# Patient Record
Sex: Male | Born: 1940
Health system: Southern US, Community
[De-identification: ages and names within clinical notes are randomized; demographics above are authoritative.]

## PROBLEM LIST (undated history)

## (undated) DIAGNOSIS — D573 Sickle-cell trait: Secondary | ICD-10-CM

## (undated) DIAGNOSIS — C61 Malignant neoplasm of prostate: Secondary | ICD-10-CM

## (undated) DIAGNOSIS — R42 Dizziness and giddiness: Secondary | ICD-10-CM

## (undated) DIAGNOSIS — I10 Essential (primary) hypertension: Secondary | ICD-10-CM

## (undated) HISTORY — PX: NECK SURGERY: SHX720

## (undated) HISTORY — PX: OTHER SURGICAL HISTORY: SHX169

## (undated) HISTORY — DX: Malignant neoplasm of prostate: C61

---

## 2005-06-14 ENCOUNTER — Ambulatory Visit: Payer: Self-pay | Admitting: Family Medicine

## 2005-06-14 ENCOUNTER — Inpatient Hospital Stay (HOSPITAL_COMMUNITY): Admission: EM | Admit: 2005-06-14 | Discharge: 2005-06-20 | Payer: Self-pay | Admitting: Emergency Medicine

## 2005-06-19 ENCOUNTER — Ambulatory Visit: Payer: Self-pay | Admitting: Internal Medicine

## 2005-06-22 ENCOUNTER — Ambulatory Visit: Payer: Self-pay | Admitting: Family Medicine

## 2005-06-26 ENCOUNTER — Ambulatory Visit: Payer: Self-pay | Admitting: *Deleted

## 2005-07-27 ENCOUNTER — Encounter: Admission: RE | Admit: 2005-07-27 | Discharge: 2005-07-27 | Payer: Self-pay | Admitting: Orthopedic Surgery

## 2006-01-15 ENCOUNTER — Ambulatory Visit: Payer: Self-pay | Admitting: Family Medicine

## 2006-01-16 ENCOUNTER — Emergency Department (HOSPITAL_COMMUNITY): Admission: EM | Admit: 2006-01-16 | Discharge: 2006-01-17 | Payer: Self-pay | Admitting: Emergency Medicine

## 2009-10-04 ENCOUNTER — Emergency Department (HOSPITAL_COMMUNITY): Admission: EM | Admit: 2009-10-04 | Discharge: 2009-10-04 | Payer: Self-pay | Admitting: Emergency Medicine

## 2010-09-08 NOTE — Consult Note (Signed)
NAME:  Carlos Harper, CHRISTMAS NO.:  [id number]   MEDICAL RECORD NO.:  [id number]          PATIENT TYPE:  INP   LOCATION:  1823                         FACILITY:  [facility]   PHYSICIAN:  Myrtie Neither, MD      DATE OF BIRTH:  May 17, 1940   DATE OF CONSULTATION:  06/14/2005  DATE OF DISCHARGE:                                   CONSULTATION   REASON FOR CONSULTATION:  Osteomyelitis, drainage from left lower leg.   PERTINENT HISTORY:  This is a 70 year old black male who had undergone ORIF  of the left distal tibia and fibula fracture 14 months ago in [place]. The  patient states that early on in his treatment he had problems with drainage  from a pinpoint area of his leg with dark discoloration. The patient states  he was never put on antibiotics during this period. He continues to have  recurrent  problems with the area breaking open with pain, swelling and  fever and drainage and attempted to clear up. The patient denies any fever,  chills or night sweats. The patient presently is in [place] and reported  to [facility] emergency room for treatment. The patient's last episode of  drainage was this past Monday.   PAST MEDICAL HISTORY:  1.  ORIF of the left tibial plateau 24 years ago.  2.  ORIF left tibia, fibula 14 months ago.  3.  No history of high blood pressure or diabetes.   ALLERGIES:  NONE KNOWN.   MEDICATIONS:  None.   SOCIAL HISTORY:  Negative. No use of alcohol or tobacco.   REVIEW OF SYSTEMS:  Basically just that of history of present illness. No  cardiac, respiratory, no urinary or bowel symptoms.   FAMILY HISTORY:  High blood pressure.   PHYSICAL EXAMINATION:  GENERAL: Alert and oriented, in no acute distress.  VITAL SIGNS: Temperature 97.3, pulse 63, respirations 20. Blood pressure  162/90.  EXTREMITIES: Left leg with old multiple scars laterally as well as  anteriorly. A tender pinpoint area wound which is sealed off but still  tender and slightly  puffy, dark discoloration, along the mid shaft in the  distal third of the tibia medially. Pulses intact. Negative Homans test.   X-ray revealed tibial plateau plate with multiple screws mid tibial shaft  with fifth screw from the top with ectopic bone over it and some periosteal  reaction to any area. Good callus formation with 1 slit at the fracture site  which has not healed in.   IMPRESSION:  Osteomyelitis draining wound left lower leg.   RECOMMENDATIONS:  It appears that the screw with ectopic bone may be keeping  the area open. Recommend removing the screw and doing culture and  sensitivity prior to antibiotics being given, to get a handle on which  bacteria is causing the infection.   PLAN:  Schedule patient for surgery tomorrow evening. He is to be n.p.o.  after breakfast. The patient also has not had a PSA or rectal exam in 15  years. We will order a PSA level and C-reactive protein.  Myrtie Neither, MD  Electronically Signed     AC/MEDQ  D:  06/14/2005  T:  06/15/2005  Job:  [id number]
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
## 2010-09-08 NOTE — Op Note (Signed)
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   POSTOPERATIVE DIAGNOSIS:  Osteomyelitis of left lower leg with hardware,  left lower leg.   OPERATION PERFORMED:  Incision and drainage, debridement, curettage and  removal of hardware right lower leg, culture and sensitivity aerobic and  anaerobic and Gram stains were done.   SURGEON:  Myrtie Neither, MD   ANESTHESIA:  General.   DESCRIPTION OF PROCEDURE:  The patient was taken to the operating room after  given adequate preop medication and given general anesthesia and intubated.  The right lower lobe was prepped with DuraPrep and draped in sterile manner.  Tourniquet used for hemostasis.  Anterolateral incision was made going  through the old previous scar of the left lower leg, down through the skin  and subcutaneous tissue.  Thick fibrotic scar was debrided from the area  down to the bone.  Plate was identified, multiple screws, 9 screws were  removed.  An area of excess bone formation noted on the x-ray was identified  and this was curetted and cultures were done.  After adequate debridement,  copious irrigation was done with Simpulse followed by use of antibiotic  solution.  Wound closure was then done with 2-0 Vicryl for the subcutaneous  and skin staples for the skin.  Bulky compressive dressing was applied.  Cam  walker applied.  The patient tolerated the procedure quite well and went to  the recovery room in stable satisfactory condition.      Myrtie Neither, MD  Electronically Signed     AC/MEDQ  D:  08/15/2005  T:   08/16/2005  Job:  [id number]
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
## 2010-09-08 NOTE — Op Note (Signed)
NAME:  Carlos Harper, Carlos Harper NO.:  [id number]   MEDICAL RECORD NO.:  [id number]          PATIENT TYPE:  INP   LOCATION:  5502                         FACILITY:  [facility]   PHYSICIAN:  Myrtie Neither, MD      DATE OF BIRTH:  04-Nov-1940   DATE OF PROCEDURE:  07/31/2005  DATE OF DISCHARGE:  06/20/2005                                 OPERATIVE REPORT   PREOPERATIVE DIAGNOSIS:  Osteomyelitis, left lower leg.  Hardware, left  lower leg.   POSTOPERATIVE DIAGNOSIS:  Osteomyelitis, left lower leg.  Hardware, left  lower leg.   OPERATION PERFORMED:  Incision, debridement and removal of hardware, left  lower leg.  Plate and screws.   SURGEON:  Myrtie Neither, MD   ANESTHESIA:   DESCRIPTION OF PROCEDURE:  The patient was taken to the operating room after  given adequate preop medications, given general anesthesia and intubated.  Right lower lobe was prepped with DuraPrep and draped in sterile manner.  Tourniquet was used for hemostasis.  Anterior incision going through the old  previous scar to right lower leg was made going through skin and  subcutaneous tissue down to the fascia.  Soft tissue was subperiosteally  elevated from the tibia and the plate.  Cultures of aerobic and anaerobic  were done.  Gram stain was done.  Tibial plate with multiple screws were  removed.  A portion of the tibia with ectopic bone at the small exit wound  through the skin was resected.  Examination of this area also demonstrated  what appeared to be either fine gravel or particle material.  This was  resected from the skin and the bone itself was curetted.  Copious and  abundant irrigation was done followed by closure with 0 Vicryl for the  fascia, 2-0 for the subcutaneous and skin staples for the skin.  Bulky  compressive dressing was applied.  Cam walker was applied.  The patient  tolerated the procedure well.  Went to recovery room in stable and  satisfactory condition.      Myrtie Neither,  MD  Electronically Signed     AC/MEDQ  D:  07/31/2005  T:  07/31/2005  Job:  [id number]

## 2015-05-13 ENCOUNTER — Encounter (HOSPITAL_COMMUNITY): Payer: Self-pay | Admitting: *Deleted

## 2015-05-13 ENCOUNTER — Emergency Department (HOSPITAL_COMMUNITY)
Admission: EM | Admit: 2015-05-13 | Discharge: 2015-05-13 | Disposition: A | Discharge status: Home / Self Care | Payer: Medicaid Other | Attending: Emergency Medicine | Admitting: Emergency Medicine

## 2015-05-13 ENCOUNTER — Emergency Department (HOSPITAL_COMMUNITY): Payer: Medicaid Other

## 2015-05-13 DIAGNOSIS — Z23 Encounter for immunization: Secondary | ICD-10-CM | POA: Diagnosis not present

## 2015-05-13 DIAGNOSIS — Y9289 Other specified places as the place of occurrence of the external cause: Secondary | ICD-10-CM | POA: Diagnosis not present

## 2015-05-13 DIAGNOSIS — Y998 Other external cause status: Secondary | ICD-10-CM | POA: Insufficient documentation

## 2015-05-13 DIAGNOSIS — S62645A Nondisplaced fracture of proximal phalanx of left ring finger, initial encounter for closed fracture: Secondary | ICD-10-CM | POA: Diagnosis not present

## 2015-05-13 DIAGNOSIS — S6992XA Unspecified injury of left wrist, hand and finger(s), initial encounter: Secondary | ICD-10-CM | POA: Diagnosis present

## 2015-05-13 DIAGNOSIS — S62607A Fracture of unspecified phalanx of left little finger, initial encounter for closed fracture: Secondary | ICD-10-CM

## 2015-05-13 DIAGNOSIS — S61213A Laceration without foreign body of left middle finger without damage to nail, initial encounter: Secondary | ICD-10-CM | POA: Insufficient documentation

## 2015-05-13 DIAGNOSIS — S62617A Displaced fracture of proximal phalanx of left little finger, initial encounter for closed fracture: Secondary | ICD-10-CM | POA: Diagnosis not present

## 2015-05-13 DIAGNOSIS — W208XXA Other cause of strike by thrown, projected or falling object, initial encounter: Secondary | ICD-10-CM | POA: Insufficient documentation

## 2015-05-13 DIAGNOSIS — S62635A Displaced fracture of distal phalanx of left ring finger, initial encounter for closed fracture: Secondary | ICD-10-CM

## 2015-05-13 DIAGNOSIS — Y9389 Activity, other specified: Secondary | ICD-10-CM | POA: Insufficient documentation

## 2015-05-13 DIAGNOSIS — S61219A Laceration without foreign body of unspecified finger without damage to nail, initial encounter: Secondary | ICD-10-CM

## 2015-05-13 DIAGNOSIS — IMO0001 Reserved for inherently not codable concepts without codable children: Secondary | ICD-10-CM

## 2015-05-13 HISTORY — DX: Dizziness and giddiness: R42

## 2015-05-13 MED ORDER — HYDROCODONE-ACETAMINOPHEN 5-325 MG PO TABS
1.0000 | ORAL_TABLET | Freq: Once | ORAL | Status: AC
  Administered 2015-05-13: 1 via ORAL
  Filled 2015-05-13: qty 1

## 2015-05-13 MED ORDER — LIDOCAINE HCL (PF) 2 % IJ SOLN
10.0000 mL | Freq: Once | INTRAMUSCULAR | Status: DC
  Filled 2015-05-13: qty 10

## 2015-05-13 MED ORDER — TETANUS-DIPHTH-ACELL PERTUSSIS 5-2.5-18.5 LF-MCG/0.5 IM SUSP
0.5000 mL | Freq: Once | INTRAMUSCULAR | Status: AC
  Administered 2015-05-13: 0.5 mL via INTRAMUSCULAR
  Filled 2015-05-13: qty 0.5

## 2015-05-13 MED ORDER — CEPHALEXIN 500 MG PO CAPS
500.0000 mg | ORAL_CAPSULE | Freq: Once | ORAL | Status: AC
  Administered 2015-05-13: 500 mg via ORAL
  Filled 2015-05-13: qty 1

## 2015-05-13 MED ORDER — POVIDONE-IODINE 10 % EX SOLN
CUTANEOUS | Status: AC
  Filled 2015-05-13: qty 118

## 2015-05-13 MED ORDER — HYDROCODONE-ACETAMINOPHEN 5-325 MG PO TABS: ORAL_TABLET | ORAL | Status: DC

## 2015-05-13 MED ORDER — CEPHALEXIN 500 MG PO CAPS: 500.0000 mg | ORAL_CAPSULE | Freq: Four times a day (QID) | ORAL | Status: DC

## 2015-05-13 NOTE — ED Notes (Signed)
Pt c/o left hand pain.

## 2015-05-13 NOTE — ED Provider Notes (Signed)
CSN: [id number]     Arrival date & time 05/13/15  [id number] History   First MD Initiated Contact with Patient 05/13/15 1950     Chief Complaint  Patient presents with  . Hand Pain     (Consider location/radiation/quality/duration/timing/severity/associated sxs/prior Treatment) HPI  Carlos Harper is a 75 y.o. male who presents to the Emergency Department complaining of pain and laceration tot eh left hand.  He states that he was cutting wood and had a tree limb to fall on his hand.  He complains of pain to the fourth and fifth fingers and a "cut" to the third finger.  Pain mainly to the fifth finger with attempted movement.  He states the fifth finger was initially "crooked" and he pulled on it, straightening his finger somewhat.  He states he has not required any pain medication for his symptoms.  He is unsure of last tetanus vaccination.  He denies other injuries, numbness or weakness of his hand, excessive bleeding, swelling, or anti-coagulant use.   Past Medical History  Diagnosis Date  . Vertigo    History reviewed. No pertinent past surgical history. History reviewed. No pertinent family history. Social History  Substance Use Topics  . Smoking status: Never Smoker   . Smokeless tobacco: None  . Alcohol Use: No    Review of Systems  Constitutional: Negative for fever and chills.  Musculoskeletal: Positive for arthralgias (left hand pain). Negative for joint swelling.  Skin: Positive for wound.       Laceration to the medial third finger  Neurological: Negative for dizziness, weakness and numbness.  Hematological: Does not bruise/bleed easily.  All other systems reviewed and are negative.     Allergies  Review of patient's allergies indicates no known allergies.  Home Medications   Prior to Admission medications   Not on File   BP 150/94 mmHg  Pulse 62  Temp(Src) 98.3 F (36.8 C) (Tympanic)  Resp 16  Ht 5\' 7"  (1.702 m)  Wt 77.111 kg  BMI 26.62 kg/m2  SpO2  98% Physical Exam  Constitutional: He is oriented to person, place, and time. He appears well-developed and well-nourished. No distress.  HENT:  Head: Normocephalic and atraumatic.  Cardiovascular: Normal rate, regular rhythm, normal heart sounds and intact distal pulses.   No murmur heard. Pulmonary/Chest: Effort normal and breath sounds normal. No respiratory distress.  Musculoskeletal: He exhibits tenderness. He exhibits no edema.  ttp of the proximal fourth and fifth fingers with proximal bony deformity of the fifth finger.  Limited ROM of the fifth finger.  Distal sensation intact, CR<2 sec, radial pulse brisk.  No tenderness proximal to the hand.    Neurological: He is alert and oriented to person, place, and time. He exhibits normal muscle tone. Coordination normal.  Skin: Skin is warm. Laceration noted.  4 cm Laceration to the medial third finger. Bleeding controlled, no edema.  Full ROM of the finger  Nursing note and vitals reviewed.   ED Course  ORTHOPEDIC INJURY TREATMENT Date/Time: 05/13/2015 8:30 PM Performed by: Kem Parkinson Authorized by: Kem Parkinson Consent: Verbal consent obtained. Risks and benefits: risks, benefits and alternatives were discussed Consent given by: patient Patient understanding: patient states understanding of the procedure being performed Imaging studies: imaging studies available Patient identity confirmed: verbally with patient and arm band Injury location: finger Location details: left little finger Injury type: fracture-dislocation Fracture type: proximal phalanx MCP joint involved: no IP joint involved: no Pre-procedure neurovascular assessment: neurovascularly intact Pre-procedure distal perfusion: normal Pre-procedure  neurological function: normal Pre-procedure range of motion: reduced Local anesthesia used: digital block. Patient sedated: no Manipulation performed: yes X-ray confirmed reduction: yes Immobilization:  splint Splint type: ulnar gutter Post-procedure neurovascular assessment: post-procedure neurovascularly intact Post-procedure distal perfusion: normal Post-procedure neurological function: normal Post-procedure range of motion: improved Patient tolerance: Patient tolerated the procedure well with no immediate complications   (including critical care time) Labs Review Labs Reviewed - No data to display  Imaging Review Dg Hand Complete Left  05/13/2015  CLINICAL DATA:  Left hand pain. EXAM: LEFT HAND - COMPLETE 3+ VIEW COMPARISON:  None. FINDINGS: There is a comminuted mildly impacted fracture of the midshaft of the proximal fifth phalanx. There is a 2 mm palmar displacement of the distal fracture fragment. A few mm lucency seen through the midshaft of the fourth proximal phalanx, which may represent a nondisplaced fracture. Soft tissue swelling is noted of the fourth and fifth digits proximally. IMPRESSION: Comminuted mildly displaced fracture of the fifth proximal phalanx. Possible nondisplaced fracture of the proximal fourth phalanx. Electronically Signed   By: Fidela Salisbury M.D.   On: 05/13/2015 19:52   Dg Finger Little Left  05/13/2015  CLINICAL DATA:  Postreduction EXAM: LEFT LITTLE FINGER 2+V COMPARISON:  Prior frame left hand same day FINDINGS: Postreduction there is anatomic alignment proximal phalanx left fifth finger. Again noted mild comminuted fracture. Stable nondisplaced fracture proximal phalanx fourth finger. There is a punctate metallic foreign body within soft tissue adjacent to middle phalanx fifth finger. IMPRESSION: Anatomic alignment of proximal phalanx left fifth finger. Again noted mild comminuted fracture of proximal phalanx. Stable nondisplaced fracture proximal phalanx fourth finger. There is a punctate metallic foreign body within soft tissue adjacent to middle phalanx fifth finger. Electronically Signed   By: Lahoma Crocker M.D.   On: 05/13/2015 21:24   I have  personally reviewed and evaluated these images and lab results as part of my medical decision-making.  LACERATION REPAIR Performed by: Annaliyah Willig L. Authorized by: Hale Bogus Consent: Verbal consent obtained. Risks and benefits: risks, benefits and alternatives were discussed Consent given by: patient Patient identity confirmed: provided demographic data Prepped and Draped in normal sterile fashion Wound explored  Laceration Location: medial third finger  Laceration Length:4 cm  No Foreign Bodies seen or palpated  Anesthesia: local infiltration  Local anesthetic: lidocaine 2% w/o epinephrine  Anesthetic total: 2 ml  Irrigation method: syringe Amount of cleaning: standard  Skin closure: 4-0 ethilon  Number of sutures: 6  Technique: simple interrupted  Patient tolerance: Patient tolerated the procedure well with no immediate complications.     MDM   Final diagnoses:  Laceration of finger, initial encounter  Fracture of fourth finger, distal phalanx, left, closed, initial encounter  Closed fracture of phalanx of fifth finger of left hand, initial encounter   Td updated.  Hemodynamically stable.    2115  Consulted Dr. Aline Brochure.  Recommended to splint finger in extension, ulnar gutter splint applied.  Pain improved. Anatomic alignment on post reduction film.  NV intact.  Pt also seen by Dr. Ashok Cordia and care plan discussed.      Dressing applied to laceration.  Wound care instructions given.  Pt agrees to close f/u with Dr. Aline Brochure.  Also advised to return here for any worsening sx's.    Kem Parkinson, PA-C 05/16/15 [place], MD 05/17/15 [id number]

## 2015-05-13 NOTE — Discharge Instructions (Signed)
Finger Fracture Fractures of fingers are breaks in the bones of the fingers. There are many types of fractures. There are different ways of treating these fractures. Your health care provider will discuss the best way to treat your fracture. CAUSES Traumatic injury is the main cause of broken fingers. These include:  Injuries while playing sports.  Workplace injuries.  Falls. RISK FACTORS Activities that can increase your risk of finger fractures include:  Sports.  Workplace activities that involve machinery.  A condition called osteoporosis, which can make your bones less dense and cause them to fracture more easily. SIGNS AND SYMPTOMS The main symptoms of a broken finger are pain and swelling within 15 minutes after the injury. Other symptoms include:  Bruising of your finger.  Stiffness of your finger.  Numbness of your finger.  Exposed bones (compound fracture) if the fracture is severe. DIAGNOSIS  The best way to diagnose a broken bone is with X-ray imaging. Additionally, your health care provider will use this X-ray image to evaluate the position of the broken finger bones.  TREATMENT  Finger fractures can be treated with:   Nonreduction--This means the bones are in place. The finger is splinted without changing the positions of the bone pieces. The splint is usually left on for about a week to 10 days. This will depend on your fracture and what your health care provider thinks.  Closed reduction--The bones are put back into position without using surgery. The finger is then splinted.  Open reduction and internal fixation--The fracture site is opened. Then the bone pieces are fixed into place with pins or some type of hardware. This is seldom required. It depends on the severity of the fracture. HOME CARE INSTRUCTIONS   Follow your health care provider's instructions regarding activities, exercises, and physical therapy.  Only take over-the-counter or prescription  medicines for pain, discomfort, or fever as directed by your health care provider. SEEK MEDICAL CARE IF: You have pain or swelling that limits the motion or use of your fingers. SEEK IMMEDIATE MEDICAL CARE IF:  Your finger becomes numb. MAKE SURE YOU:   Understand these instructions.  Will watch your condition.  Will get help right away if you are not doing well or get worse.   This information is not intended to replace advice given to you by your health care provider. Make sure you discuss any questions you have with your health care provider.   Document Released: 07/22/2000 Document Revised: 01/28/2013 Document Reviewed: 11/19/2012 Elsevier Interactive Patient Education 2016 [place] or Splint Care Casts and splints support injured limbs and keep bones from moving while they heal.  HOME CARE  Keep the cast or splint uncovered during the drying period.  A plaster cast can take 24 to 48 hours to dry.  A fiberglass cast will dry in less than 1 hour.  Do not rest the cast on anything harder than a pillow for 24 hours.  Do not put weight on your injured limb. Do not put pressure on the cast. Wait for your doctor's approval.  Keep the cast or splint dry.  Cover the cast or splint with a plastic bag during baths or wet weather.  If you have a cast over your chest and belly (trunk), take sponge baths until the cast is taken off.  If your cast gets wet, dry it with a towel or blow dryer. Use the cool setting on the blow dryer.  Keep your cast or splint clean. Wash a  dirty cast with a damp cloth.  Do not put any objects under your cast or splint.  Do not scratch the skin under the cast with an object. If itching is a problem, use a blow dryer on a cool setting over the itchy area.  Do not trim or cut your cast.  Do not take out the padding from inside your cast.  Exercise your joints near the cast as told by your doctor.  Raise (elevate) your injured limb on 1  or 2 pillows for the first 1 to 3 days. GET HELP IF:  Your cast or splint cracks.  Your cast or splint is too tight or too loose.  You itch badly under the cast.  Your cast gets wet or has a soft spot.  You have a bad smell coming from the cast.  You get an object stuck under the cast.  Your skin around the cast becomes red or sore.  You have new or more pain after the cast is put on. GET HELP RIGHT AWAY IF:  You have fluid leaking through the cast.  You cannot move your fingers or toes.  Your fingers or toes turn blue or white or are cool, painful, or puffy (swollen).  You have tingling or lose feeling (numbness) around the injured area.  You have bad pain or pressure under the cast.  You have trouble breathing or have shortness of breath.  You have chest pain.   This information is not intended to replace advice given to you by your health care provider. Make sure you discuss any questions you have with your health care provider.   Document Released: 08/09/2010 Document Revised: 12/10/2012 Document Reviewed: 10/16/2012 Elsevier Interactive Patient Education 2016 [place], Adult A laceration is a cut that goes through all layers of the skin. The cut also goes into the tissue that is right under the skin. Some cuts heal on their own. Others need to be closed with stitches (sutures), staples, skin adhesive strips, or wound glue. Taking care of your cut lowers your risk of infection and helps your cut to heal better. HOW TO TAKE CARE OF YOUR CUT For stitches or staples:  Keep the wound clean and dry.  If you were given a bandage (dressing), you should change it at least one time per day or as told by your doctor. You should also change it if it gets wet or dirty.  Keep the wound completely dry for the first 24 hours or as told by your doctor. After that time, you may take a shower or a bath. However, make sure that the wound is not soaked in water  until after the stitches or staples have been removed.  Clean the wound one time each day or as told by your doctor:  Wash the wound with soap and water.  Rinse the wound with water until all of the soap comes off.  Pat the wound dry with a clean towel. Do not rub the wound.  After you clean the wound, put a thin layer of antibiotic ointment on it as told by your doctor. This ointment:  Helps to prevent infection.  Keeps the bandage from sticking to the wound.  Have your stitches or staples removed as told by your doctor. If your doctor used skin adhesive strips:   Keep the wound clean and dry.  If you were given a bandage, you should change it at least one time per day or as told  by your doctor. You should also change it if it gets dirty or wet.  Do not get the skin adhesive strips wet. You can take a shower or a bath, but be careful to keep the wound dry.  If the wound gets wet, pat it dry with a clean towel. Do not rub the wound.  Skin adhesive strips fall off on their own. You can trim the strips as the wound heals. Do not remove any strips that are still stuck to the wound. They will fall off after a while. If your doctor used wound glue:  Try to keep your wound dry, but you may briefly wet it in the shower or bath. Do not soak the wound in water, such as by swimming.  After you take a shower or a bath, gently pat the wound dry with a clean towel. Do not rub the wound.  Do not do any activities that will make you really sweaty until the skin glue has fallen off on its own.  Do not apply liquid, cream, or ointment medicine to your wound while the skin glue is still on.  If you were given a bandage, you should change it at least one time per day or as told by your doctor. You should also change it if it gets dirty or wet.  If a bandage is placed over the wound, do not let the tape for the bandage touch the skin glue.  Do not pick at the glue. The skin glue usually stays  on for 5-10 days. Then, it falls off of the skin. General Instructions  To help prevent scarring, make sure to cover your wound with sunscreen whenever you are outside after stitches are removed, after adhesive strips are removed, or when wound glue stays in place and the wound is healed. Make sure to wear a sunscreen of at least 30 SPF.  Take over-the-counter and prescription medicines only as told by your doctor.  If you were given antibiotic medicine or ointment, take or apply it as told by your doctor. Do not stop using the antibiotic even if your wound is getting better.  Do not scratch or pick at the wound.  Keep all follow-up visits as told by your doctor. This is important.  Check your wound every day for signs of infection. Watch for:  Redness, swelling, or pain.  Fluid, blood, or pus.  Raise (elevate) the injured area above the level of your heart while you are sitting or lying down, if possible. GET HELP IF:  You got a tetanus shot and you have any of these problems at the injection site:  Swelling.  Very bad pain.  Redness.  Bleeding.  You have a fever.  A wound that was closed breaks open.  You notice a bad smell coming from your wound or your bandage.  You notice something coming out of the wound, such as wood or glass.  Medicine does not help your pain.  You have more redness, swelling, or pain at the site of your wound.  You have fluid, blood, or pus coming from your wound.  You notice a change in the color of your skin near your wound.  You need to change the bandage often because fluid, blood, or pus is coming from the wound.  You start to have a new rash.  You start to have numbness around the wound. GET HELP RIGHT AWAY IF:  You have very bad swelling around the wound.  Your pain suddenly  gets worse and is very bad.  You notice painful lumps near the wound or on skin that is anywhere on your body.  You have a red streak going away from  your wound.  The wound is on your hand or foot and you cannot move a finger or toe like you usually can.  The wound is on your hand or foot and you notice that your fingers or toes look pale or bluish.   This information is not intended to replace advice given to you by your health care provider. Make sure you discuss any questions you have with your health care provider.   Document Released: 09/26/2007 Document Revised: 08/24/2014 Document Reviewed: 04/05/2014 Elsevier Interactive Patient Education [employer].

## 2015-05-16 ENCOUNTER — Telehealth: Payer: Self-pay | Admitting: Orthopedic Surgery

## 2015-05-16 NOTE — Telephone Encounter (Signed)
Call received from patient following Emergency Room visit 05/13/15 for left hand injury/finger fracture.  Discussed appointment scheduling; pending referral from primary care provider, [facility], referral, per patient's insurance requirement.  Patient to call to request referral. Phone number is [phone].

## 2015-05-18 ENCOUNTER — Encounter (HOSPITAL_COMMUNITY): Payer: Self-pay | Admitting: Emergency Medicine

## 2015-05-18 ENCOUNTER — Emergency Department (HOSPITAL_COMMUNITY)
Admission: EM | Admit: 2015-05-18 | Discharge: 2015-05-18 | Disposition: A | Discharge status: Home / Self Care | Payer: Medicaid Other | Attending: Emergency Medicine | Admitting: Emergency Medicine

## 2015-05-18 DIAGNOSIS — Z5189 Encounter for other specified aftercare: Secondary | ICD-10-CM

## 2015-05-18 DIAGNOSIS — M79644 Pain in right finger(s): Secondary | ICD-10-CM | POA: Insufficient documentation

## 2015-05-18 DIAGNOSIS — Z4801 Encounter for change or removal of surgical wound dressing: Secondary | ICD-10-CM | POA: Diagnosis present

## 2015-05-18 NOTE — Discharge Instructions (Signed)
Your laceration is healing well.  The sutures will need to be removed in 5 days.

## 2015-05-18 NOTE — ED Notes (Signed)
Pt here for a follow up for a fracture to LT arm. Pt unable to see orthopedic doctor due to issues with insurance. Pt has a splint on LT arm. Pt has no other complaints.

## 2015-05-18 NOTE — ED Notes (Signed)
Replaced ulnar gutter wrapping and bandage. Patient tolerated well.

## 2015-05-18 NOTE — ED Provider Notes (Signed)
CSN: [id number]     Arrival date & time 05/18/15  1639 History   First MD Initiated Contact with Patient 05/18/15 1700     Chief Complaint  Patient presents with  . Follow-up     (Consider location/radiation/quality/duration/timing/severity/associated sxs/prior Treatment) The history is provided by the patient and a relative.   Carlos Harper is a 75 y.o. male who was seen here 5 days ago and diagnosed with fractures at the base of his right 4th and 5th fingers, and sustained a laceration to his volar 3rd finger of this hand, presenting for recheck.  He denies any new or worsening symptoms but has had difficulty getting in to see the orthopedist, stating "they won't see me because I need a referral from my primary doctor".  He denies having a pcp, stating he last saw a doctor in 1982.  Family member at the bedside states the health dept is listed on his medicaid card.  He denies any worsened pain in the fingers and denies any drainage or swelling from the laceration site.    Past Medical History  Diagnosis Date  . Vertigo    History reviewed. No pertinent past surgical history. No family history on file. Social History  Substance Use Topics  . Smoking status: Never Smoker   . Smokeless tobacco: None  . Alcohol Use: No    Review of Systems  Constitutional: Negative for fever.  Musculoskeletal: Positive for arthralgias. Negative for myalgias.  Skin: Positive for wound.  Neurological: Negative for weakness and numbness.      Allergies  Review of patient's allergies indicates no known allergies.  Home Medications   Prior to Admission medications   Medication Sig Start Date End Date Taking? Authorizing Provider  cephALEXin (KEFLEX) 500 MG capsule Take 1 capsule (500 mg total) by mouth 4 (four) times daily. For 7 days 05/13/15   Kem Parkinson, PA-C  HYDROcodone-acetaminophen (NORCO/VICODIN) 5-325 MG tablet Take one-two tabs po q 4-6 hrs prn pain 05/13/15   Tammy Triplett, PA-C     BP 166/80 mmHg  Pulse 77  Temp(Src) 98.7 F (37.1 C) (Oral)  Resp 18  Ht 5\' 7"  (1.702 m)  Wt 77.111 kg  BMI 26.62 kg/m2  SpO2 100% Physical Exam  Constitutional: He appears well-developed and well-nourished.  HENT:  Head: Atraumatic.  Neck: Normal range of motion.  Cardiovascular:  Pulses equal bilaterally  Musculoskeletal: He exhibits tenderness.       Hands: Mild ttp right base of fifth finger.  Distal sensation intact.  Examined after removal of splint.   Neurological: He is alert. He has normal strength. He displays normal reflexes. No sensory deficit.  Skin: Skin is warm and dry.  Well healing laceration right volar 3rd finger.  No drainage, no swelling or redness,  Sutures intact.  Psychiatric: He has a normal mood and affect.    ED Course  Procedures (including critical care time) Labs Review Labs Reviewed - No data to display  Imaging Review No results found. I have personally reviewed and evaluated these images and lab results as part of my medical decision-making.   EKG Interpretation None      MDM   Final diagnoses:  Encounter for wound re-check    Advised pt he will need to get a provider at the health department to approve the referral to Dr. Aline Brochure, as this is a Medicaid rule.  Family member at bedside will call in the am to get this arranged.   Patient was seen by  Dr. Wyvonnia Dusky prior to discharge home.  He was placed in a new Ace wrap, former splint used.  Advised that his stitches will need to come out in 5 days.    Evalee Jefferson, PA-C 05/18/15 1758  Ezequiel Essex, MD 05/19/15 0930

## 2015-06-01 NOTE — Telephone Encounter (Signed)
Called back to patient to follow up; reached phone auto message "not available."

## 2015-06-09 NOTE — Telephone Encounter (Signed)
[facility] at this primary care office. Patient aware.

## 2015-06-10 ENCOUNTER — Ambulatory Visit (INDEPENDENT_AMBULATORY_CARE_PROVIDER_SITE_OTHER): Payer: Medicaid Other | Admitting: Orthopedic Surgery

## 2015-06-10 ENCOUNTER — Ambulatory Visit (INDEPENDENT_AMBULATORY_CARE_PROVIDER_SITE_OTHER): Payer: Medicaid Other

## 2015-06-10 VITALS — BP 133/68 | Ht 67.0 in | Wt 170.0 lb

## 2015-06-10 DIAGNOSIS — S62609D Fracture of unspecified phalanx of unspecified finger, subsequent encounter for fracture with routine healing: Secondary | ICD-10-CM | POA: Diagnosis not present
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
## 2015-07-22 ENCOUNTER — Ambulatory Visit (INDEPENDENT_AMBULATORY_CARE_PROVIDER_SITE_OTHER): Payer: Medicaid Other | Admitting: Orthopedic Surgery

## 2015-07-22 ENCOUNTER — Encounter: Payer: Self-pay | Admitting: Orthopedic Surgery

## 2015-07-22 VITALS — BP 142/74 | Ht 67.0 in | Wt 173.0 lb

## 2015-07-22 DIAGNOSIS — S62609D Fracture of unspecified phalanx of unspecified finger, subsequent encounter for fracture with routine healing: Secondary | ICD-10-CM

## 2015-07-22 NOTE — Patient Instructions (Addendum)
Call OT at [facility] to schedule therapy appointments [phone]
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
## 2015-08-09 ENCOUNTER — Ambulatory Visit (HOSPITAL_COMMUNITY): Payer: Medicaid Other

## 2015-08-12 ENCOUNTER — Telehealth (HOSPITAL_COMMUNITY): Payer: Self-pay

## 2015-08-12 NOTE — Telephone Encounter (Signed)
Death in family and will be leaving town on Monday.

## 2015-08-15 ENCOUNTER — Ambulatory Visit (HOSPITAL_COMMUNITY): Payer: Medicaid Other

## 2015-09-02 ENCOUNTER — Ambulatory Visit: Payer: Medicaid Other | Admitting: Orthopedic Surgery

## 2017-08-20 ENCOUNTER — Encounter: Payer: Self-pay | Admitting: Hematology

## 2017-09-02 ENCOUNTER — Other Ambulatory Visit: Payer: Self-pay | Admitting: Urology

## 2017-09-02 DIAGNOSIS — C61 Malignant neoplasm of prostate: Secondary | ICD-10-CM

## 2017-09-12 ENCOUNTER — Encounter (HOSPITAL_COMMUNITY)
Admission: RE | Admit: 2017-09-12 | Discharge: 2017-09-12 | Disposition: A | Discharge status: Home / Self Care | Payer: Medicaid Other | Source: Ambulatory Visit | Attending: Urology | Admitting: Urology

## 2017-09-12 DIAGNOSIS — C61 Malignant neoplasm of prostate: Secondary | ICD-10-CM

## 2017-09-12 MED ORDER — TECHNETIUM TC 99M MEDRONATE IV KIT
21.7000 | PACK | Freq: Once | INTRAVENOUS | Status: AC | PRN
  Administered 2017-09-12: 21.7 via INTRAVENOUS

## 2017-09-13 ENCOUNTER — Encounter (HOSPITAL_COMMUNITY): Payer: Self-pay | Admitting: *Deleted

## 2017-09-13 ENCOUNTER — Ambulatory Visit (INDEPENDENT_AMBULATORY_CARE_PROVIDER_SITE_OTHER): Payer: Medicaid Other | Admitting: Urology

## 2017-09-13 ENCOUNTER — Other Ambulatory Visit: Payer: Self-pay | Admitting: Urology

## 2017-09-13 ENCOUNTER — Other Ambulatory Visit: Payer: Self-pay

## 2017-09-13 DIAGNOSIS — N130 Hydronephrosis with ureteropelvic junction obstruction: Secondary | ICD-10-CM

## 2017-09-13 DIAGNOSIS — C61 Malignant neoplasm of prostate: Secondary | ICD-10-CM

## 2017-09-13 DIAGNOSIS — C6951 Malignant neoplasm of right lacrimal gland and duct: Secondary | ICD-10-CM | POA: Diagnosis not present

## 2017-09-13 DIAGNOSIS — C778 Secondary and unspecified malignant neoplasm of lymph nodes of multiple regions: Secondary | ICD-10-CM | POA: Diagnosis not present

## 2017-09-13 DIAGNOSIS — R972 Elevated prostate specific antigen [PSA]: Secondary | ICD-10-CM | POA: Diagnosis not present

## 2017-09-13 NOTE — Progress Notes (Signed)
Called and left voice mail message for Triage at [facility] where daugther stated patient had recent 12 lead ekg done.  Requested 12 lead ekg with interpretation and tracing be faxed to [phone] with call back number of [phone].
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
## 2017-09-17 ENCOUNTER — Ambulatory Visit (HOSPITAL_COMMUNITY)
Admission: RE | Admit: 2017-09-17 | Discharge: 2017-09-17 | Disposition: A | Discharge status: Home / Self Care | Payer: Medicaid Other | Source: Ambulatory Visit | Attending: Urology | Admitting: Urology

## 2017-09-17 ENCOUNTER — Ambulatory Visit (HOSPITAL_COMMUNITY): Payer: Medicaid Other | Admitting: Anesthesiology

## 2017-09-17 ENCOUNTER — Encounter (HOSPITAL_COMMUNITY): Payer: Self-pay | Admitting: *Deleted

## 2017-09-17 ENCOUNTER — Encounter (HOSPITAL_COMMUNITY)
Admission: RE | Disposition: A | Discharge status: Home / Self Care | Payer: Self-pay | Source: Ambulatory Visit | Attending: Urology

## 2017-09-17 ENCOUNTER — Other Ambulatory Visit: Payer: Self-pay

## 2017-09-17 ENCOUNTER — Ambulatory Visit (HOSPITAL_COMMUNITY): Payer: Medicaid Other

## 2017-09-17 DIAGNOSIS — N131 Hydronephrosis with ureteral stricture, not elsewhere classified: Secondary | ICD-10-CM | POA: Insufficient documentation

## 2017-09-17 DIAGNOSIS — R972 Elevated prostate specific antigen [PSA]: Secondary | ICD-10-CM | POA: Diagnosis present

## 2017-09-17 DIAGNOSIS — C7951 Secondary malignant neoplasm of bone: Secondary | ICD-10-CM | POA: Insufficient documentation

## 2017-09-17 DIAGNOSIS — R591 Generalized enlarged lymph nodes: Secondary | ICD-10-CM | POA: Insufficient documentation

## 2017-09-17 DIAGNOSIS — C61 Malignant neoplasm of prostate: Secondary | ICD-10-CM

## 2017-09-17 DIAGNOSIS — N500 Atrophy of testis: Secondary | ICD-10-CM | POA: Diagnosis not present

## 2017-09-17 DIAGNOSIS — N4611 Organic oligospermia: Secondary | ICD-10-CM | POA: Diagnosis not present

## 2017-09-17 DIAGNOSIS — Z801 Family history of malignant neoplasm of trachea, bronchus and lung: Secondary | ICD-10-CM | POA: Diagnosis not present

## 2017-09-17 DIAGNOSIS — I10 Essential (primary) hypertension: Secondary | ICD-10-CM | POA: Diagnosis not present

## 2017-09-17 HISTORY — DX: Essential (primary) hypertension: I10

## 2017-09-17 HISTORY — PX: ORCHIECTOMY: SHX2116

## 2017-09-17 HISTORY — PX: PROSTATE BIOPSY: SHX241

## 2017-09-17 HISTORY — DX: Sickle-cell trait: D57.3

## 2017-09-17 HISTORY — PX: CYSTOSCOPY WITH FULGERATION: SHX6638

## 2017-09-17 HISTORY — PX: CYSTOSCOPY WITH STENT PLACEMENT: SHX5790

## 2017-09-17 SURGERY — BIOPSY, PROSTATE, RECTAL APPROACH, WITH US GUIDANCE
Anesthesia: General | Laterality: Right

## 2017-09-17 MED ORDER — ACETAMINOPHEN 650 MG RE SUPP
650.0000 mg | RECTAL | Status: DC | PRN
  Filled 2017-09-17: qty 1

## 2017-09-17 MED ORDER — CEFAZOLIN SODIUM-DEXTROSE 2-4 GM/100ML-% IV SOLN
2.0000 g | INTRAVENOUS | Status: AC
  Administered 2017-09-17: 2 g via INTRAVENOUS
  Filled 2017-09-17: qty 100

## 2017-09-17 MED ORDER — ONDANSETRON HCL 4 MG/2ML IJ SOLN
INTRAMUSCULAR | Status: DC | PRN
  Administered 2017-09-17: 4 mg via INTRAVENOUS

## 2017-09-17 MED ORDER — OXYCODONE HCL 5 MG PO TABS: 5.0000 mg | ORAL_TABLET | ORAL | Status: DC | PRN

## 2017-09-17 MED ORDER — STERILE WATER FOR IRRIGATION IR SOLN
Status: DC | PRN
  Administered 2017-09-17: 3000 mL

## 2017-09-17 MED ORDER — LACTATED RINGERS IV SOLN
INTRAVENOUS | Status: DC
  Administered 2017-09-17 (×2): via INTRAVENOUS

## 2017-09-17 MED ORDER — EPHEDRINE 5 MG/ML INJ
INTRAVENOUS | Status: AC
  Filled 2017-09-17: qty 10

## 2017-09-17 MED ORDER — MORPHINE SULFATE (PF) 4 MG/ML IV SOLN: 2.0000 mg | INTRAVENOUS | Status: DC | PRN

## 2017-09-17 MED ORDER — EPHEDRINE SULFATE-NACL 50-0.9 MG/10ML-% IV SOSY
PREFILLED_SYRINGE | INTRAVENOUS | Status: DC | PRN
  Administered 2017-09-17: 5 mg via INTRAVENOUS

## 2017-09-17 MED ORDER — DEXAMETHASONE SODIUM PHOSPHATE 10 MG/ML IJ SOLN
INTRAMUSCULAR | Status: AC
  Filled 2017-09-17: qty 1

## 2017-09-17 MED ORDER — FENTANYL CITRATE (PF) 100 MCG/2ML IJ SOLN
INTRAMUSCULAR | Status: AC
  Filled 2017-09-17: qty 2

## 2017-09-17 MED ORDER — PROMETHAZINE HCL 25 MG/ML IJ SOLN: 6.2500 mg | INTRAMUSCULAR | Status: DC | PRN

## 2017-09-17 MED ORDER — LIDOCAINE 2% (20 MG/ML) 5 ML SYRINGE
INTRAMUSCULAR | Status: AC
  Filled 2017-09-17: qty 5

## 2017-09-17 MED ORDER — HYDROCODONE-ACETAMINOPHEN 5-325 MG PO TABS
1.0000 | ORAL_TABLET | Freq: Once | ORAL | Status: AC
  Administered 2017-09-17: 1 via ORAL

## 2017-09-17 MED ORDER — LACTATED RINGERS IV SOLN: INTRAVENOUS | Status: DC

## 2017-09-17 MED ORDER — SODIUM CHLORIDE 0.9 % IR SOLN
Status: DC | PRN
  Administered 2017-09-17: 6000 mL

## 2017-09-17 MED ORDER — ACETAMINOPHEN 325 MG PO TABS: 650.0000 mg | ORAL_TABLET | ORAL | Status: DC | PRN

## 2017-09-17 MED ORDER — BUPIVACAINE HCL (PF) 0.25 % IJ SOLN
INTRAMUSCULAR | Status: AC
Start: 2017-09-17 — End: ?
  Filled 2017-09-17: qty 30

## 2017-09-17 MED ORDER — PROPOFOL 10 MG/ML IV BOLUS
INTRAVENOUS | Status: AC
Start: 2017-09-17 — End: ?
  Filled 2017-09-17: qty 20

## 2017-09-17 MED ORDER — 0.9 % SODIUM CHLORIDE (POUR BTL) OPTIME
TOPICAL | Status: DC | PRN
  Administered 2017-09-17: 1000 mL

## 2017-09-17 MED ORDER — LIDOCAINE 2% (20 MG/ML) 5 ML SYRINGE
INTRAMUSCULAR | Status: DC | PRN
  Administered 2017-09-17: 100 mg via INTRAVENOUS

## 2017-09-17 MED ORDER — GENTAMICIN SULFATE 40 MG/ML IJ SOLN
5.0000 mg/kg | Freq: Once | INTRAVENOUS | Status: AC
  Administered 2017-09-17: 340 mg via INTRAVENOUS
  Filled 2017-09-17: qty 8.5

## 2017-09-17 MED ORDER — IOHEXOL 300 MG/ML  SOLN
INTRAMUSCULAR | Status: DC | PRN
  Administered 2017-09-17: 20 mL via URETHRAL

## 2017-09-17 MED ORDER — ONDANSETRON HCL 4 MG/2ML IJ SOLN
INTRAMUSCULAR | Status: AC
  Filled 2017-09-17: qty 2

## 2017-09-17 MED ORDER — SODIUM CHLORIDE 0.9% FLUSH: 3.0000 mL | Freq: Two times a day (BID) | INTRAVENOUS | Status: DC

## 2017-09-17 MED ORDER — SODIUM CHLORIDE 0.9 % IV SOLN: 250.0000 mL | INTRAVENOUS | Status: DC | PRN

## 2017-09-17 MED ORDER — FENTANYL CITRATE (PF) 100 MCG/2ML IJ SOLN
INTRAMUSCULAR | Status: DC | PRN
  Administered 2017-09-17: 25 ug via INTRAVENOUS
  Administered 2017-09-17: 50 ug via INTRAVENOUS
  Administered 2017-09-17: 25 ug via INTRAVENOUS

## 2017-09-17 MED ORDER — DEXAMETHASONE SODIUM PHOSPHATE 10 MG/ML IJ SOLN
INTRAMUSCULAR | Status: DC | PRN
  Administered 2017-09-17: 10 mg via INTRAVENOUS

## 2017-09-17 MED ORDER — FENTANYL CITRATE (PF) 100 MCG/2ML IJ SOLN: 25.0000 ug | INTRAMUSCULAR | Status: DC | PRN

## 2017-09-17 MED ORDER — BUPIVACAINE HCL (PF) 0.25 % IJ SOLN
INTRAMUSCULAR | Status: AC
  Filled 2017-09-17: qty 30

## 2017-09-17 MED ORDER — HYDROCODONE-ACETAMINOPHEN 5-325 MG PO TABS: ORAL_TABLET | ORAL | 0 refills | Status: DC

## 2017-09-17 MED ORDER — HYDROCODONE-ACETAMINOPHEN 5-325 MG PO TABS
ORAL_TABLET | ORAL | Status: AC
  Filled 2017-09-17: qty 1

## 2017-09-17 MED ORDER — SODIUM CHLORIDE 0.9% FLUSH: 3.0000 mL | INTRAVENOUS | Status: DC | PRN

## 2017-09-17 MED ORDER — BUPIVACAINE HCL 0.25 % IJ SOLN
INTRAMUSCULAR | Status: DC | PRN
  Administered 2017-09-17: 10 mL

## 2017-09-17 MED ORDER — LIDOCAINE HCL URETHRAL/MUCOSAL 2 % EX GEL
CUTANEOUS | Status: AC
Start: 2017-09-17 — End: ?
  Filled 2017-09-17: qty 5

## 2017-09-17 MED ORDER — PROPOFOL 10 MG/ML IV BOLUS
INTRAVENOUS | Status: DC | PRN
  Administered 2017-09-17: 170 mg via INTRAVENOUS

## 2017-09-17 MED ORDER — MEPERIDINE HCL 50 MG/ML IJ SOLN: 6.2500 mg | INTRAMUSCULAR | Status: DC | PRN

## 2017-09-17 SURGICAL SUPPLY — 58 items
ADH SKN CLS APL DERMABOND .7 (GAUZE/BANDAGES/DRESSINGS) ×4
APL SKNCLS STERI-STRIP NONHPOA (GAUZE/BANDAGES/DRESSINGS)
BAG URO CATCHER STRL LF (MISCELLANEOUS) ×6 IMPLANT
BENZOIN TINCTURE PRP APPL 2/3 (GAUZE/BANDAGES/DRESSINGS) ×4 IMPLANT
BLADE CLIPPER SENSICLIP SURGIC (BLADE) ×2 IMPLANT
BLADE HEX COATED 2.75 (ELECTRODE) ×4 IMPLANT
BNDG GAUZE ELAST 4 BULKY (GAUZE/BANDAGES/DRESSINGS) ×8 IMPLANT
CATH URET 5FR 28IN OPEN ENDED (CATHETERS) ×2 IMPLANT
CLOTH BEACON ORANGE TIMEOUT ST (SAFETY) ×6 IMPLANT
COUNTER NEEDLE 20 DBL MAG RED (NEEDLE) ×2 IMPLANT
COVER FOOTSWITCH UNIV (MISCELLANEOUS) ×2 IMPLANT
COVER SURGICAL LIGHT HANDLE (MISCELLANEOUS) ×6 IMPLANT
DECANTER SPIKE VIAL GLASS SM (MISCELLANEOUS) ×4 IMPLANT
DERMABOND ADVANCED (GAUZE/BANDAGES/DRESSINGS) ×2
DERMABOND ADVANCED .7 DNX12 (GAUZE/BANDAGES/DRESSINGS) IMPLANT
DRAIN PENROSE 18X1/2 LTX STRL (DRAIN) ×4 IMPLANT
DRAIN PENROSE 18X1/4 LTX STRL (WOUND CARE) ×4 IMPLANT
DRAPE LAPAROTOMY T 98X78 PEDS (DRAPES) ×2 IMPLANT
DRSG TELFA 3X8 NADH (GAUZE/BANDAGES/DRESSINGS) ×6 IMPLANT
ELECT REM PT RETURN 15FT ADLT (MISCELLANEOUS) ×6 IMPLANT
GAUZE SPONGE 4X4 12PLY STRL (GAUZE/BANDAGES/DRESSINGS) ×6 IMPLANT
GLOVE BIOGEL PI IND STRL 7.0 (GLOVE) IMPLANT
GLOVE BIOGEL PI INDICATOR 7.0 (GLOVE) ×4
GLOVE SURG SS PI 8.0 STRL IVOR (GLOVE) ×2 IMPLANT
GOWN STRL REUS W/TWL XL LVL3 (GOWN DISPOSABLE) ×8 IMPLANT
GUIDEWIRE ANG ZIPWIRE 038X150 (WIRE) ×2 IMPLANT
GUIDEWIRE STR DUAL SENSOR (WIRE) ×6 IMPLANT
INST BIOPSY MAXCORE 18GX25 (NEEDLE) IMPLANT
INSTR BIOPSY MAXCORE 18GX20 (NEEDLE) ×2 IMPLANT
KIT BASIN OR (CUSTOM PROCEDURE TRAY) ×4 IMPLANT
KIT RM TURNOVER CYSTO AR (KITS) ×6 IMPLANT
MANIFOLD NEPTUNE II (INSTRUMENTS) ×6 IMPLANT
NDL SAFETY ECLIPSE 18X1.5 (NEEDLE) IMPLANT
NDL SPNL 22GX7 QUINCKE BK (NEEDLE) IMPLANT
NEEDLE HYPO 18GX1.5 SHARP (NEEDLE) ×6
NEEDLE HYPO 22GX1.5 SAFETY (NEEDLE) ×2 IMPLANT
NEEDLE SPNL 22GX7 QUINCKE BK (NEEDLE) IMPLANT
NS IRRIG 1000ML POUR BTL (IV SOLUTION) IMPLANT
PACK CYSTO (CUSTOM PROCEDURE TRAY) ×6 IMPLANT
PACK GENERAL/GYN (CUSTOM PROCEDURE TRAY) ×4 IMPLANT
PAD DRESSING TELFA 3X8 NADH (GAUZE/BANDAGES/DRESSINGS) ×4 IMPLANT
SPONGE LAP 4X18 RFD (DISPOSABLE) ×8 IMPLANT
SUPPORT SCROTAL LG STRP (MISCELLANEOUS) ×5 IMPLANT
SUPPORTER ATHLETIC LG (MISCELLANEOUS) ×1
SURGILUBE 2OZ TUBE FLIPTOP (MISCELLANEOUS) ×4 IMPLANT
SUT CHROMIC 3 0 SH 27 (SUTURE) ×12 IMPLANT
SUT CHROMIC 4 0 SH 27 (SUTURE) IMPLANT
SUT VIC AB 3-0 SH 27 (SUTURE)
SUT VIC AB 3-0 SH 27XBRD (SUTURE) ×4 IMPLANT
SUT VICRYL 0 TIES 12 18 (SUTURE) ×8 IMPLANT
SYR CONTROL 10ML LL (SYRINGE) ×2 IMPLANT
TOWEL OR 17X24 6PK STRL BLUE (TOWEL DISPOSABLE) ×4 IMPLANT
TOWEL OR NON WOVEN STRL DISP B (DISPOSABLE) ×2 IMPLANT
TUBING CONNECTING 10 (TUBING) ×5 IMPLANT
TUBING CONNECTING 10' (TUBING) ×1
TUBING UROLOGY SET (TUBING) ×2 IMPLANT
UNDERPAD 30X30 INCONTINENT (UNDERPADS AND DIAPERS) ×4 IMPLANT
WATER STERILE IRR 1000ML POUR (IV SOLUTION) IMPLANT

## 2017-09-17 NOTE — Anesthesia Procedure Notes (Signed)
Procedure Name: LMA Insertion Date/Time: 09/17/2017 4:09 PM Performed by: Sharlette Dense, CRNA Patient Re-evaluated:Patient Re-evaluated prior to induction Oxygen Delivery Method: Circle system utilized Preoxygenation: Pre-oxygenation with 100% oxygen Induction Type: IV induction LMA: LMA inserted LMA Size: 4.0 Number of attempts: 2 Placement Confirmation: positive ETCO2 and breath sounds checked- equal and bilateral Tube secured with: Tape Dental Injury: Teeth and Oropharynx as per pre-operative assessment

## 2017-09-17 NOTE — Anesthesia Postprocedure Evaluation (Signed)
Anesthesia Post Note  Patient: Carlos Harper  Procedure(s) Performed: BIOPSY TRANSRECTAL ULTRASONIC PROSTATE (TUBP) (N/A ) ORCHIECTOMY (Bilateral ) CYSTOSCOPY WITH RIGHT RETROGRADE PYELOGRAM ATTEMPTED STENT PLACEMENT (Right ) CYSTOSCOPY WITH FULGERATION OF BLADDER NECK     Patient location during evaluation: PACU Anesthesia Type: General Level of consciousness: awake and alert Pain management: pain level controlled Vital Signs Assessment: post-procedure vital signs reviewed and stable Respiratory status: spontaneous breathing, nonlabored ventilation, respiratory function stable and patient connected to nasal cannula oxygen Cardiovascular status: blood pressure returned to baseline and stable Postop Assessment: no apparent nausea or vomiting Anesthetic complications: no    Last Vitals:  Vitals:   09/17/17 1749 09/17/17 1800  BP: 139/81 (!) 142/82  Pulse: 79 82  Resp: 16 13  Temp: 36.9 C   SpO2: 100% 95%    Last Pain:  Vitals:   09/17/17 1800  TempSrc:   PainSc: 0-No pain                 Effie Berkshire

## 2017-09-17 NOTE — Interval H&P Note (Signed)
History and Physical Interval Note:  After further discussion, he has elected to proceed with orchiectomy.   09/17/2017 3:53 PM  Carlos Harper  has presented today for surgery, with the diagnosis of PROSTATE CANCER  The various methods of treatment have been discussed with the patient and family. After consideration of risks, benefits and other options for treatment, the patient has consented to  Procedure(s): BIOPSY TRANSRECTAL ULTRASONIC PROSTATE (TUBP) (N/A) ORCHIECTOMY (Bilateral) CYSTOSCOPY WITH STENT PLACEMENT (Right) as a surgical intervention .  The patient's history has been reviewed, patient examined, no change in status, stable for surgery.  I have reviewed the patient's chart and labs.  Questions were answered to the patient's satisfaction.     [name]

## 2017-09-17 NOTE — Transfer of Care (Signed)
Immediate Anesthesia Transfer of Care Note  Patient: Carlos Harper  Procedure(s) Performed: BIOPSY TRANSRECTAL ULTRASONIC PROSTATE (TUBP) (N/A ) ORCHIECTOMY (Bilateral ) CYSTOSCOPY WITH RIGHT RETROGRADE PYELOGRAM ATTEMPTED STENT PLACEMENT (Right ) CYSTOSCOPY WITH FULGERATION OF BLADDER NECK  Patient Location: PACU  Anesthesia Type:General  Level of Consciousness: sedated  Airway & Oxygen Therapy: Patient Spontanous Breathing and Patient connected to face mask oxygen  Post-op Assessment: Report given to RN and Post -op Vital signs reviewed and stable  Post vital signs: Reviewed and stable  Last Vitals:  Vitals Value Taken Time  BP 139/81 09/17/2017  5:49 PM  Temp    Pulse 80 09/17/2017  5:50 PM  Resp 13 09/17/2017  5:50 PM  SpO2 100 % 09/17/2017  5:50 PM  Vitals shown include unvalidated device data.  Last Pain:  Vitals:   09/17/17 1359  TempSrc:   PainSc: 0-No pain         Complications: No apparent anesthesia complications

## 2017-09-17 NOTE — Progress Notes (Signed)
Labs faxed from [place] office from May 1. Copies placed in chart. Therefore Epic orders for those labs discontinued as these are within date.

## 2017-09-17 NOTE — Anesthesia Preprocedure Evaluation (Addendum)
Anesthesia Evaluation  Patient identified by MRN, date of birth, ID band Patient awake    Reviewed: Allergy & Precautions, NPO status , Patient's Chart, lab work & pertinent test results  Airway Mallampati: I  TM Distance: >3 FB Neck ROM: Full    Dental  (+) Missing, Poor Dentition, Chipped,    Pulmonary neg pulmonary ROS,    breath sounds clear to auscultation       Cardiovascular hypertension, Pt. on medications  Rhythm:Regular Rate:Normal     Neuro/Psych negative neurological ROS     GI/Hepatic negative GI ROS, Neg liver ROS,   Endo/Other    Renal/GU negative Renal ROS     Musculoskeletal   Abdominal Normal abdominal exam  (+)   Peds  Hematology  (+) Sickle cell trait ,   Anesthesia Other Findings   Reproductive/Obstetrics                            Anesthesia Physical Anesthesia Plan  ASA: II  Anesthesia Plan: General   Post-op Pain Management:    Induction: Intravenous  PONV Risk Score and Plan: Ondansetron and Dexamethasone  Airway Management Planned:   Additional Equipment: None  Intra-op Plan:   Post-operative Plan: Extubation in OR  Informed Consent: I have reviewed the patients History and Physical, chart, labs and discussed the procedure including the risks, benefits and alternatives for the proposed anesthesia with the patient or authorized representative who has indicated his/her understanding and acceptance.   Dental advisory given  Plan Discussed with: CRNA  Anesthesia Plan Comments:        Anesthesia Quick Evaluation

## 2017-09-17 NOTE — Discharge Instructions (Addendum)
HOME CARE INSTRUCTIONS FOR SCROTAL PROCEDURES ° °Wound Care & Hygiene: °You may apply an ice bag to the scrotum for the first 24 hours.  This may help decrease swelling and soreness.  You may have a dressing held in place by an athletic supporter.  You may remove the dressing in 24 hours and shower in 48 hours.  Continue to use the athletic supporter or tight briefs for at least a week. °Activity: °Rest today - not necessarily flat bed rest.  Just take it easy.  You should not do strenuous activities until your follow-up visit with your doctor.  You may resume light activity in 48 hours. ° °Return to Work: ° °Your doctor will advise you of this depending on the type of work you do ° °Diet: °Drink liquids or eat a light diet this evening.  You may resume a regular diet tomorrow. ° °General Expectations: °You may have a small amount of bleeding.  The scrotum may be swollen or bruised for about a week. ° °Call your Doctor if these occur: ° -persistent or heavy bleeding ° -temperature of 101 degrees or more ° -severe pain, not relieved by your pain medication ° ° ° °Patient Signature:  __________________________________________________ ° °Nurse's Signature:  __________________________________________________ ° °

## 2017-09-18 ENCOUNTER — Encounter (HOSPITAL_COMMUNITY): Payer: Self-pay | Admitting: Urology

## 2017-09-18 NOTE — Op Note (Signed)
Procedure: 1.  Prostate ultrasound and ultrasound-guided prostate biopsy. 2.  Bilateral scrotal orchiectomy. 3.  Cystoscopy with right retrograde pyelogram and interpretation with attempted right ureteral stent placement. 4.  Cystoscopy with fulguration of bladder neck.  Preoperative diagnosis: Prostate cancer with bone and lymph node metastases and right ureteral obstruction.  Postoperative diagnosis: Same.    Surgeon: Dr. [name].  Anesthesia: General.  Specimen: Prostate biopsy cores and testicles.  EBL: 50 mL.  Drains: None.  Complications: None.  Indications: Carlos Harper is a 77 year old African-American male who was initially referred for a PSA of 920.  He underwent staging for presumed prostate cancer and was found to have widely metastatic disease of the bones with lymphadenopathy and local extension of the tumor at the right bladder base with right hydronephrosis.  It was felt that prostate biopsy was indicated to obtain tissue to confirm the diagnosis and bilateral orchiectomy to treat the metastatic prostate cancer.  It was also planned that we would attempt to place a right ureteral stent because of the hydronephrosis and renal insufficiency with a creatinine of 1.73 preoperatively.  Procedure: He had been given p.o. Levaquin and was given Ancef and gentamicin for the procedure.  He was taken to the operating room where a general anesthetic was induced.  He was placed in the lithotomy position and fitted with PAS hose.  The 10 MHz transrectal ultrasound probe was assembled and inserted and scanning was performed.  This demonstrated apparent involvement in the area of the right seminal vesicles with a large mass at the base of the bladder.  There was asymmetric hypoechogenicity of the peripheral zone right greater than left.  The prostate was approximately 84 mm in size.  After the initial scan, a total of 6 core biopsies were obtained from the base mid and apical prostate on each  side.  The scrotum was then clipped and he was prepped with Betadine solution and draped in the usual sterile fashion.  A midline scrotal incision was made with a knife in the right testicle was delivered through the dartos using the Bovie.  The testicle was dissected out from the tunica vaginalis down to the cord.  The proximal cord was infiltrated with 5 cc of quarter percent Marcaine.  The cord was divided into 2 packets which were then clamped and divided and doubly ligated with 0 Vicryl.  This procedure was then repeated on the left side.  The incision was then closed using a running 3-0 chromic on the dartos fascia and a running 3-0 chromic horizontal mattress on the skin.  The skin incision was reinforced with Dermabond.  I then performed cystoscopy with a 52 French scope and 30 and 70 degree lenses.  The urethra was normal.  The external sphincter was intact.  The prostatic urethra was elongated with bilobar hyperplasia with coaptation and obstruction.  The base of the bladder was elevated right greater than left and the tissue was friable and bled easily.  There was moderate trabeculation.  No obvious papillary tumors were noted.  The left ureteral orifice was somewhat elevated and the right ureteral orifice was difficult to find requiring the 70 degree lens because of the subtrigonal mass.  After much effort and the use of an Albarans bridge, I was able to get a 5 [place] open and catheter into the right ureteral orifice.  Contrast was instilled.  The right retrograde pyelogram revealed a tortuous very narrow right distal ureter with J hooking for approximately 4 to 5 cm  in length with proximal dilation and some irregularity at the base the dilation.  Multiple attempts at passing a glide and sensor wire were unsuccessful.  At this point there was bleeding from the bladder neck so the irrigant was changed from saline to water and the Bugbee electrode was used to generously fulgurate the areas  of bleeding.  Once hemostasis was achieved, the bladder was drained and the scope was removed.  His anesthetic was reversed, he was taken down from lithotomy position and he was moved to recovery room in stable condition.  There were no complications.

## 2017-09-24 ENCOUNTER — Other Ambulatory Visit: Payer: Self-pay | Admitting: Urology

## 2017-09-24 ENCOUNTER — Other Ambulatory Visit (HOSPITAL_COMMUNITY)
Admission: RE | Admit: 2017-09-24 | Discharge: 2017-09-24 | Disposition: A | Discharge status: Home / Self Care | Payer: Medicaid Other | Source: Ambulatory Visit | Attending: Urology | Admitting: Urology

## 2017-09-24 DIAGNOSIS — R972 Elevated prostate specific antigen [PSA]: Secondary | ICD-10-CM | POA: Diagnosis not present

## 2017-09-24 DIAGNOSIS — N130 Hydronephrosis with ureteropelvic junction obstruction: Secondary | ICD-10-CM

## 2017-09-24 DIAGNOSIS — C778 Secondary and unspecified malignant neoplasm of lymph nodes of multiple regions: Secondary | ICD-10-CM

## 2017-09-24 DIAGNOSIS — C96A Histiocytic sarcoma: Principal | ICD-10-CM

## 2017-09-24 LAB — BASIC METABOLIC PANEL
Anion gap: 7 (ref 5–15)
BUN: 19 mg/dL (ref 6–20)
CALCIUM: 9 mg/dL (ref 8.9–10.3)
CHLORIDE: 107 mmol/L (ref 101–111)
CO2: 27 mmol/L (ref 22–32)
CREATININE: 1.69 mg/dL — AB (ref 0.61–1.24)
GFR calc Af Amer: 43 mL/min — ABNORMAL LOW (ref >60)
GFR calc non Af Amer: 37 mL/min — ABNORMAL LOW (ref >60)
Glucose, Bld: 91 mg/dL (ref 65–99)
Potassium: 4.7 mmol/L (ref 3.5–5.1)
Sodium: 141 mmol/L (ref 135–145)

## 2017-09-24 LAB — PSA: Prostatic Specific Antigen: 422 ng/mL — ABNORMAL HIGH (ref 0.00–4.00)

## 2017-09-27 ENCOUNTER — Inpatient Hospital Stay (HOSPITAL_COMMUNITY): Payer: Medicaid Other | Attending: Hematology | Admitting: Hematology

## 2017-09-27 ENCOUNTER — Encounter (HOSPITAL_COMMUNITY): Payer: Self-pay | Admitting: Hematology

## 2017-09-27 ENCOUNTER — Other Ambulatory Visit: Payer: Self-pay

## 2017-09-27 DIAGNOSIS — C7951 Secondary malignant neoplasm of bone: Secondary | ICD-10-CM | POA: Insufficient documentation

## 2017-09-27 DIAGNOSIS — C61 Malignant neoplasm of prostate: Secondary | ICD-10-CM | POA: Insufficient documentation

## 2017-09-27 DIAGNOSIS — I10 Essential (primary) hypertension: Secondary | ICD-10-CM

## 2017-09-27 HISTORY — DX: Malignant neoplasm of prostate: C61
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
## 2017-09-27 NOTE — Assessment & Plan Note (Addendum)
1.  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  However he reported back pain and chest wall pain for 3 months which improved after orchiectomy. - Bone scan on 09/12/2017 shows extensive abnormal uptake in the shoulders, sternum, ribs, throughout the spine, pelvis and proximal to mid femurs.  CT scan of the abdomen and pelvis without contrast on 09/12/2017 shows extensive retroperitoneal adenopathy,  right hydronephrosis. - Underwent prostate biopsy (Gleason 9), bilateral orchiectomy, cystoscopy with fulguration of bladder neck, attempted right ureteral stent placement on 09/17/2017. -Most recent PSA on 09/24/2017 has improved to 422. - As he has high-volume disease, I have recommended combining ADT (orchiectomy) with either docetaxel or Abiraterone as it significantly prolongs overall survival compared to ADT alone.  We talked about various randomized control trials including LATITUDE, STAMPEDE and CHAARTED.  We discussed the potential toxicities of Abiraterone (hypokalemia, hypertension, edema, hepatotoxicity) and docetaxel (myelosuppression, febrile infections, neuropathy and nail changes) as well as duration (33 months for Abiraterone versus 18 weeks for docetaxel) and cost of treatment.  Patient elected to start with docetaxel which is given at 75 mg/m once every 3 weeks for 6 cycles followed by observation.  He will need port placement for chemotherapy administration.  I will make a referral to surgery.  I will see him back after port placement.  We will also set up chemotherapy education.  We will also consider checking BRCA 1/2 and PALB2 mutation testing down the line.  2.  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  Because of increased bone turnover and decreased bone mineral density, I have recommended monthly denosumab injections to decrease skeletal related events.  I have also recommended calcium  supplements with vitamin D twice daily.  We talked about the side effects of denosumab including but not limited to hypocalcemia, rare chance of osteonecrosis of the jaw.  He has an appointment to see his dentist soon.  We will start him on denosumab once he sees his dentist.  3.  Hypertension: -He is on ramipril 2.5 mg daily.  His latest creatinine has improved to 1.6.  We will keep a close eye on it.  We will consider Norvasc if the creatinine gets worse.

## 2017-10-02 ENCOUNTER — Ambulatory Visit (HOSPITAL_COMMUNITY)
Admission: RE | Admit: 2017-10-02 | Discharge: 2017-10-02 | Disposition: A | Discharge status: Home / Self Care | Payer: Medicaid Other | Source: Ambulatory Visit | Attending: Urology | Admitting: Urology

## 2017-10-02 DIAGNOSIS — C96A Histiocytic sarcoma: Secondary | ICD-10-CM | POA: Diagnosis not present

## 2017-10-02 DIAGNOSIS — C778 Secondary and unspecified malignant neoplasm of lymph nodes of multiple regions: Secondary | ICD-10-CM

## 2017-10-02 DIAGNOSIS — N131 Hydronephrosis with ureteral stricture, not elsewhere classified: Secondary | ICD-10-CM | POA: Insufficient documentation

## 2017-10-02 DIAGNOSIS — N400 Enlarged prostate without lower urinary tract symptoms: Secondary | ICD-10-CM | POA: Insufficient documentation

## 2017-10-02 DIAGNOSIS — N130 Hydronephrosis with ureteropelvic junction obstruction: Secondary | ICD-10-CM | POA: Diagnosis present

## 2017-10-04 ENCOUNTER — Ambulatory Visit (INDEPENDENT_AMBULATORY_CARE_PROVIDER_SITE_OTHER): Payer: Medicaid Other | Admitting: Urology

## 2017-10-04 DIAGNOSIS — N130 Hydronephrosis with ureteropelvic junction obstruction: Secondary | ICD-10-CM

## 2017-10-04 DIAGNOSIS — C7951 Secondary malignant neoplasm of bone: Secondary | ICD-10-CM

## 2017-10-04 DIAGNOSIS — C61 Malignant neoplasm of prostate: Secondary | ICD-10-CM | POA: Diagnosis not present

## 2017-10-04 DIAGNOSIS — C778 Secondary and unspecified malignant neoplasm of lymph nodes of multiple regions: Secondary | ICD-10-CM | POA: Diagnosis not present

## 2017-10-08 ENCOUNTER — Ambulatory Visit (INDEPENDENT_AMBULATORY_CARE_PROVIDER_SITE_OTHER): Payer: Medicaid Other | Admitting: General Surgery

## 2017-10-08 ENCOUNTER — Other Ambulatory Visit: Payer: Self-pay

## 2017-10-08 ENCOUNTER — Encounter (HOSPITAL_COMMUNITY): Payer: Self-pay

## 2017-10-08 ENCOUNTER — Encounter (HOSPITAL_COMMUNITY)
Admission: RE | Admit: 2017-10-08 | Discharge: 2017-10-08 | Disposition: A | Discharge status: Home / Self Care | Payer: Medicaid Other | Source: Ambulatory Visit | Attending: General Surgery | Admitting: General Surgery

## 2017-10-08 ENCOUNTER — Encounter: Payer: Self-pay | Admitting: General Surgery

## 2017-10-08 VITALS — BP 133/84 | HR 59 | Temp 98.0°F | Resp 18 | Ht 67.0 in | Wt 159.5 lb

## 2017-10-08 DIAGNOSIS — Z01812 Encounter for preprocedural laboratory examination: Secondary | ICD-10-CM | POA: Insufficient documentation

## 2017-10-08 DIAGNOSIS — C61 Malignant neoplasm of prostate: Secondary | ICD-10-CM

## 2017-10-08 LAB — CBC
HEMATOCRIT: 35.5 % — AB (ref 39.0–52.0)
Hemoglobin: 11.6 g/dL — ABNORMAL LOW (ref 13.0–17.0)
MCH: 28.7 pg (ref 26.0–34.0)
MCHC: 32.7 g/dL (ref 30.0–36.0)
MCV: 87.9 fL (ref 78.0–100.0)
Platelets: 265 10*3/uL (ref 150–400)
RBC: 4.04 MIL/uL — ABNORMAL LOW (ref 4.22–5.81)
RDW: 13.7 % (ref 11.5–15.5)
WBC: 4.1 10*3/uL (ref 4.0–10.5)

## 2017-10-08 LAB — BASIC METABOLIC PANEL
Anion gap: 6 (ref 5–15)
BUN: 23 mg/dL — ABNORMAL HIGH (ref 6–20)
CALCIUM: 9.1 mg/dL (ref 8.9–10.3)
CO2: 27 mmol/L (ref 22–32)
Chloride: 107 mmol/L (ref 101–111)
Creatinine, Ser: 1.8 mg/dL — ABNORMAL HIGH (ref 0.61–1.24)
GFR calc Af Amer: 40 mL/min — ABNORMAL LOW (ref >60)
GFR, EST NON AFRICAN AMERICAN: 35 mL/min — AB (ref >60)
GLUCOSE: 89 mg/dL (ref 65–99)
POTASSIUM: 5.2 mmol/L — AB (ref 3.5–5.1)
Sodium: 140 mmol/L (ref 135–145)
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
## 2017-10-08 NOTE — Patient Instructions (Signed)
Implanted Port Insertion Implanted port insertion is a procedure to put in a port and catheter. The port is a device with an injectable disk that can be accessed by your health care provider. The port is connected to a vein in the chest or neck by a small flexible tube (catheter). There are different types of ports. The implanted port may be used as a long-term IV access for:  Medicines, such as chemotherapy.  Fluids.  Liquid nutrition, such as total parenteral nutrition (TPN).  Blood samples.  Having a port means that your health care provider will not need to use the veins in your arms for these procedures. Tell a health care provider about:  Any allergies you have.  All medicines you are taking, especially blood thinners, as well as any vitamins, herbs, eye drops, creams, over-the-counter medicines, and steroids.  Any problems you or family members have had with anesthetic medicines.  Any blood disorders you have.  Any surgeries you have had.  Any medical conditions you have, including diabetes or kidney problems.  Whether you are pregnant or may be pregnant. What are the risks? Generally, this is a safe procedure. However, problems may occur, including:  Allergic reactions to medicines or dyes.  Damage to other structures or organs.  Infection.  Damage to the blood vessel, bruising, or bleeding at the puncture site.  Blood clot.  Breakdown of the skin over the port.  A collection of air in the chest that can cause one of the lungs to collapse (pneumothorax). This is rare.  What happens before the procedure? Staying hydrated Follow instructions from your health care provider about hydration, which may include:  Up to 2 hours before the procedure - you may continue to drink clear liquids, such as water, clear fruit juice, black coffee, and plain tea.  Eating and drinking restrictions  Follow instructions from your health care provider about eating and drinking,  which may include: ? 8 hours before the procedure - stop eating heavy meals or foods such as meat, fried foods, or fatty foods. ? 6 hours before the procedure - stop eating light meals or foods, such as toast or cereal. ? 6 hours before the procedure - stop drinking milk or drinks that contain milk. ? 2 hours before the procedure - stop drinking clear liquids. Medicines  Ask your health care provider about: ? Changing or stopping your regular medicines. This is especially important if you are taking diabetes medicines or blood thinners. ? Taking medicines such as aspirin and ibuprofen. These medicines can thin your blood. Do not take these medicines before your procedure if your health care provider instructs you not to.  You may be given antibiotic medicine to help prevent infection. General instructions  Plan to have someone take you home from the hospital or clinic.  If you will be going home right after the procedure, plan to have someone with you for 24 hours.  You may have blood tests.  You may be asked to shower with a germ-killing soap. What happens during the procedure?  To lower your risk of infection: ? Your health care team will wash or sanitize their hands. ? Your skin will be washed with soap. ? Hair may be removed from the surgical area.  An IV tube will be inserted into one of your veins.  You will be given one or more of the following: ? A medicine to help you relax (sedative). ? A medicine to numb the area (local anesthetic).    Two small cuts (incisions) will be made to insert the port. ? One incision will be made in your neck to get access to the vein where the catheter will lie. ? The other incision will be made in the upper chest. This is where the port will lie.  The procedure may be done using continuous X-ray (fluoroscopy) or other imaging tools for guidance.  The port and catheter will be placed. There may be a small, raised area where the port  is.  The port will be flushed with a salt solution (saline), and blood will be drawn to make sure that it is working correctly.  The incisions will be closed.  Bandages (dressings) may be placed over the incisions. The procedure may vary among health care providers and hospitals. What happens after the procedure?  Your blood pressure, heart rate, breathing rate, and blood oxygen level will be monitored until the medicines you were given have worn off.  Do not drive for 24 hours if you were given a sedative.  You will be given a manufacturer's information card for the type of port that you have. Keep this with you.  Your port will need to be flushed and checked as told by your health care provider, usually every few weeks.  A chest X-ray will be done to: ? Check the placement of the port. ? Make sure there is no injury to your lung. Summary  Implanted port insertion is a procedure to put in a port and catheter.  The implanted port is used as a long-term IV access.  The port will need to be flushed and checked as told by your health care provider, usually every few weeks.  Keep your manufacturer's information card with you at all times. This information is not intended to replace advice given to you by your health care provider. Make sure you discuss any questions you have with your health care provider. Document Released: 01/28/2013 Document Revised: 02/29/2016 Document Reviewed: 02/29/2016 Elsevier Interactive Patient Education  2017 [employer] Implanted Port Home Guide An implanted port is a type of central line that is placed under the skin. Central lines are used to provide IV access when treatment or nutrition needs to be given through a person's veins. Implanted ports are used for long-term IV access. An implanted port may be placed because:  You need IV medicine that would be irritating to the small veins in your hands or arms.  You need long-term IV medicines, such as  antibiotics.  You need IV nutrition for a long period.  You need frequent blood draws for lab tests.  You need dialysis.  Implanted ports are usually placed in the chest area, but they can also be placed in the upper arm, the abdomen, or the leg. An implanted port has two main parts:  Reservoir. The reservoir is round and will appear as a small, raised area under your skin. The reservoir is the part where a needle is inserted to give medicines or draw blood.  Catheter. The catheter is a thin, flexible tube that extends from the reservoir. The catheter is placed into a large vein. Medicine that is inserted into the reservoir goes into the catheter and then into the vein.  How will I care for my incision site? Do not get the incision site wet. Bathe or shower as directed by your health care provider. How is my port accessed? Special steps must be taken to access the port:  Before the port is accessed,   a numbing cream can be placed on the skin. This helps numb the skin over the port site.  Your health care provider uses a sterile technique to access the port. ? Your health care provider must put on a mask and sterile gloves. ? The skin over your port is cleaned carefully with an antiseptic and allowed to dry. ? The port is gently pinched between sterile gloves, and a needle is inserted into the port.  Only "non-coring" port needles should be used to access the port. Once the port is accessed, a blood return should be checked. This helps ensure that the port is in the vein and is not clogged.  If your port needs to remain accessed for a constant infusion, a clear (transparent) bandage will be placed over the needle site. The bandage and needle will need to be changed every week, or as directed by your health care provider.  Keep the bandage covering the needle clean and dry. Do not get it wet. Follow your health care provider's instructions on how to take a shower or bath while the port is  accessed.  If your port does not need to stay accessed, no bandage is needed over the port.  What is flushing? Flushing helps keep the port from getting clogged. Follow your health care provider's instructions on how and when to flush the port. Ports are usually flushed with saline solution or a medicine called heparin. The need for flushing will depend on how the port is used.  If the port is used for intermittent medicines or blood draws, the port will need to be flushed: ? After medicines have been given. ? After blood has been drawn. ? As part of routine maintenance.  If a constant infusion is running, the port may not need to be flushed.  How long will my port stay implanted? The port can stay in for as long as your health care provider thinks it is needed. When it is time for the port to come out, surgery will be done to remove it. The procedure is similar to the one performed when the port was put in. When should I seek immediate medical care? When you have an implanted port, you should seek immediate medical care if:  You notice a bad smell coming from the incision site.  You have swelling, redness, or drainage at the incision site.  You have more swelling or pain at the port site or the surrounding area.  You have a fever that is not controlled with medicine.  This information is not intended to replace advice given to you by your health care provider. Make sure you discuss any questions you have with your health care provider. Document Released: 04/09/2005 Document Revised: 09/15/2015 Document Reviewed: 12/15/2012 Elsevier Interactive Patient Education  2017 [employer] Inc[employer]  

## 2017-10-08 NOTE — Patient Instructions (Signed)
Carlos Harper  10/08/2017     @PREFPERIOPPHARMACY @   Your procedure is scheduled on Monday, October 14, 2017.  Report to [facility] at [phone] A.M.  Call this number if you have problems the morning of surgery:  [phone]   Remember:  Do not eat or drink after midnight.  You may drink clear liquids until midnight .  Clear liquids allowed are:                    Juice (non-citric and without pulp), Carbonated beverages, Clear Tea, Black Coffee only, Plain Jell-O only, Gatorade and Plain Popsicles only    Take these medicines the morning of surgery with A SIP OF WATER :ramipril    Do not wear jewelry, make-up or nail polish.  Do not wear lotions, powders, or perfumes, or deodorant.  Do not shave 48 hours prior to surgery.  Men may shave face and neck.  Do not bring valuables to the hospital.  [facility] is not responsible for any belongings or valuables.  Contacts, dentures or bridgework may not be worn into surgery.  Leave your suitcase in the car.  After surgery it may be brought to your room.  For patients admitted to the hospital, discharge time will be determined by your treatment team.  Patients discharged the day of surgery will not be allowed to drive home.   Name and phone number of your driver:   family Special instructions:  Follow any special instructions that Dr. Arnoldo Morale gave you.  Please read over the following fact sheets that you were given. Coughing and Deep Breathing, Anesthesia Post-op Instructions and Care and Recovery After Surgery      Implanted Port Insertion, Care After This sheet gives you information about how to care for yourself after your procedure. Your health care provider may also give you more specific instructions. If you have problems or questions, contact your health care provider. What can I expect after the procedure? After your procedure, it is common to have:  Discomfort at the port insertion site.  Bruising on the skin over the  port. This should improve over 3-4 days.  Follow these instructions at home: [facility] care  After your port is placed, you will get a manufacturer's information card. The card has information about your port. Keep this card with you at all times.  Take care of the port as told by your health care provider. Ask your health care provider if you or a family member can get training for taking care of the port at home. A home health care nurse may also take care of the port.  Make sure to remember what type of port you have. Incision care  Follow instructions from your health care provider about how to take care of your port insertion site. Make sure you: ? Wash your hands with soap and water before you change your bandage (dressing). If soap and water are not available, use hand sanitizer. ? Change your dressing as told by your health care provider. ? Leave stitches (sutures), skin glue, or adhesive strips in place. These skin closures may need to stay in place for 2 weeks or longer. If adhesive strip edges start to loosen and curl up, you may trim the loose edges. Do not remove adhesive strips completely unless your health care provider tells you to do that.  Check your port insertion site every day for signs of infection. Check for: ? More redness, swelling, or pain. ?  More fluid or blood. ? Warmth. ? Pus or a bad smell. General instructions  Do not take baths, swim, or use a hot tub until your health care provider approves.  Do not lift anything that is heavier than 10 lb (4.5 kg) for a week, or as told by your health care provider.  Ask your health care provider when it is okay to: ? Return to work or school. ? Resume usual physical activities or sports.  Do not drive for 24 hours if you were given a medicine to help you relax (sedative).  Take over-the-counter and prescription medicines only as told by your health care provider.  Wear a medical alert bracelet in case of an emergency.  This will tell any health care providers that you have a port.  Keep all follow-up visits as told by your health care provider. This is important. Contact a health care provider if:  You cannot flush your port with saline as directed, or you cannot draw blood from the port.  You have a fever or chills.  You have more redness, swelling, or pain around your port insertion site.  You have more fluid or blood coming from your port insertion site.  Your port insertion site feels warm to the touch.  You have pus or a bad smell coming from the port insertion site. Get help right away if:  You have chest pain or shortness of breath.  You have bleeding from your port that you cannot control. Summary  Take care of the port as told by your health care provider.  Change your dressing as told by your health care provider.  Keep all follow-up visits as told by your health care provider. This information is not intended to replace advice given to you by your health care provider. Make sure you discuss any questions you have with your health care provider. Document Released: 01/28/2013 Document Revised: 02/29/2016 Document Reviewed: 02/29/2016 Elsevier Interactive Patient Education  2017 [place] An implanted port is a type of central line that is placed under the skin. Central lines are used to provide IV access when treatment or nutrition needs to be given through a person's veins. Implanted ports are used for long-term IV access. An implanted port may be placed because:  You need IV medicine that would be irritating to the small veins in your hands or arms.  You need long-term IV medicines, such as antibiotics.  You need IV nutrition for a long period.  You need frequent blood draws for lab tests.  You need dialysis.  Implanted ports are usually placed in the chest area, but they can also be placed in the upper arm, the abdomen, or the leg. An implanted  port has two main parts:  Reservoir. The reservoir is round and will appear as a small, raised area under your skin. The reservoir is the part where a needle is inserted to give medicines or draw blood.  Catheter. The catheter is a thin, flexible tube that extends from the reservoir. The catheter is placed into a large vein. Medicine that is inserted into the reservoir goes into the catheter and then into the vein.  How will I care for my incision site? Do not get the incision site wet. Bathe or shower as directed by your health care provider. How is my port accessed? Special steps must be taken to access the port:  Before the port is accessed, a numbing cream can be placed on the  skin. This helps numb the skin over the port site.  Your health care provider uses a sterile technique to access the port. ? Your health care provider must put on a mask and sterile gloves. ? The skin over your port is cleaned carefully with an antiseptic and allowed to dry. ? The port is gently pinched between sterile gloves, and a needle is inserted into the port.  Only "non-coring" port needles should be used to access the port. Once the port is accessed, a blood return should be checked. This helps ensure that the port is in the vein and is not clogged.  If your port needs to remain accessed for a constant infusion, a clear (transparent) bandage will be placed over the needle site. The bandage and needle will need to be changed every week, or as directed by your health care provider.  Keep the bandage covering the needle clean and dry. Do not get it wet. Follow your health care provider's instructions on how to take a shower or bath while the port is accessed.  If your port does not need to stay accessed, no bandage is needed over the port.  What is flushing? Flushing helps keep the port from getting clogged. Follow your health care provider's instructions on how and when to flush the port. Ports are usually  flushed with saline solution or a medicine called heparin. The need for flushing will depend on how the port is used.  If the port is used for intermittent medicines or blood draws, the port will need to be flushed: ? After medicines have been given. ? After blood has been drawn. ? As part of routine maintenance.  If a constant infusion is running, the port may not need to be flushed.  How long will my port stay implanted? The port can stay in for as long as your health care provider thinks it is needed. When it is time for the port to come out, surgery will be done to remove it. The procedure is similar to the one performed when the port was put in. When should I seek immediate medical care? When you have an implanted port, you should seek immediate medical care if:  You notice a bad smell coming from the incision site.  You have swelling, redness, or drainage at the incision site.  You have more swelling or pain at the port site or the surrounding area.  You have a fever that is not controlled with medicine.  This information is not intended to replace advice given to you by your health care provider. Make sure you discuss any questions you have with your health care provider. Document Released: 04/09/2005 Document Revised: 09/15/2015 Document Reviewed: 12/15/2012 Elsevier Interactive Patient Education  2017 [name] Anesthesia is a term that refers to techniques, procedures, and medicines that help a person stay safe and comfortable during a medical procedure. Monitored anesthesia care, or sedation, is one type of anesthesia. Your anesthesia specialist may recommend sedation if you will be having a procedure that does not require you to be unconscious, such as:  Cataract surgery.  A dental procedure.  A biopsy.  A colonoscopy.  During the procedure, you may receive a medicine to help you relax (sedative). There are three levels of  sedation:  Mild sedation. At this level, you may feel awake and relaxed. You will be able to follow directions.  Moderate sedation. At this level, you will be sleepy. You may not remember the  procedure.  Deep sedation. At this level, you will be asleep. You will not remember the procedure.  The more medicine you are given, the deeper your level of sedation will be. Depending on how you respond to the procedure, the anesthesia specialist may change your level of sedation or the type of anesthesia to fit your needs. An anesthesia specialist will monitor you closely during the procedure. Let your health care provider know about:  Any allergies you have.  All medicines you are taking, including vitamins, herbs, eye drops, creams, and over-the-counter medicines.  Any use of steroids (by mouth or as a cream).  Any problems you or family members have had with sedatives and anesthetic medicines.  Any blood disorders you have.  Any surgeries you have had.  Any medical conditions you have, such as sleep apnea.  Whether you are pregnant or may be pregnant.  Any use of cigarettes, alcohol, or street drugs. What are the risks? Generally, this is a safe procedure. However, problems may occur, including:  Getting too much medicine (oversedation).  Nausea.  Allergic reaction to medicines.  Trouble breathing. If this happens, a breathing tube may be used to help with breathing. It will be removed when you are awake and breathing on your own.  Heart trouble.  Lung trouble.  Before the procedure Staying hydrated Follow instructions from your health care provider about hydration, which may include:  Up to 2 hours before the procedure - you may continue to drink clear liquids, such as water, clear fruit juice, black coffee, and plain tea.  Eating and drinking restrictions Follow instructions from your health care provider about eating and drinking, which may include:  8 hours before  the procedure - stop eating heavy meals or foods such as meat, fried foods, or fatty foods.  6 hours before the procedure - stop eating light meals or foods, such as toast or cereal.  6 hours before the procedure - stop drinking milk or drinks that contain milk.  2 hours before the procedure - stop drinking clear liquids.  Medicines Ask your health care provider about:  Changing or stopping your regular medicines. This is especially important if you are taking diabetes medicines or blood thinners.  Taking medicines such as aspirin and ibuprofen. These medicines can thin your blood. Do not take these medicines before your procedure if your health care provider instructs you not to.  Tests and exams  You will have a physical exam.  You may have blood tests done to show: ? How well your kidneys and liver are working. ? How well your blood can clot.  General instructions  Plan to have someone take you home from the hospital or clinic.  If you will be going home right after the procedure, plan to have someone with you for 24 hours.  What happens during the procedure?  Your blood pressure, heart rate, breathing, level of pain and overall condition will be monitored.  An IV tube will be inserted into one of your veins.  Your anesthesia specialist will give you medicines as needed to keep you comfortable during the procedure. This may mean changing the level of sedation.  The procedure will be performed. After the procedure  Your blood pressure, heart rate, breathing rate, and blood oxygen level will be monitored until the medicines you were given have worn off.  Do not drive for 24 hours if you received a sedative.  You may: ? Feel sleepy, clumsy, or nauseous. ? Feel forgetful  about what happened after the procedure. ? Have a sore throat if you had a breathing tube during the procedure. ? Vomit. This information is not intended to replace advice given to you by your health  care provider. Make sure you discuss any questions you have with your health care provider. Document Released: 01/03/2005 Document Revised: 09/16/2015 Document Reviewed: 07/31/2015 Elsevier Interactive Patient Education  [employer].
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
## 2017-10-09 ENCOUNTER — Other Ambulatory Visit: Payer: Self-pay | Admitting: Urology

## 2017-10-09 DIAGNOSIS — N130 Hydronephrosis with ureteropelvic junction obstruction: Secondary | ICD-10-CM

## 2017-10-11 ENCOUNTER — Ambulatory Visit (HOSPITAL_COMMUNITY)
Admission: RE | Admit: 2017-10-11 | Discharge: 2017-10-11 | Disposition: A | Discharge status: Home / Self Care | Payer: Medicaid Other | Source: Ambulatory Visit | Attending: Urology | Admitting: Urology

## 2017-10-11 DIAGNOSIS — N130 Hydronephrosis with ureteropelvic junction obstruction: Secondary | ICD-10-CM | POA: Diagnosis present

## 2017-10-14 ENCOUNTER — Ambulatory Visit (HOSPITAL_COMMUNITY): Payer: Medicaid Other | Admitting: Anesthesiology

## 2017-10-14 ENCOUNTER — Ambulatory Visit (HOSPITAL_COMMUNITY)
Admission: RE | Admit: 2017-10-14 | Discharge: 2017-10-14 | Disposition: A | Discharge status: Home / Self Care | Payer: Medicaid Other | Source: Ambulatory Visit | Attending: General Surgery | Admitting: General Surgery

## 2017-10-14 ENCOUNTER — Ambulatory Visit (HOSPITAL_COMMUNITY): Payer: Medicaid Other

## 2017-10-14 ENCOUNTER — Encounter (HOSPITAL_COMMUNITY): Payer: Self-pay | Admitting: Anesthesiology

## 2017-10-14 ENCOUNTER — Encounter (HOSPITAL_COMMUNITY)
Admission: RE | Disposition: A | Discharge status: Home / Self Care | Payer: Self-pay | Source: Ambulatory Visit | Attending: General Surgery

## 2017-10-14 DIAGNOSIS — I10 Essential (primary) hypertension: Secondary | ICD-10-CM | POA: Insufficient documentation

## 2017-10-14 DIAGNOSIS — C7989 Secondary malignant neoplasm of other specified sites: Secondary | ICD-10-CM | POA: Insufficient documentation

## 2017-10-14 DIAGNOSIS — D573 Sickle-cell trait: Secondary | ICD-10-CM | POA: Insufficient documentation

## 2017-10-14 DIAGNOSIS — C61 Malignant neoplasm of prostate: Secondary | ICD-10-CM | POA: Diagnosis not present

## 2017-10-14 DIAGNOSIS — Z79899 Other long term (current) drug therapy: Secondary | ICD-10-CM | POA: Insufficient documentation

## 2017-10-14 DIAGNOSIS — Z95828 Presence of other vascular implants and grafts: Secondary | ICD-10-CM

## 2017-10-14 HISTORY — PX: PORTACATH PLACEMENT: SHX2246

## 2017-10-14 SURGERY — INSERTION, TUNNELED CENTRAL VENOUS DEVICE, WITH PORT
Anesthesia: Monitor Anesthesia Care | Site: Chest | Laterality: Left

## 2017-10-14 MED ORDER — KETOROLAC TROMETHAMINE 30 MG/ML IJ SOLN: 30.0000 mg | Freq: Once | INTRAMUSCULAR | Status: DC | PRN

## 2017-10-14 MED ORDER — LIDOCAINE HCL (PF) 1 % IJ SOLN
INTRAMUSCULAR | Status: AC
  Filled 2017-10-14: qty 30

## 2017-10-14 MED ORDER — KETOROLAC TROMETHAMINE 30 MG/ML IJ SOLN
30.0000 mg | Freq: Once | INTRAMUSCULAR | Status: AC
  Administered 2017-10-14: 30 mg via INTRAVENOUS

## 2017-10-14 MED ORDER — CHLORHEXIDINE GLUCONATE CLOTH 2 % EX PADS: 6.0000 | MEDICATED_PAD | Freq: Once | CUTANEOUS | Status: DC

## 2017-10-14 MED ORDER — HYDROCODONE-ACETAMINOPHEN 5-325 MG PO TABS: 1.0000 | ORAL_TABLET | ORAL | 0 refills | Status: DC | PRN

## 2017-10-14 MED ORDER — HEPARIN SOD (PORK) LOCK FLUSH 100 UNIT/ML IV SOLN
INTRAVENOUS | Status: DC | PRN
  Administered 2017-10-14: 500 [IU] via INTRAVENOUS

## 2017-10-14 MED ORDER — MIDAZOLAM HCL 2 MG/2ML IJ SOLN
INTRAMUSCULAR | Status: AC
  Filled 2017-10-14: qty 2

## 2017-10-14 MED ORDER — CEFAZOLIN SODIUM-DEXTROSE 2-4 GM/100ML-% IV SOLN
2.0000 g | INTRAVENOUS | Status: DC
  Filled 2017-10-14: qty 100

## 2017-10-14 MED ORDER — KETOROLAC TROMETHAMINE 30 MG/ML IJ SOLN
INTRAMUSCULAR | Status: AC
  Filled 2017-10-14: qty 1

## 2017-10-14 MED ORDER — LIDOCAINE HCL (PF) 1 % IJ SOLN
INTRAMUSCULAR | Status: DC | PRN
  Administered 2017-10-14: 9 mL

## 2017-10-14 MED ORDER — MEPERIDINE HCL 50 MG/ML IJ SOLN: 6.2500 mg | INTRAMUSCULAR | Status: DC | PRN

## 2017-10-14 MED ORDER — ONDANSETRON HCL 4 MG/2ML IJ SOLN: 4.0000 mg | Freq: Once | INTRAMUSCULAR | Status: DC | PRN

## 2017-10-14 MED ORDER — SODIUM CHLORIDE 0.9 % IV SOLN
INTRAVENOUS | Status: AC | PRN
  Administered 2017-10-14: 500 mL via INTRAMUSCULAR

## 2017-10-14 MED ORDER — PROPOFOL 10 MG/ML IV BOLUS
INTRAVENOUS | Status: AC
  Filled 2017-10-14: qty 20

## 2017-10-14 MED ORDER — HEPARIN SOD (PORK) LOCK FLUSH 100 UNIT/ML IV SOLN
INTRAVENOUS | Status: AC
  Filled 2017-10-14: qty 5

## 2017-10-14 MED ORDER — HYDROCODONE-ACETAMINOPHEN 7.5-325 MG PO TABS: 1.0000 | ORAL_TABLET | Freq: Once | ORAL | Status: DC | PRN

## 2017-10-14 MED ORDER — FENTANYL CITRATE (PF) 100 MCG/2ML IJ SOLN
INTRAMUSCULAR | Status: AC
  Filled 2017-10-14: qty 2

## 2017-10-14 MED ORDER — PROPOFOL 500 MG/50ML IV EMUL
INTRAVENOUS | Status: DC | PRN
  Administered 2017-10-14: 65 ug/kg/min via INTRAVENOUS

## 2017-10-14 MED ORDER — LACTATED RINGERS IV SOLN
INTRAVENOUS | Status: DC
  Administered 2017-10-14: 1000 mL via INTRAVENOUS

## 2017-10-14 MED ORDER — HYDROMORPHONE HCL 1 MG/ML IJ SOLN: 0.2500 mg | INTRAMUSCULAR | Status: DC | PRN

## 2017-10-14 SURGICAL SUPPLY — 31 items
ADH SKN CLS APL DERMABOND .7 (GAUZE/BANDAGES/DRESSINGS) ×1
BAG DECANTER FOR FLEXI CONT (MISCELLANEOUS) ×3 IMPLANT
CHLORAPREP W/TINT 10.5 ML (MISCELLANEOUS) ×3 IMPLANT
CLOTH BEACON ORANGE TIMEOUT ST (SAFETY) ×3 IMPLANT
COVER LIGHT HANDLE STERIS (MISCELLANEOUS) ×6 IMPLANT
DECANTER SPIKE VIAL GLASS SM (MISCELLANEOUS) ×3 IMPLANT
DERMABOND ADVANCED (GAUZE/BANDAGES/DRESSINGS) ×2
DERMABOND ADVANCED .7 DNX12 (GAUZE/BANDAGES/DRESSINGS) ×1 IMPLANT
DRAPE C-ARM FOLDED MOBILE STRL (DRAPES) ×3 IMPLANT
ELECT REM PT RETURN 9FT ADLT (ELECTROSURGICAL) ×3
ELECTRODE REM PT RTRN 9FT ADLT (ELECTROSURGICAL) ×1 IMPLANT
GLOVE BIOGEL PI IND STRL 7.0 (GLOVE) ×2 IMPLANT
GLOVE BIOGEL PI INDICATOR 7.0 (GLOVE) ×4
GLOVE ECLIPSE 6.5 STRL STRAW (GLOVE) ×2 IMPLANT
GLOVE SURG SS PI 7.5 STRL IVOR (GLOVE) ×3 IMPLANT
GOWN STRL REUS W/TWL LRG LVL3 (GOWN DISPOSABLE) ×6 IMPLANT
IV NS 500ML (IV SOLUTION) ×3
IV NS 500ML BAXH (IV SOLUTION) ×1 IMPLANT
KIT PORT POWER 8FR ISP MRI (Port) ×3 IMPLANT
KIT TURNOVER KIT A (KITS) ×3 IMPLANT
NDL HYPO 25X1 1.5 SAFETY (NEEDLE) ×1 IMPLANT
NEEDLE HYPO 25X1 1.5 SAFETY (NEEDLE) ×3 IMPLANT
PACK MINOR (CUSTOM PROCEDURE TRAY) ×3 IMPLANT
PAD ARMBOARD 7.5X6 YLW CONV (MISCELLANEOUS) ×3 IMPLANT
SET BASIN LINEN APH (SET/KITS/TRAYS/PACK) ×3 IMPLANT
SUT MNCRL AB 4-0 PS2 18 (SUTURE) ×3 IMPLANT
SUT VIC AB 3-0 SH 27 (SUTURE) ×3
SUT VIC AB 3-0 SH 27X BRD (SUTURE) ×1 IMPLANT
SYR 20CC LL (SYRINGE) ×3 IMPLANT
SYR 5ML LL (SYRINGE) ×3 IMPLANT
SYR CONTROL 10ML LL (SYRINGE) ×3 IMPLANT

## 2017-10-14 NOTE — Anesthesia Postprocedure Evaluation (Signed)
Anesthesia Post Note  Patient: Carlos Harper  Procedure(s) Performed: INSERTION PORT-A-CATH (Left Chest)  Patient location during evaluation: PACU Anesthesia Type: MAC Level of consciousness: awake and patient cooperative Pain management: pain level controlled Vital Signs Assessment: post-procedure vital signs reviewed and stable Respiratory status: spontaneous breathing, nonlabored ventilation and respiratory function stable Cardiovascular status: blood pressure returned to baseline Postop Assessment: no apparent nausea or vomiting Anesthetic complications: no     Last Vitals:  Vitals:   10/14/17 0749  BP: 135/85  Resp: 18  Temp: 36.4 C  SpO2: 97%    Last Pain:  Vitals:   10/14/17 0749  TempSrc: Oral  PainSc: 0-No pain                 Jelena Malicoat J

## 2017-10-14 NOTE — Anesthesia Preprocedure Evaluation (Signed)
Anesthesia Evaluation  Patient identified by MRN, date of birth, ID band Patient awake    Reviewed: Allergy & Precautions, H&P , NPO status , Patient's Chart, lab work & pertinent test results  Airway Mallampati: II  TM Distance: >3 FB Neck ROM: full    Dental no notable dental hx.    Pulmonary neg pulmonary ROS,    Pulmonary exam normal breath sounds clear to auscultation       Cardiovascular Exercise Tolerance: Good hypertension, negative cardio ROS   Rhythm:regular Rate:Normal     Neuro/Psych negative neurological ROS  negative psych ROS   GI/Hepatic negative GI ROS, Neg liver ROS,   Endo/Other  negative endocrine ROS  Renal/GU negative Renal ROS  negative genitourinary   Musculoskeletal   Abdominal   Peds  Hematology negative hematology ROS (+)   Anesthesia Other Findings Prostate cancer metastatic to multiple sites   Reproductive/Obstetrics negative OB ROS                             Anesthesia Physical Anesthesia Plan  ASA: III  Anesthesia Plan: MAC   Post-op Pain Management:    Induction:   PONV Risk Score and Plan:   Airway Management Planned:   Additional Equipment:   Intra-op Plan:   Post-operative Plan:   Informed Consent: I have reviewed the patients History and Physical, chart, labs and discussed the procedure including the risks, benefits and alternatives for the proposed anesthesia with the patient or authorized representative who has indicated his/her understanding and acceptance.   Dental Advisory Given  Plan Discussed with: CRNA  Anesthesia Plan Comments:         Anesthesia Quick Evaluation

## 2017-10-14 NOTE — Transfer of Care (Signed)
Immediate Anesthesia Transfer of Care Note  Patient: Carlos Harper  Procedure(s) Performed: INSERTION PORT-A-CATH (Left Chest)  Patient Location: PACU  Anesthesia Type:MAC  Level of Consciousness: awake and patient cooperative  Airway & Oxygen Therapy: Patient Spontanous Breathing and Patient connected to nasal cannula oxygen  Post-op Assessment: Report given to RN, Post -op Vital signs reviewed and stable and Patient moving all extremities  Post vital signs: Reviewed and stable  Last Vitals:  Vitals Value Taken Time  BP    Temp    Pulse    Resp 9 10/14/2017  9:10 AM  SpO2    Vitals shown include unvalidated device data.  Last Pain:  Vitals:   10/14/17 0749  TempSrc: Oral  PainSc: 0-No pain      Patients Stated Pain Goal: 8 (29/[id number])  Complications: No apparent anesthesia complications

## 2017-10-14 NOTE — Interval H&P Note (Signed)
History and Physical Interval Note:  10/14/2017 8:05 AM  Carlos Harper  has presented today for surgery, with the diagnosis of metastatic prostate cancer  The various methods of treatment have been discussed with the patient and family. After consideration of risks, benefits and other options for treatment, the patient has consented to  Procedure(s): INSERTION PORT-A-CATH (Left) as a surgical intervention .  The patient's history has been reviewed, patient examined, no change in status, stable for surgery.  I have reviewed the patient's chart and labs.  Questions were answered to the patient's satisfaction.     Aviva Signs

## 2017-10-14 NOTE — Discharge Instructions (Signed)
Monitored Anesthesia Care, Care After These instructions provide you with information about caring for yourself after your procedure. Your health care provider may also give you more specific instructions. Your treatment has been planned according to current medical practices, but problems sometimes occur. Call your health care provider if you have any problems or questions after your procedure. What can I expect after the procedure? After your procedure, it is common to:  Feel sleepy for several hours.  Feel clumsy and have poor balance for several hours.  Feel forgetful about what happened after the procedure.  Have poor judgment for several hours.  Feel nauseous or vomit.  Have a sore throat if you had a breathing tube during the procedure.  Follow these instructions at home: For at least 24 hours after the procedure:   Do not: ? Participate in activities in which you could fall or become injured. ? Drive. ? Use heavy machinery. ? Drink alcohol. ? Take sleeping pills or medicines that cause drowsiness. ? Make important decisions or sign legal documents. ? Take care of children on your own.  Rest. Eating and drinking  Follow the diet that is recommended by your health care provider.  If you vomit, drink water, juice, or soup when you can drink without vomiting.  Make sure you have little or no nausea before eating solid foods. General instructions  Have a responsible adult stay with you until you are awake and alert.  Take over-the-counter and prescription medicines only as told by your health care provider.  If you smoke, do not smoke without supervision.  Keep all follow-up visits as told by your health care provider. This is important. Contact a health care provider if:  You keep feeling nauseous or you keep vomiting.  You feel light-headed.  You develop a rash.  You have a fever. Get help right away if:  You have trouble breathing. This information is  not intended to replace advice given to you by your health care provider. Make sure you discuss any questions you have with your health care provider. Document Released: 07/31/2015 Document Revised: 11/30/2015 Document Reviewed: 07/31/2015 Elsevier Interactive Patient Education  2018 [employer] Insertion, Care After This sheet gives you information about how to care for yourself after your procedure. Your health care provider may also give you more specific instructions. If you have problems or questions, contact your health care provider. What can I expect after the procedure? After your procedure, it is common to have:  Discomfort at the port insertion site.  Bruising on the skin over the port. This should improve over 3-4 days.  Follow these instructions at home: [facility] care  After your port is placed, you will get a manufacturer's information card. The card has information about your port. Keep this card with you at all times.  Take care of the port as told by your health care provider. Ask your health care provider if you or a family member can get training for taking care of the port at home. A home health care nurse may also take care of the port.  Make sure to remember what type of port you have. Incision care  Follow instructions from your health care provider about how to take care of your port insertion site. Make sure you: ? Wash your hands with soap and water before you change your bandage (dressing). If soap and water are not available, use hand sanitizer. ? Change your dressing as told by  your health care provider. ? Leave stitches (sutures), skin glue, or adhesive strips in place. These skin closures may need to stay in place for 2 weeks or longer. If adhesive strip edges start to loosen and curl up, you may trim the loose edges. Do not remove adhesive strips completely unless your health care provider tells you to do that.  Check your port insertion  site every day for signs of infection. Check for: ? More redness, swelling, or pain. ? More fluid or blood. ? Warmth. ? Pus or a bad smell. General instructions  Do not take baths, swim, or use a hot tub until your health care provider approves.  Do not lift anything that is heavier than 10 lb (4.5 kg) for a week, or as told by your health care provider.  Ask your health care provider when it is okay to: ? Return to work or school. ? Resume usual physical activities or sports.  Do not drive for 24 hours if you were given a medicine to help you relax (sedative).  Take over-the-counter and prescription medicines only as told by your health care provider.  Wear a medical alert bracelet in case of an emergency. This will tell any health care providers that you have a port.  Keep all follow-up visits as told by your health care provider. This is important. Contact a health care provider if:  You cannot flush your port with saline as directed, or you cannot draw blood from the port.  You have a fever or chills.  You have more redness, swelling, or pain around your port insertion site.  You have more fluid or blood coming from your port insertion site.  Your port insertion site feels warm to the touch.  You have pus or a bad smell coming from the port insertion site. Get help right away if:  You have chest pain or shortness of breath.  You have bleeding from your port that you cannot control. Summary  Take care of the port as told by your health care provider.  Change your dressing as told by your health care provider.  Keep all follow-up visits as told by your health care provider. This information is not intended to replace advice given to you by your health care provider. Make sure you discuss any questions you have with your health care provider. Document Released: 01/28/2013 Document Revised: 02/29/2016 Document Reviewed: 02/29/2016 Elsevier Interactive Patient  Education  2017 [employer].

## 2017-10-14 NOTE — Op Note (Signed)
Patient:  Dayna Geurts  DOB:  09-26-1940  MRN:  [id number]   Preop Diagnosis: Metastatic prostate cancer  Postop Diagnosis: Same  Procedure: Port-A-Cath insertion  Surgeon: Aviva Signs, MD  Anes: MAC  Indications: Patient is a 77 year old black male who presents for Port-A-Cath insertion.  He is about to undergo chemotherapy for metastatic prostate cancer.  The risks and benefits of the procedure including bleeding, infection, and pneumothorax were fully explained to the patient, who gave informed consent.  Procedure note: The patient was placed in the Trendelenburg position after the left upper chest was prepped and draped using the usual sterile technique with ChloraPrep.  Surgical site confirmation was performed.  1% Xylocaine was used for local anesthesia.  Incision was made below the left clavicle.  A subcutaneous pocket was formed.  The needle was advanced into the left subclavian vein using Seldinger technique without difficulty.  A guidewire was then advanced into the right atrium under fluoroscopic guidance.  An introducer and peel-away sheath were placed over the guidewire.  The catheter was then inserted through the peel-away sheath and the peel-away sheath was removed.  The catheter was then attached to the port and the port placed in subcutaneous pocket.  Adequate positioning was confirmed by fluoroscopy.  Good backflow blood was noted on aspiration of the port.  The port was flushed with heparin flush.  The subcutaneous layer was reapproximated using a 3-0 Vicryl interrupted suture.  The skin was closed using 4-0 Monocryl subcuticular suture.  Dermabond was applied.  All tape and needle counts were correct at the end of the procedure.  The patient was awakened and transferred to PACU in stable condition.  A chest x-ray will be performed at that time.  Complications: None  EBL: Minimal  Specimen: None

## 2017-10-15 ENCOUNTER — Encounter (HOSPITAL_COMMUNITY): Payer: Self-pay | Admitting: General Surgery

## 2017-10-16 ENCOUNTER — Other Ambulatory Visit (HOSPITAL_COMMUNITY): Payer: Medicaid Other

## 2017-10-17 ENCOUNTER — Ambulatory Visit (HOSPITAL_COMMUNITY): Payer: Medicaid Other

## 2017-10-17 ENCOUNTER — Ambulatory Visit (HOSPITAL_COMMUNITY): Payer: Medicaid Other | Admitting: Hematology

## 2017-10-29 ENCOUNTER — Other Ambulatory Visit: Payer: Self-pay | Admitting: Urology

## 2017-10-29 DIAGNOSIS — N1330 Unspecified hydronephrosis: Secondary | ICD-10-CM

## 2017-10-30 ENCOUNTER — Other Ambulatory Visit (HOSPITAL_COMMUNITY): Payer: Self-pay | Admitting: Hematology

## 2017-10-30 NOTE — Progress Notes (Signed)
START ON PATHWAY REGIMEN - Prostate   Docetaxel 75 mg/m2:   A cycle is every 21 days:     Docetaxel   **Always confirm dose/schedule [facility]:   A cycle is every 12 weeks:     Leuprolide acetate depot    Daily:     Bicalutamide   **Always confirm dose/schedule in your pharmacy ordering system**  Patient Characteristics: Adenocarcinoma, Metastatic, Hormone Naive, High Volume Disease* Current radiographic evidence of distant metastasis<= Yes Histology: Adenocarcinoma AJCC T Category: cTX Gleason Primary: 5 AJCC N Category: N1 Gleason Secondary: 4 AJCC M Category: M1c Gleason Score: 9 AJCC 8 Stage Grouping: IVB PSA Values (ng/mL): ? 20  Intent of Therapy: Non-Curative / Palliative Intent, Discussed with Patient

## 2017-10-31 ENCOUNTER — Other Ambulatory Visit (HOSPITAL_COMMUNITY): Payer: Self-pay | Admitting: Hematology

## 2017-10-31 MED ORDER — LIDOCAINE-PRILOCAINE 2.5-2.5 % EX CREA: TOPICAL_CREAM | CUTANEOUS | 3 refills | Status: DC

## 2017-10-31 MED ORDER — PROCHLORPERAZINE MALEATE 10 MG PO TABS: 10.0000 mg | ORAL_TABLET | Freq: Four times a day (QID) | ORAL | 1 refills | Status: DC | PRN

## 2017-10-31 NOTE — Patient Instructions (Addendum)
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  We are going to treat you with chemotherapy with palliative/ non-curative intent.  You will receive Docetaxel (Taxotere) through your port-a-cath once every 21 days.  You will also be given a medication Pegfilgrastim (Udenyca) 2 days after treatment to help increase your white blood cell (WBC) production.  You will see the doctor regularly throughout treatment.  We monitor your lab work prior to every treatment. The doctor monitors your response to treatment by the way you are feeling, your blood work, and scans periodically.  There will be wait times while you are here for treatment.  It will take about 30 minutes to 1 hour for your lab work to result.  Then there will be wait times while pharmacy mixes your medications.   Premedication: You will be given the following medication prior to treatment each time. Decadron (dexamethasone) - steroid - given to reduce the risk of you having an allergic type reaction to the chemotherapy. Decadron can cause you to feel energized, nervous/anxious/jittery, make you have trouble sleeping, and/or make you feel hot/flushed in the face/neck and/or look pink/red in the face/neck. These side effects will pass as the medication wears off.   Docetaxel (Taxotere)  About This Drug Docetaxel is used to treat cancer. It is given in the vein (IV).  Possible Side Effects  Bone marrow suppression. This is a decrease in the number of white blood cells, red blood cells, and platelets. This may raise your risk of infection, make you tired and weak (fatigue), and raise your risk of bleeding.  Fever in the setting of decreased white blood cells, which is a serious condition that can be lifethreatening  Soreness of the mouth and throat. You may have red areas, white patches, or sores that hurt.  Nausea and vomiting (throwing up)  Constipation (not able to move  bowels)  Diarrhea (loose bowel movements)  Infections  Swelling of your legs, ankles, and/or feet  Changes in the way food and drinks taste  Effects on the nerves are called peripheral neuropathy. You may feel numbness, tingling, or pain in your hands and feet. It may be hard for you to button your clothes, open jars, or walk as usual. The effect on the nerves may get worse with more doses of the drug. These effects get better in some people after the drug is stopped but it does not get better in all people.  Decreased appetite (decreased hunger)  Weakness  Pain  Muscle pain/aching  Trouble breathing  Changes in your nail color, you may have nail loss and/or brittle nail  Hair loss. Hair loss is often temporary, although there have been cases of permanent hair loss reported. Hair loss may happen suddenly or gradually. If you lose hair, you may lose it from your head, face, armpits, pubic area, chest, and/or legs. You may also notice your hair getting thin.  Allergic skin reaction. You may develop blisters on your skin that are filled with fluid or a severe red rash all over your body that may be painful.  Allergic reactions, including anaphylaxis are rare but may happen in some patients. Signs of allergic reaction to this drug may be swelling of the face, feeling like your tongue or throat are swelling, trouble breathing, rash, itching, fever, chills, feeling dizzy, and/or feeling that your heart is beating in a fast or not normal way. If this happens, do not take another dose of  this drug. You should get urgent medical treatment.  Note: Not all possible side effects are included above.  Warnings and Precautions  Severe bone marrow suppression  Severe allergic reactions, including anaphylaxis which can be life-threatening.  Swelling (inflammation) in the colon in the setting of severely low white blood cells, which raises your risk of infection and can be  life-threatening  Severe swelling in the eye or other changes in eyesight  Severe swelling of your legs, ankles and/or feet. Sometimes, fluid can build up in your lungs and/or around your heart causing you trouble breathing.  If you have a history of abnormal liver function, receive high doses of docetaxel, or have a history of lung cancer and have received treatment with a platinum (type of chemotherapy medication), you have an increased risk of death  Severe weakness  This drug may raise your risk of getting a second cancer such as leukemia and myelodysplastic syndrome  Severe peripheral neuropathy and/or numbness, tingling or a sensation of pins and needles in your arms, hands, legs or feet  This drug contains alcohol and may affect your central nervous system. The central nervous system is made up of your brain and spinal cord. You may feel drunk during and after your treatment and it can impair your ability to drive or use machinery for one to two hours after infusion.  Note: Some of the side effects above are very rare. If you have concerns and/or questions, please discuss them with your medical team.  Important Information  This drug may be present in the saliva, tears, sweat, urine, stool, vomit, semen, and vaginal secretions. Talk to your doctor and/or your nurse about the necessary precautions to take during this time.  Treating Side Effects  Manage tiredness by pacing your activities for the day.  Be sure to include periods of rest between energy-draining activities.  Get regular exercise. If you feel too tired to exercise vigorously, try taking a short walk.  To decrease the risk of infection, wash your hands regularly.  Avoid close contact with people who have a cold, the flu, or other infections.  Take your temperature as your doctor or nurse tells you, and whenever you feel like you may have a fever.  To help decrease bleeding, use a soft toothbrush.  Check with your nurse before using dental floss.  Be very careful when using knives or tools.  Use an electric shaver instead of a razor.  Mouth care is very important. Your mouth care should consist of routine, gentle cleaning of your teeth or dentures and rinsing your mouth with a mixture of 1/2 teaspoon of salt in 8 ounces of water or 1/2 teaspoon of baking soda in 8 ounces of water. This should be done at least after each meal and at bedtime.  If you have mouth sores, avoid mouthwash that has alcohol. Also avoid alcohol and smoking because they can bother your mouth and throat.  Ask your doctor or nurse about medicines that are available to help stop or lessen constipation and/ or diarrhea.  If you are not able to move your bowels, check with your doctor or nurse before you use enemas, laxatives, or suppositories.  Drink plenty of fluids (a minimum of eight glasses per day is recommended).  If you throw up or have loose bowel movements, you should drink more fluids so that you do not become dehydrated (lack of water in the body from losing too much fluid).  If you get diarrhea, eat low-fiber  foods that are high in protein and calories and avoid foods that can irritate your digestive tracts or lead to cramping.  To help with nausea and vomiting, eat small, frequent meals instead of three large meals a day. Choose foods and drinks that are at room temperature. Ask your nurse or doctor about other helpful tips and medicine that is available to help or stop lessen these symptoms.  To help with decreased appetite, eat foods high in calories and protein, such as meat, poultry, fish, dry beans, tofu, eggs, nuts, milk, yogurt, cheese, ice cream, pudding, and nutritional supplements.  Consider using sauces and spices to increase taste. Daily exercise, with your doctors approval, may increase your appetite.  Keeping your pain under control is important to your well-being. Please tell  your doctor or nurse if you are experiencing pain.  If you get a rash do not put anything on it unless your doctor or nurse says you may. Keep the area around the rash clean and dry. Ask your doctor for medicine if your rash bothers you.  Keeping your nails moisturized may help with brittleness.  To help with hair loss, wash with a mild shampoo and avoid washing your hair every day.  Avoid rubbing your scalp, pat your hair or scalp dry.  Avoid coloring your hair.  Limit your use of hair spray, electric curlers, blow dryers, and curling irons.  If you are interested in getting a wig, talk to your nurse. You can also call the [employer] at 800-ACS-2345 to find out information about the Look Good, Feel Better program close to where you live. It is a free program where women getting chemotherapy can learn about wigs, turbans and scarves as well as makeup techniques and skin and nail care.  If you have numbness and tingling in your hands and feet, be careful when cooking, walking, and handling sharp objects and hot liquids.  Food and Drug Interactions  There are no known interactions of docetaxel with food.  This drug may interact with other medicines. Tell your doctor and pharmacist about all the prescription and over-the-counter medicines and dietary supplements (vitamins, minerals, herbs and others) that you are taking at this time. Also, check with your doctor or pharmacist before starting any new prescription or over-the-counter medicines, or dietary supplements to make sure that there are no interactions.  When to Call the Doctor Call your doctor or nurse if you have any of these symptoms and/or any new or unusual symptoms:  Fever of 100.4 F (38 C) or higher  Chills  Blurred vision or other changes in eyesight  Easy bruising or bleeding  Wheezing or trouble breathing  Feeling dizzy or lightheaded  Fatigue that interferes with your daily activities   Pain in your mouth or throat that makes it hard to eat or drink  Nausea that stops you from eating or drinking and/or is not relieved by prescribed medicines  Throwing up more than 3 times a day  Lasting loss of appetite or rapid weight loss of five pounds in a week  Diarrhea, 4 times in one day or diarrhea with lack of strength or a feeling of being dizzy  No bowel movement in 3 days or when you feel uncomfortable  Severe abdominal pain that does not go away  Numbness, tingling, or pain your hands and feet  Swelling of legs, ankles, or feet  Weight gain of 5 pounds in one week (fluid retention)  Extreme weakness that interferes with normal  activities  New rash and/or itching  Rash that is not relieved by prescribed medicines  Signs of allergic reaction: swelling of the face, feeling like your tongue or throat are swelling, trouble breathing, rash, itching, fever, chills, feeling dizzy, and/or feeling that your heart is beating in a fast or not normal way  Signs of possible liver problems: dark urine, pale bowel movements, bad stomach pain, feeling very tired and weak, unusual itching, or yellowing of the eyes or skin  Symptoms of being drunk, confusion, or being very sleepy  If you think you may be pregnant  Reproduction Warnings  Pregnancy warning: This drug can have harmful effects on the unborn baby. Women of childbearing potential should use effective methods of birth control during your cancer treatment. Let your doctor know right away if you think you may be pregnant.  Breastfeeding warning: It is not known if this drug passes into breast milk. For this reason, women should talk to their doctor about the risks and benefits of breastfeeding during treatment with this drug because this drug may enter the breast milk and cause harm to a breastfeeding baby.  Fertility warning: Human fertility studies have not been done with this drug. Talk with your doctor or nurse  if you plan to have children. Ask for information on sperm or egg banking.  Pegfilgrastim-xxxx (Neulasta, Neulasta Onpro, Fulphila, Udenyca)  About This Drug  Pegfilgrastim-xxxx belongs to a class of medicines called granulocyte colony-stimulating factor (G-CSF). G-CSF helps the body make more white blood cells. White blood cells help fight infection in your body. This drug is given as an injection under the skin (subcutaneously).  Possible Side Effects  Bone pain  Pain in your arms and/or legs  Note: Each of the side effects above was reported in 5% or greater of patients treated with pegfilgrastim-xxxx. Not all possible side effects are included above.  Warnings and Precautions  Enlargement and inflammation (swelling) of your spleen, which can very rarely rupture and be lifethreatening. Signs of enlargement may be left-sided pain in your abdomen and/or shoulder.  Trouble breathing because of fluid build-up around your lungs and/or inflammation (swelling) of the lungs.  Allergic reactions, including anaphylaxis are rare but may happen in some patients. Signs of allergic reaction to this drug may be swelling of the face, feeling like your tongue or throat are swelling, trouble breathing, rash, itching, fever, chills, feeling dizzy, and/or feeling that your heart is beating in a fast or not normal way. If this happens, do not take another dose of this drug. You should get urgent medical treatment.  Sickle cell crisis in sickle cell patients treated with pegfilgrastim-xxxx  Changes in your kidney function  A rapid increase in your white blood cells may happen  A syndrome where fluid and protein can leak from your blood vessels into your tissues. This can cause a decrease in your blood protein level and blood pressure and fluid can accumulate in your tissues and/or lungs.  The on-body injector uses acrylic adhesive. Caution should be used if you have an allergy to  acrylic adhesives.  Missed or partial doses of the medication have been reported from the on-body injector device not working properly. This could increase your risk of developing severely low white blood cells, which puts you at a risk of severe and life-threatening infections. If you have any problems with the onbody injector, contact your doctor and/or nurse right away.  Inflammation of the aorta- symptoms may include fever, abdominal pain, back pain  and feeling tired.  Note: Some of the side effects above are very rare. If you have concerns and/or questions, please discuss them with your medical team.  Important Information  If you have the on-body injector (OBI) placed, avoid activities such as traveling, driving, or operating heavy machinery during hours 26-29 after it is placed.  How to Take Your Medication  Talk to your doctor, nurse and/or pharmacist for proper preparation, dosing, administration. It is important that you follow the instructions and precautions closely.  Treating Side Effects  Keeping your pain under control is important to your well-being. Please tell your doctor or nurse if you are experiencing pain.  Food and Drug Interactions  There are no known interactions of pegfilgrastim-xxxx with food.  Tell your doctor and pharmacist about all the prescription and over-the-counter medicines and dietary supplements (vitamins, minerals, herbs and others) that you are taking at this time. Also, check with your doctor or pharmacist before starting any new prescription or over-the-counter medicines, or dietary supplements to make sure that there are no interactions.  When to Call the Doctor Call your doctor or nurse if you have any of these symptoms and/or any new or unusual symptoms:  Fever of 100.4 F (38 C) or higher  Chills  Cough  Wheezing and/or trouble breathing  Feeling dizzy or lightheaded  Tiredness that interferes with your daily  activities  Pain in the left side of the abdomen and/or shoulder pain  Back pain  Decreased urine, or very dark urine  Pain that does not go away or is not relieved by prescribed medicine  Swelling of legs, ankles, or feet  Signs of allergic reaction: swelling of the face, feeling like your tongue or throat are swelling, trouble breathing, rash, itching, fever, chills, feeling dizzy, and/or feeling that your heart is beating in a fast or not normal way. If this happens, call 911 for emergency care.  if you think you may be pregnant  Reproduction Warnings  Pregnancy warning: It is not known if this drug may harm an unborn child. For this reason, be sure to talk with your doctor if you are pregnant or planning to become pregnant while receiving this drug. Let your doctor know right away if you think you may be pregnant.  Breastfeeding warning: It is not known if this drug passes into breast milk. For this reason, women should talk to their doctor about the risks and benefits of breastfeeding during treatment with this drug because this drug may enter the breast milk and cause harm to a breastfeeding baby.  Fertility warning: Human fertility studies have not been done with this drug. Talk with your doctor or nurse if you plan to have children. Ask for information on sperm or egg banking.    SELF CARE ACTIVITIES WHILE ON CHEMOTHERAPY:  Hydration Increase your fluid intake 48 hours prior to treatment and drink at least 8 to 12 cups (64 ounces) of water/decaffeinated beverages per day after treatment. You can still have your cup of coffee or soda but these beverages do not count as part of your 8 to 12 cups that you need to drink daily. No alcohol intake.  Medications Continue taking your normal prescription medication as prescribed.  If you start any new herbal or new supplements please let us know first to make sure it is safe.  Mouth Care Have teeth cleaned professionally  before starting treatment. Keep dentures and partial plates clean. Use soft toothbrush and do not use mouthwashes that contain  alcohol. Biotene is a good mouthwash that is available at most pharmacies or may be ordered by calling [phone]. Use warm salt water gargles (1 teaspoon salt per 1 quart warm water) before and after meals and at bedtime. Or you may rinse with 2 tablespoons of three-percent hydrogen peroxide mixed in eight ounces of water. If you are still having problems with your mouth or sores in your mouth please call the clinic. If you need dental work, please let the doctor know before you go for your appointment so that we can coordinate the best possible time for you in regards to your chemo regimen. You need to also let your dentist know that you are actively taking chemo. We may need to do labs prior to your dental appointment.  Skin Care Always use sunscreen that has not expired and with SPF (Sun Protection Factor) of 50 or higher. Wear hats to protect your head from the sun. Remember to use sunscreen on your hands, ears, face, & feet.  Use good moisturizing lotions such as udder cream, eucerin, or even Vaseline. Some chemotherapies can cause dry skin, color changes in your skin and nails.     Avoid long, hot showers or baths.  Use gentle, fragrance-free soaps and laundry detergent.  Use moisturizers, preferably creams or ointments rather than lotions because the thicker consistency is better at preventing skin dehydration. Apply the cream or ointment within 15 minutes of showering. Reapply moisturizer at night, and moisturize your hands every time after you wash them.  Hair Loss (if your doctor says your hair will fall out)   If your doctor says that your hair is likely to fall out, decide before you begin chemo whether you want to wear a wig. You may want to shop before treatment to match your hair color.  Hats, turbans, and scarves can also camouflage hair loss, although  some people prefer to leave their heads uncovered. If you go bare-headed outdoors, be sure to use sunscreen on your scalp.  Cut your hair short. It eases the inconvenience of shedding lots of hair, but it also can reduce the emotional impact of watching your hair fall out.  Dont perm or color your hair during chemotherapy. Those chemical treatments are already damaging to hair and can enhance hair loss. Once your chemo treatments are done and your hair has grown back, its OK to resume dyeing or perming hair. With chemotherapy, hair loss is almost always temporary. But when it grows back, it may be a different color or texture. In older adults who still had hair color before chemotherapy, the new growth may be completely gray.  Often, new hair is very fine and soft.  Infection Prevention Please wash your hands for at least 30 seconds using warm soapy water. Handwashing is the #1 way to prevent the spread of germs. Stay away from sick people or people who are getting over a cold. If you develop respiratory systems such as green/yellow mucus production or productive cough or persistent cough let us know and we will see if you need an antibiotic. It is a good idea to keep a pair of gloves on when going into grocery stores/[employer] to decrease your risk of coming into contact with germs on the carts, etc. Carry alcohol hand gel with you at all times and use it frequently if out in public. If your temperature reaches 100.5 or higher please call the clinic and let us know.  If it is after hours or on  the weekend please go to the ER if your temperature is over 100.5.  Please have your own personal thermometer at home to use.    Sex and bodily fluids If you are going to have sex, a condom must be used to protect the person that isnt taking chemotherapy. Chemo can decrease your libido (sex drive). For a few days after chemotherapy, chemotherapy can be excreted through your bodily fluids.  When using the toilet  please close the lid and flush the toilet twice.  Do this for a few day after you have had chemotherapy.   Effects of chemotherapy on your sex life Some changes are simple and wont last long. They won't affect your sex life permanently. Sometimes you may feel:  too tired  not strong enough to be very active  sick or sore   not in the mood  anxious or low Your anxiety might not seem related to sex. For example, you may be worried about the cancer and how your treatment is going. Or you may be worried about money, or about how you family are coping with your illness. These things can cause stress, which can affect your interest in sex. Its important to talk to your partner about how you feel. Remember - the changes to your sex life don't usually last long. There's usually no medical reason to stop having sex during chemo. The drugs won't have any long term physical effects on your performance or enjoyment of sex. Cancer can't be passed on to your partner during sex  Contraception Its important to use reliable contraception during treatment. Avoid getting pregnant while you or your partner are having chemotherapy. This is because the drugs may harm the baby. Sometimes chemotherapy drugs can leave a man or woman infertile.  This means you would not be able to have children in the future. You might want to talk to someone about permanent infertility. It can be very difficult to learn that you may no longer be able to have children. Some people find counselling helpful. There might be ways to preserve your fertility, although this is easier for men than for women. You may want to speak to a fertility expert. You can talk about sperm banking or harvesting your eggs. You can also ask about other fertility options, such as donor eggs. If you have or have had breast cancer, your doctor might advise you not to take the contraceptive pill. This is because the hormones in it might affect the cancer.  It  is not known for sure whether or not chemotherapy drugs can be passed on through semen or secretions from the vagina. Because of this some doctors advise people to use a barrier method if you have sex during treatment. This applies to vaginal, anal or oral sex. Generally, doctors advise a barrier method only for the time you are actually having the treatment and for about a week after your treatment. Advice like this can be worrying, but this does not mean that you have to avoid being intimate with your partner. You can still have close contact with your partner and continue to enjoy sex.  Animals If you have cats or birds we just ask that you not change the litter or change the cage.  Please have someone else do this for you while you are on chemotherapy.   Food Safety During and After Cancer Treatment Food safety is important for people both during and after cancer treatment. Cancer and cancer treatments, such as chemotherapy,  radiation therapy, and stem cell/bone marrow transplantation, often weaken the immune system. This makes it harder for your body to protect itself from foodborne illness, also called food poisoning. Foodborne illness is caused by eating food that contains harmful bacteria, parasites, or viruses.  Foods to avoid Some foods have a higher risk of becoming tainted with bacteria. These include:  Unwashed fresh fruit and vegetables, especially leafy vegetables that can hide dirt and other contaminants  Raw sprouts, such as alfalfa sprouts  Raw or undercooked beef, especially ground beef, or other raw or undercooked meat and poultry  Fatty, fried, or spicy foods immediately before or after treatment.  These can sit heavy on your stomach and make you feel nauseous.  Raw or undercooked shellfish, such as oysters.  Sushi and sashimi, which often contain raw fish.   Unpasteurized beverages, such as unpasteurized fruit juices, raw milk, raw yogurt, or cider  Undercooked eggs,  such as soft boiled, over easy, and poached; raw, unpasteurized eggs; or foods made with raw egg, such as homemade raw cookie dough and homemade mayonnaise Simple steps for food safety Shop smart.  Do not buy food stored or displayed in an unclean area.  Do not buy bruised or damaged fruits or vegetables.  Do not buy cans that have cracks, dents, or bulges.  Pick up foods that can spoil at the end of your shopping trip and store them in a cooler on the way home. Prepare and clean up foods carefully.  Rinse all fresh fruits and vegetables under running water, and dry them with a clean towel or paper towel.  Clean the top of cans before opening them.  After preparing food, wash your hands for 20 seconds with hot water and soap. Pay special attention to areas between fingers and under nails.  Clean your utensils and dishes with hot water and soap.  Disinfect your kitchen and cutting boards using 1 teaspoon of liquid, unscented bleach mixed into 1 quart of water.   Dispose of old food.  Eat canned and packaged food before its expiration date (the use by or best before date).  Consume refrigerated leftovers within 3 to 4 days. After that time, throw out the food. Even if the food does not smell or look spoiled, it still may be unsafe. Some bacteria, such as Listeria, can grow even on foods stored in the refrigerator if they are kept for too long. Take precautions when eating out.  At restaurants, avoid buffets and salad bars where food sits out for a long time and comes in contact with many people. Food can become contaminated when someone with a virus, often a norovirus, or another bug handles it.  Put any leftover food in a to-go container yourself, rather than having the server do it. And, refrigerate leftovers as soon as you get home.  Choose restaurants that are clean and that are willing to prepare your food as you order it cooked. MEDICATIONS:  Compazine/Prochlorperazine 10mg  tablet. Take 1 tablet every 6 hours as needed for nausea/vomiting. (This can make you sleepy)   EMLA cream. Apply a quarter size amount to port site 1 hour prior to chemo. Do not rub in. Cover with plastic wrap.   Over-the-Counter Meds:  Miralax 1 capful in 8 oz of fluid daily. May increase to two times a day if needed. This is a stool softener. If this doesn't work proceed you can add:  Senokot S-start with 1 tablet two times a day and increase to 4 tablets two times a day if needed. (Total of 8 tablets in a 24 hour period). This is a stimulant laxative.   Call us if this does not help your bowels move.   Imodium 2mg  capsule. Take 2 capsules after the 1st loose stool and then 1 capsule every 2 hours until you go a total of 12 hours without having a loose stool. Call the [facility] if loose stools continue. If diarrhea occurs @ bedtime, take 2 capsules @ bedtime. Then take 2 capsules every 4 hours until morning. Call [facility].    Diarrhea Sheet   If you are having loose stools/diarrhea, please purchase Imodium and begin taking as outlined:  At the first sign of poorly formed or loose stools you should begin taking Imodium (loperamide) 2 mg capsules.  Take two caplets (4mg ) followed by one caplet (2mg ) every 2 hours until you have had no diarrhea for 12 hours.  During the night take two caplets (4mg ) at bedtime and continue every 4 hours during the night until the morning.  Stop taking Imodium only after there is no sign of diarrhea for 12 hours.    Always call the [facility] if you are having loose stools/diarrhea that you can't get under control.  Loose stools/diarrhea leads to dehydration (loss of water) in your body.  We have other options of trying to get the loose stools/diarrhea to stop but you must let us  know!    Constipation Sheet *Miralax in 8 oz. of fluid daily.  May increase to two times a day if needed.  This is a stool softener.  If this not enough to keep your bowel regular:  You can add:  *Senokot S, start with one tablet twice a day and can increase to 4 tablets twice a day if needed.  This is a stimulant laxative.   Sometimes when you take pain medication you need BOTH a medicine to keep your stool soft and a medicine to help your bowel push it out!  Please call if the above does not work for you.   Do not go more than 2 days without a bowel movement.  It is very important that you do not become constipated.  It will make you feel sick to your stomach (nausea) and can cause abdominal pain and vomiting.    Nausea Sheet   Compazine/Prochlorperazine 10mg  tablet. Take 1 tablet every 6 hours as needed for nausea/vomiting. (This can make you sleepy)  If you are having persistent nausea (nausea that does not stop) please call the [facility] and let us know the amount of nausea that you are experiencing.  If you begin to vomit, you need to call the [facility] and if it is the weekend and you have vomited more than one time and cant get it to stop-go to the Emergency Room.  Persistent nausea/vomiting can lead to dehydration (loss of fluid in your body) and will make you feel  terrible.   Ice chips, sips of clear liquids, foods that are @ room temperature, crackers, and toast tend to be better tolerated.     SYMPTOMS TO REPORT AS SOON AS POSSIBLE AFTER TREATMENT:  FEVER GREATER THAN 100.5 F  CHILLS WITH OR WITHOUT FEVER  NAUSEA AND VOMITING THAT IS NOT CONTROLLED WITH YOUR NAUSEA MEDICATION  UNUSUAL SHORTNESS OF BREATH  UNUSUAL BRUISING OR BLEEDING  TENDERNESS IN MOUTH AND THROAT WITH OR WITHOUT PRESENCE OF ULCERS  URINARY PROBLEMS  BOWEL PROBLEMS  UNUSUAL RASH    Wear comfortable clothing and clothing appropriate for easy access to any Portacath or PICC  line. Let us know if there is anything that we can do to make your therapy better!    What to do if you need assistance after hours or on the weekends: CALL [phone].  HOLD on the line, do not hang up.  You will hear multiple messages but at the end you will be connected with a nurse triage line.  They will contact the doctor if necessary.  Most of the time they will be able to assist you.  Do not call the hospital operator.      I have been informed and understand all of the instructions given to me and have received a copy. I have been instructed to call the clinic [phone] or my family physician as soon as possible for continued medical care, if indicated. I do not have any more questions at this time but understand that I may call the [facility] or the Patient Navigator at [phone] during office hours should I have questions or need assistance in obtaining follow-up care.

## 2017-10-31 NOTE — Progress Notes (Signed)
Chemotherapy teaching information pulled together. Patient will be taught at bedside.

## 2017-11-12 ENCOUNTER — Encounter (HOSPITAL_COMMUNITY): Payer: Self-pay

## 2017-11-12 ENCOUNTER — Inpatient Hospital Stay (HOSPITAL_COMMUNITY): Payer: Medicaid Other | Attending: Hematology

## 2017-11-12 DIAGNOSIS — Z5111 Encounter for antineoplastic chemotherapy: Secondary | ICD-10-CM | POA: Insufficient documentation

## 2017-11-12 DIAGNOSIS — I10 Essential (primary) hypertension: Secondary | ICD-10-CM | POA: Insufficient documentation

## 2017-11-12 DIAGNOSIS — C7951 Secondary malignant neoplasm of bone: Secondary | ICD-10-CM | POA: Insufficient documentation

## 2017-11-12 DIAGNOSIS — C61 Malignant neoplasm of prostate: Secondary | ICD-10-CM | POA: Insufficient documentation

## 2017-11-15 ENCOUNTER — Ambulatory Visit (HOSPITAL_COMMUNITY)
Admission: RE | Admit: 2017-11-15 | Discharge: 2017-11-15 | Disposition: A | Discharge status: Home / Self Care | Payer: Medicaid Other | Source: Ambulatory Visit | Attending: Urology | Admitting: Urology

## 2017-11-15 DIAGNOSIS — Z8546 Personal history of malignant neoplasm of prostate: Secondary | ICD-10-CM | POA: Insufficient documentation

## 2017-11-15 DIAGNOSIS — N1330 Unspecified hydronephrosis: Secondary | ICD-10-CM | POA: Diagnosis not present

## 2017-11-18 ENCOUNTER — Inpatient Hospital Stay (HOSPITAL_COMMUNITY): Payer: Medicaid Other

## 2017-11-18 DIAGNOSIS — C61 Malignant neoplasm of prostate: Secondary | ICD-10-CM | POA: Diagnosis present

## 2017-11-18 DIAGNOSIS — I10 Essential (primary) hypertension: Secondary | ICD-10-CM | POA: Diagnosis not present

## 2017-11-18 DIAGNOSIS — C7951 Secondary malignant neoplasm of bone: Secondary | ICD-10-CM | POA: Diagnosis not present

## 2017-11-18 DIAGNOSIS — Z5111 Encounter for antineoplastic chemotherapy: Secondary | ICD-10-CM | POA: Diagnosis present

## 2017-11-18 LAB — CBC WITH DIFFERENTIAL/PLATELET
Basophils Absolute: 0 10*3/uL (ref 0.0–0.1)
Basophils Relative: 0 %
Eosinophils Absolute: 0 10*3/uL (ref 0.0–0.7)
Eosinophils Relative: 0 %
HEMATOCRIT: 34 % — AB (ref 39.0–52.0)
HEMOGLOBIN: 11.3 g/dL — AB (ref 13.0–17.0)
LYMPHS ABS: 1.9 10*3/uL (ref 0.7–4.0)
LYMPHS PCT: 48 %
MCH: 29.3 pg (ref 26.0–34.0)
MCHC: 33.2 g/dL (ref 30.0–36.0)
MCV: 88.1 fL (ref 78.0–100.0)
Monocytes Absolute: 0.3 10*3/uL (ref 0.1–1.0)
Monocytes Relative: 7 %
NEUTROS ABS: 1.8 10*3/uL (ref 1.7–7.7)
NEUTROS PCT: 45 %
Platelets: 221 10*3/uL (ref 150–400)
RBC: 3.86 MIL/uL — ABNORMAL LOW (ref 4.22–5.81)
RDW: 14.4 % (ref 11.5–15.5)
WBC: 3.9 10*3/uL — ABNORMAL LOW (ref 4.0–10.5)

## 2017-11-18 LAB — COMPREHENSIVE METABOLIC PANEL
ALBUMIN: 3.8 g/dL (ref 3.5–5.0)
ALK PHOS: 344 U/L — AB (ref 38–126)
ALT: 15 U/L (ref 0–44)
AST: 21 U/L (ref 15–41)
Anion gap: 4 — ABNORMAL LOW (ref 5–15)
BILIRUBIN TOTAL: 0.7 mg/dL (ref 0.3–1.2)
BUN: 22 mg/dL (ref 8–23)
CALCIUM: 9.2 mg/dL (ref 8.9–10.3)
CHLORIDE: 108 mmol/L (ref 98–111)
CO2: 28 mmol/L (ref 22–32)
CREATININE: 1.75 mg/dL — AB (ref 0.61–1.24)
GFR calc Af Amer: 42 mL/min — ABNORMAL LOW (ref >60)
GFR, EST NON AFRICAN AMERICAN: 36 mL/min — AB (ref >60)
Glucose, Bld: 110 mg/dL — ABNORMAL HIGH (ref 70–99)
Potassium: 5.2 mmol/L — ABNORMAL HIGH (ref 3.5–5.1)
Sodium: 140 mmol/L (ref 135–145)
Total Protein: 7.2 g/dL (ref 6.5–8.1)

## 2017-11-19 ENCOUNTER — Inpatient Hospital Stay (HOSPITAL_COMMUNITY): Payer: Medicaid Other

## 2017-11-19 ENCOUNTER — Inpatient Hospital Stay (HOSPITAL_COMMUNITY): Payer: Medicaid Other | Admitting: Dietician

## 2017-11-19 ENCOUNTER — Inpatient Hospital Stay (HOSPITAL_BASED_OUTPATIENT_CLINIC_OR_DEPARTMENT_OTHER): Payer: Medicaid Other | Admitting: Hematology

## 2017-11-19 ENCOUNTER — Telehealth (HOSPITAL_COMMUNITY): Payer: Self-pay | Admitting: Hematology

## 2017-11-19 ENCOUNTER — Encounter (HOSPITAL_COMMUNITY): Payer: Self-pay | Admitting: Hematology

## 2017-11-19 ENCOUNTER — Other Ambulatory Visit: Payer: Self-pay

## 2017-11-19 VITALS — BP 162/82 | HR 52 | Temp 98.0°F | Resp 16 | Wt 160.4 lb

## 2017-11-19 VITALS — BP 146/78 | HR 57 | Temp 98.4°F | Resp 18

## 2017-11-19 DIAGNOSIS — C61 Malignant neoplasm of prostate: Secondary | ICD-10-CM

## 2017-11-19 DIAGNOSIS — C7951 Secondary malignant neoplasm of bone: Secondary | ICD-10-CM

## 2017-11-19 DIAGNOSIS — I10 Essential (primary) hypertension: Secondary | ICD-10-CM

## 2017-11-19 DIAGNOSIS — Z5111 Encounter for antineoplastic chemotherapy: Secondary | ICD-10-CM | POA: Diagnosis not present

## 2017-11-19 MED ORDER — DENOSUMAB 120 MG/1.7ML ~~LOC~~ SOLN
120.0000 mg | Freq: Once | SUBCUTANEOUS | Status: DC
  Filled 2017-11-19: qty 1.7

## 2017-11-19 MED ORDER — DIPHENHYDRAMINE HCL 50 MG/ML IJ SOLN
25.0000 mg | Freq: Once | INTRAMUSCULAR | Status: AC
  Administered 2017-11-19: 25 mg via INTRAVENOUS
  Filled 2017-11-19: qty 1

## 2017-11-19 MED ORDER — FAMOTIDINE IN NACL 20-0.9 MG/50ML-% IV SOLN
20.0000 mg | Freq: Once | INTRAVENOUS | Status: AC
  Administered 2017-11-19: 20 mg via INTRAVENOUS
  Filled 2017-11-19: qty 50

## 2017-11-19 MED ORDER — HEPARIN SOD (PORK) LOCK FLUSH 100 UNIT/ML IV SOLN
500.0000 [IU] | Freq: Once | INTRAVENOUS | Status: AC | PRN
  Administered 2017-11-19: 500 [IU]

## 2017-11-19 MED ORDER — DEXAMETHASONE SODIUM PHOSPHATE 10 MG/ML IJ SOLN
10.0000 mg | Freq: Once | INTRAMUSCULAR | Status: AC
  Administered 2017-11-19: 10 mg via INTRAVENOUS
  Filled 2017-11-19: qty 1

## 2017-11-19 MED ORDER — ONDANSETRON 8 MG PO TBDP
8.0000 mg | ORAL_TABLET | Freq: Once | ORAL | Status: AC
  Administered 2017-11-19: 8 mg via ORAL
  Filled 2017-11-19: qty 1

## 2017-11-19 MED ORDER — SODIUM CHLORIDE 0.9 % IV SOLN
Freq: Once | INTRAVENOUS | Status: AC
  Administered 2017-11-19: 11:00:00 via INTRAVENOUS

## 2017-11-19 MED ORDER — FULVESTRANT 250 MG/5ML IM SOLN
INTRAMUSCULAR | Status: AC
  Filled 2017-11-19: qty 10

## 2017-11-19 MED ORDER — SODIUM CHLORIDE 0.9 % IV SOLN
75.0000 mg/m2 | Freq: Once | INTRAVENOUS | Status: AC
  Administered 2017-11-19: 140 mg via INTRAVENOUS
  Filled 2017-11-19: qty 14

## 2017-11-19 MED ORDER — SODIUM CHLORIDE 0.9% FLUSH
10.0000 mL | INTRAVENOUS | Status: DC | PRN
  Administered 2017-11-19: 10 mL
  Filled 2017-11-19: qty 10

## 2017-11-19 NOTE — Progress Notes (Signed)
Carlos Harper tolerated Taxotere infusion well without complaints or incident. Pt did not receive the Xgeva injection today since he recently had 2 teeth extracted so it will be given with next cycle of chemotherapy. VSS upon discharge. Pt to return Thursday morning for Neulasta injection. Pt and family member instructed in chemotherapy, side effects and when to call the MD. Both verbalized understanding. Pt discharged self ambulatory in satisfactory condition accompanied by his family member

## 2017-11-19 NOTE — Progress Notes (Signed)
1020 Labs reviewed and patient seen by Dr. Delton Coombes who approved patient for treatment today.
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
## 2017-11-19 NOTE — Telephone Encounter (Signed)
PT VERY OFF BALANCED WHILE LEAVING OFFICE. HE WAS OFFERED A WHEELCHAIR BY MYSELF AND TORI/RN. PT REFUSED

## 2017-11-19 NOTE — Progress Notes (Signed)
11/19/17  Patient will receive Neulasta verses Udenyca due to new Medicaid Formulary.  Neulasta is on formulary for Medicaid for 2019.  Orders adjusted to reflect the change.  Henreitta Leber, PharmD

## 2017-11-19 NOTE — Progress Notes (Signed)
Nutrition Assessment  Reason for Assessment: Malnutrition Screening Tool  ASSESSMENT:  77 y/o male with little prior medical surveillance, presented to PCP mid may with complaints of back pain. Labs drawn showed PSA of >900. Full workup would reveal widely metastatic prostate cancer with several bone mets to spine, ribs, sternum, shoulders, femurs, pelvis. S/P bilateral orchiectomy, cystoscopy w/ fulguration of bladder neck and attempted R ureteral stent. Beginning palliative chemotherapy today (7/30)  Patient seen today with his sister. Patient is generally well appearing. In fact, patient notes that, other than some mild, vague back pain, he was completely asymptomatic. His back pain led him to seek medical care for the first time in many years and "that's when they found all this".   Denies any weight or appetite changes prior to dx. However, he says he has had acute weight loss related to having numerous teeth pulled. He says he has about 13 teeth pulled over 3 separate sessions. Being his mouth is sore, his intake has decreased and he has been relying on soup and mashed potatoes.   He says his UBW is 167-170 lbs. The most he ever weighed was 198. Based on his report, he is down ~7 lbs in the past couple months, though there is no objective documentation to confirm this-per chart, wt very stable x 2 months. He notes he does not want to gain weight  RD went through pts typical eating pattern:  Breakfast: Boost  (not sure which one, "the most expensive one") Lunch: "Chicken Mostly"-take out from [employer]: home cooked meal. Yesterday was pinto beans, cabbage. Beverages: 1 soda/d. Mostly drinks Juice. Does not drink milk at all.  Other: Patient notes he has not been eating as much high-fat products because he has been told "grease is bad for me".  As noted above, he usually eats only 2x/day + supplement in morning.   At this time, denies any n/v/d. He does have constipation, which he relates  to the calcium and vit D supplements. He takes a softener mixed with a laxative at home.   Wt Readings from Last 10 Encounters:  11/19/17 160 lb 6.4 oz (72.8 kg)  10/08/17 159 lb (72.1 kg)  10/08/17 159 lb 8 oz (72.3 kg)  09/27/17 158 lb 6.4 oz (71.8 kg)  09/17/17 160 lb 4 oz (72.7 kg)  07/22/15 173 lb (78.5 kg)  06/10/15 170 lb (77.1 kg)  05/18/15 170 lb (77.1 kg)  05/13/15 170 lb (77.1 kg)   MEDICATIONS:  Chemotherapy: Docetaxel Supportive Medications: Hydrocodone, Compazine  LABS:  K: 5.2, Creat: 1.75 (appears same as last month), Albumin:5.8, BG: 90-110  Recent Labs  Lab 11/18/17 1020  NA 140  K 5.2*  CL 108  CO2 28  BUN 22  CREATININE 1.75*  CALCIUM 9.2  GLUCOSE 110*   ANTHROPOMETRICS: Height:  Ht Readings from Last 1 Encounters:  10/08/17 5\' 7"  (1.702 m)   Weight:  Wt Readings from Last 1 Encounters:  11/19/17 160 lb 6.4 oz (72.8 kg)   BMI:  BMI Readings from Last 1 Encounters:  11/19/17 25.12 kg/m   UBW: 167-170 (per pt) IBW: 67.27 kg  ESTIMATED ENERGY NEEDS:  Kcal: 2200-2400 (30-33 kcal/kg bw)  Protein: 95-110g Pro (1.3-1.5 g/kg bw) Fluid:  1.8-2.2 L (25-30 ml/kg bw)  NUTRITION - FOCUSED PHYSICAL EXAM: Unable to perform (long pants, shirt w/ flannel)  NUTRITION DIAGNOSIS:  Increased protein/kcal needs related to cancer and cancer related treatments AEB by estimated nutritional requirements for this condition/ treatment  DOCUMENTATION  CODES:  Not applicable at this time  INTERVENTION:  Reviewed with patient basic oncology nutrition therapy. Discussed how cancer treatments, or the cancer itself, can lessen appetite or bring symptoms that lessen his intake. Emphasized importance of eating high kcal/protein foods and eating frequently to ensure sufficient nutrition.   While he does not need to regain weight, RD detailed why he should not desire to lose weight and how loss relates to poorer outcomes. He acknowledges this.   We reviewed the  handout titled "Increasing Calories and Protein". RD listed high protein foods. Gave examples of appropriate vs inappropriate  meal choices. He has removed a large amount of fat from his diet over the years in an effort to avoid HTN/HLD etc. RD noted that cancer takes precedence and if he starts losing weight, he would benefit from switching back to higher fat items.   He has constipation. RD noted his beverage choices are not ideal for hydration. Recommended switching out juice for low sugar sports drink, water, or milk, but he does not drink milk at all.   RD gave coupons for Ensure. He notes he prefers boost over ensure.   Today is first chemo tx. He is urged to reach out to RD if he experiences any side effects limiting his appetite. Pt/sister receptive to all recommendations.   GOAL:  Patient will meet greater than or equal to 90% of their needs  MONITOR:  PO intake, Labs, Weight trends, I & O's  Next Visit: 8/20  Burtis Junes RD, LDN, CNSC Clinical Nutrition Available Tues-Sat via Pager: [phone] 11/19/2017 1:43 PM

## 2017-11-19 NOTE — Assessment & Plan Note (Signed)
1.  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  However he reported back pain and chest wall pain for 3 months which improved after orchiectomy. - Bone scan on 09/12/2017 shows extensive abnormal uptake in the shoulders, sternum, ribs, throughout the spine, pelvis and proximal to mid femurs.  CT scan of the abdomen and pelvis without contrast on 09/12/2017 shows extensive retroperitoneal adenopathy,  right hydronephrosis. - Underwent prostate biopsy (Gleason 9), bilateral orchiectomy, cystoscopy with fulguration of bladder neck, attempted right ureteral stent placement on 09/17/2017. -Most recent PSA on 09/24/2017 has improved to 422. - As he has high-volume disease, I have recommended combining ADT (orchiectomy) with either docetaxel or Abiraterone as it significantly prolongs overall survival compared to ADT alone.  He wanted to go with docetaxel as it has short course of treatment with 6 cycles.  Today we have discussed the side effects of docetaxel in detail, including but not limited to allergic reactions, cytopenias, life-threatening infections, peripheral neuropathy, nail changes among others.  We will start his first cycle of chemotherapy today with Neulasta support.  I will see him back in 3 weeks for follow-up prior to start of second cycle.  We will continue to monitor PSA with each cycle.  We plan to repeat bone scan and CT scan after 2-3 cycles. - We will consider checking his tumor tissue for somatic mutations by doing foundation 1 testing.  We will also do germline mutation testing.  2.  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  Because of increased bone turnover and decreased bone mineral density, I have recommended monthly denosumab injections to decrease skeletal related events.  He is taking calcium and vitamin D supplements.  He had about 13 teeth pulled when he saw his dentist.  I plan to  start him on denosumab with cycle 2.  3.  Hypertension: -He is on ramipril 2.5 mg daily.  His creatinine went up to 1.7 today.  Potassium is 5.2.  I have told him to cut back on drinking fruit juices.

## 2017-11-19 NOTE — Patient Instructions (Signed)
[place] at [facility]  Discharge Instructions:  You received your Docetaxel infusion today. Call clinic for any questions or concerns. Follow up as scheduled. _______________________________________________________________  Thank you for choosing [place] at [facility] to provide your oncology and hematology care.  To afford each patient quality time with our providers, please arrive at least 15 minutes before your scheduled appointment.  You need to re-schedule your appointment if you arrive 10 or more minutes late.  We strive to give you quality time with our providers, and arriving late affects you and other patients whose appointments are after yours.  Also, if you no show three or more times for appointments you may be dismissed from the clinic.  Again, thank you for choosing [place] at [place] hope is that these requests will allow you access to exceptional care and in a timely manner. _______________________________________________________________  If you have questions after your visit, please contact our office at [phone] between the hours of 8:30 a.m. and 5:00 p.m. Voicemails left after 4:30 p.m. will not be returned until the following business day. _______________________________________________________________  For prescription refill requests, have your pharmacy contact our office. _______________________________________________________________  Recommendations made by the consultant and any test results will be sent to your referring physician. _______________________________________________________________

## 2017-11-20 ENCOUNTER — Telehealth (HOSPITAL_COMMUNITY): Payer: Self-pay | Admitting: *Deleted

## 2017-11-20 NOTE — Telephone Encounter (Signed)
Contacted for 24 hour f/u post first Taxotere infusion.  Denies any s/s, no complaints, states, "I feel just fine".  Will be in clinic tomorrow to receive Neulasta injection.

## 2017-11-21 ENCOUNTER — Ambulatory Visit (HOSPITAL_COMMUNITY): Payer: Medicaid Other

## 2017-11-21 ENCOUNTER — Inpatient Hospital Stay (HOSPITAL_COMMUNITY): Payer: Medicaid Other | Attending: Hematology

## 2017-11-21 VITALS — BP 135/73 | HR 55 | Temp 98.0°F | Resp 18

## 2017-11-21 DIAGNOSIS — C7951 Secondary malignant neoplasm of bone: Secondary | ICD-10-CM | POA: Diagnosis not present

## 2017-11-21 DIAGNOSIS — Z5189 Encounter for other specified aftercare: Secondary | ICD-10-CM | POA: Diagnosis not present

## 2017-11-21 DIAGNOSIS — I10 Essential (primary) hypertension: Secondary | ICD-10-CM | POA: Insufficient documentation

## 2017-11-21 DIAGNOSIS — Z5111 Encounter for antineoplastic chemotherapy: Secondary | ICD-10-CM | POA: Insufficient documentation

## 2017-11-21 DIAGNOSIS — C61 Malignant neoplasm of prostate: Secondary | ICD-10-CM | POA: Diagnosis present

## 2017-11-21 MED ORDER — PEGFILGRASTIM INJECTION 6 MG/0.6ML ~~LOC~~
6.0000 mg | PREFILLED_SYRINGE | Freq: Once | SUBCUTANEOUS | Status: AC
  Administered 2017-11-21: 6 mg via SUBCUTANEOUS
  Filled 2017-11-21: qty 0.6

## 2017-11-21 NOTE — Patient Instructions (Signed)
[place] at [facility]  Discharge Instructions:  You received your Neulasta injection today. Follow up as scheduled. Call clinic for any questions or concerns. _______________________________________________________________  Thank you for choosing [place] at [facility] to provide your oncology and hematology care.  To afford each patient quality time with our providers, please arrive at least 15 minutes before your scheduled appointment.  You need to re-schedule your appointment if you arrive 10 or more minutes late.  We strive to give you quality time with our providers, and arriving late affects you and other patients whose appointments are after yours.  Also, if you no show three or more times for appointments you may be dismissed from the clinic.  Again, thank you for choosing [place] at [place] hope is that these requests will allow you access to exceptional care and in a timely manner. _______________________________________________________________  If you have questions after your visit, please contact our office at [phone] between the hours of 8:30 a.m. and 5:00 p.m. Voicemails left after 4:30 p.m. will not be returned until the following business day. _______________________________________________________________  For prescription refill requests, have your pharmacy contact our office. _______________________________________________________________  Recommendations made by the consultant and any test results will be sent to your referring physician. _______________________________________________________________

## 2017-11-21 NOTE — Progress Notes (Signed)
Carlos Harper tolerated Neulasta injection well without incident or complaint. No pain, redness, or swelling noted at injection site. Educated on possible side effects and management. Verbalizes understanding and will call clinic for any further questions. VSS. Discharged self ambulatory in satisfactory condition.

## 2017-12-06 ENCOUNTER — Ambulatory Visit: Payer: Medicaid Other | Admitting: Urology

## 2017-12-06 DIAGNOSIS — N130 Hydronephrosis with ureteropelvic junction obstruction: Secondary | ICD-10-CM

## 2017-12-06 DIAGNOSIS — C61 Malignant neoplasm of prostate: Secondary | ICD-10-CM | POA: Diagnosis not present

## 2017-12-06 DIAGNOSIS — C7951 Secondary malignant neoplasm of bone: Secondary | ICD-10-CM | POA: Diagnosis not present

## 2017-12-10 ENCOUNTER — Encounter (HOSPITAL_COMMUNITY): Payer: Self-pay | Admitting: Hematology

## 2017-12-10 ENCOUNTER — Inpatient Hospital Stay (HOSPITAL_BASED_OUTPATIENT_CLINIC_OR_DEPARTMENT_OTHER): Payer: Medicaid Other | Admitting: Hematology

## 2017-12-10 ENCOUNTER — Other Ambulatory Visit: Payer: Self-pay

## 2017-12-10 ENCOUNTER — Inpatient Hospital Stay (HOSPITAL_COMMUNITY): Payer: Medicaid Other

## 2017-12-10 ENCOUNTER — Inpatient Hospital Stay (HOSPITAL_COMMUNITY): Payer: Medicaid Other | Admitting: Dietician

## 2017-12-10 VITALS — BP 114/65 | HR 65 | Temp 98.3°F | Resp 18 | Wt 160.6 lb

## 2017-12-10 VITALS — BP 121/72 | HR 60 | Temp 98.1°F | Resp 18

## 2017-12-10 DIAGNOSIS — I10 Essential (primary) hypertension: Secondary | ICD-10-CM | POA: Diagnosis not present

## 2017-12-10 DIAGNOSIS — Z5111 Encounter for antineoplastic chemotherapy: Secondary | ICD-10-CM | POA: Diagnosis not present

## 2017-12-10 DIAGNOSIS — C61 Malignant neoplasm of prostate: Secondary | ICD-10-CM | POA: Diagnosis not present

## 2017-12-10 DIAGNOSIS — C7951 Secondary malignant neoplasm of bone: Secondary | ICD-10-CM | POA: Diagnosis not present

## 2017-12-10 LAB — CBC WITH DIFFERENTIAL/PLATELET
BASOS ABS: 0.1 10*3/uL (ref 0.0–0.1)
Basophils Relative: 2 %
Eosinophils Absolute: 0 10*3/uL (ref 0.0–0.7)
Eosinophils Relative: 0 %
HEMATOCRIT: 27.7 % — AB (ref 39.0–52.0)
HEMOGLOBIN: 9 g/dL — AB (ref 13.0–17.0)
Lymphocytes Relative: 34 %
Lymphs Abs: 1.4 10*3/uL (ref 0.7–4.0)
MCH: 29.4 pg (ref 26.0–34.0)
MCHC: 32.5 g/dL (ref 30.0–36.0)
MCV: 90.5 fL (ref 78.0–100.0)
Monocytes Absolute: 0.5 10*3/uL (ref 0.1–1.0)
Monocytes Relative: 11 %
NEUTROS ABS: 2.2 10*3/uL (ref 1.7–7.7)
NEUTROS PCT: 53 %
Platelets: 253 10*3/uL (ref 150–400)
RBC: 3.06 MIL/uL — ABNORMAL LOW (ref 4.22–5.81)
RDW: 15.6 % — ABNORMAL HIGH (ref 11.5–15.5)
WBC: 4.2 10*3/uL (ref 4.0–10.5)

## 2017-12-10 LAB — COMPREHENSIVE METABOLIC PANEL
ALK PHOS: 204 U/L — AB (ref 38–126)
ALT: 33 U/L (ref 0–44)
ANION GAP: 6 (ref 5–15)
AST: 23 U/L (ref 15–41)
Albumin: 3.7 g/dL (ref 3.5–5.0)
BILIRUBIN TOTAL: 0.8 mg/dL (ref 0.3–1.2)
BUN: 26 mg/dL — ABNORMAL HIGH (ref 8–23)
CO2: 25 mmol/L (ref 22–32)
Calcium: 8.7 mg/dL — ABNORMAL LOW (ref 8.9–10.3)
Chloride: 109 mmol/L (ref 98–111)
Creatinine, Ser: 1.6 mg/dL — ABNORMAL HIGH (ref 0.61–1.24)
GFR calc non Af Amer: 40 mL/min — ABNORMAL LOW (ref >60)
GFR, EST AFRICAN AMERICAN: 46 mL/min — AB (ref >60)
Glucose, Bld: 117 mg/dL — ABNORMAL HIGH (ref 70–99)
Potassium: 4.4 mmol/L (ref 3.5–5.1)
Sodium: 140 mmol/L (ref 135–145)
TOTAL PROTEIN: 6.7 g/dL (ref 6.5–8.1)

## 2017-12-10 LAB — IRON AND TIBC
Iron: 62 ug/dL (ref 45–182)
Saturation Ratios: 23 % (ref 17.9–39.5)
TIBC: 274 ug/dL (ref 250–450)
UIBC: 212 ug/dL

## 2017-12-10 LAB — PSA: PROSTATIC SPECIFIC ANTIGEN: 11.32 ng/mL — AB (ref 0.00–4.00)

## 2017-12-10 LAB — FERRITIN: Ferritin: 307 ng/mL (ref 24–336)

## 2017-12-10 LAB — MAGNESIUM: Magnesium: 2.1 mg/dL (ref 1.7–2.4)

## 2017-12-10 MED ORDER — SODIUM CHLORIDE 0.9 % IV SOLN
75.0000 mg/m2 | Freq: Once | INTRAVENOUS | Status: AC
  Administered 2017-12-10: 140 mg via INTRAVENOUS
  Filled 2017-12-10: qty 14

## 2017-12-10 MED ORDER — ONDANSETRON 8 MG PO TBDP
ORAL_TABLET | ORAL | Status: AC
  Filled 2017-12-10: qty 1

## 2017-12-10 MED ORDER — HEPARIN SOD (PORK) LOCK FLUSH 100 UNIT/ML IV SOLN
500.0000 [IU] | Freq: Once | INTRAVENOUS | Status: AC | PRN
  Administered 2017-12-10: 500 [IU]

## 2017-12-10 MED ORDER — SODIUM CHLORIDE 0.9 % IV SOLN
Freq: Once | INTRAVENOUS | Status: AC
  Administered 2017-12-10: 11:00:00 via INTRAVENOUS

## 2017-12-10 MED ORDER — PEGFILGRASTIM 6 MG/0.6ML ~~LOC~~ PSKT: 6.0000 mg | PREFILLED_SYRINGE | Freq: Once | SUBCUTANEOUS | Status: DC

## 2017-12-10 MED ORDER — FAMOTIDINE IN NACL 20-0.9 MG/50ML-% IV SOLN
20.0000 mg | Freq: Two times a day (BID) | INTRAVENOUS | Status: DC
  Administered 2017-12-10: 20 mg via INTRAVENOUS

## 2017-12-10 MED ORDER — ONDANSETRON 8 MG PO TBDP
8.0000 mg | ORAL_TABLET | Freq: Once | ORAL | Status: AC
  Administered 2017-12-10: 8 mg via ORAL

## 2017-12-10 MED ORDER — FAMOTIDINE IN NACL 20-0.9 MG/50ML-% IV SOLN
INTRAVENOUS | Status: AC
  Filled 2017-12-10: qty 50

## 2017-12-10 MED ORDER — SODIUM CHLORIDE 0.9 % IV SOLN
510.0000 mg | Freq: Once | INTRAVENOUS | Status: AC
  Administered 2017-12-10: 510 mg via INTRAVENOUS
  Filled 2017-12-10: qty 17

## 2017-12-10 MED ORDER — DIPHENHYDRAMINE HCL 50 MG/ML IJ SOLN
INTRAMUSCULAR | Status: AC
  Filled 2017-12-10: qty 1

## 2017-12-10 MED ORDER — DEXAMETHASONE SODIUM PHOSPHATE 10 MG/ML IJ SOLN: 10.0000 mg | Freq: Once | INTRAMUSCULAR | Status: DC

## 2017-12-10 MED ORDER — DEXAMETHASONE SODIUM PHOSPHATE 10 MG/ML IJ SOLN
10.0000 mg | Freq: Once | INTRAMUSCULAR | Status: DC
  Administered 2017-12-10: 10 mg via INTRAVENOUS

## 2017-12-10 MED ORDER — DIPHENHYDRAMINE HCL 50 MG/ML IJ SOLN
25.0000 mg | Freq: Once | INTRAMUSCULAR | Status: AC
  Administered 2017-12-10: 25 mg via INTRAVENOUS

## 2017-12-10 MED ORDER — DEXAMETHASONE SODIUM PHOSPHATE 10 MG/ML IJ SOLN
INTRAMUSCULAR | Status: AC
  Filled 2017-12-10: qty 1

## 2017-12-10 MED ORDER — SODIUM CHLORIDE 0.9% FLUSH
10.0000 mL | INTRAVENOUS | Status: DC | PRN
  Administered 2017-12-10: 10 mL
  Filled 2017-12-10: qty 10

## 2017-12-10 NOTE — Assessment & Plan Note (Signed)
1.  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  However he reported back pain and chest wall pain for 3 months which improved after orchiectomy. - Bone scan on 09/12/2017 shows extensive abnormal uptake in the shoulders, sternum, ribs, throughout the spine, pelvis and proximal to mid femurs.  CT scan of the abdomen and pelvis without contrast on 09/12/2017 shows extensive retroperitoneal adenopathy,  right hydronephrosis. - Underwent prostate biopsy (Gleason 9), bilateral orchiectomy, cystoscopy with fulguration of bladder neck, attempted right ureteral stent placement on 09/17/2017. -Most recent PSA on 09/24/2017 has improved to 422. - As he has high-volume disease, I have recommended combining ADT (orchiectomy) with either docetaxel or Abiraterone as it significantly prolongs overall survival compared to ADT alone.  He wanted to go with docetaxel as it has short course of treatment with 6 cycles. - He received cycle 1 of docetaxel on 11/19/2017.  He tolerated it very well. - I have reviewed his blood work today.  He may proceed with cycle 2 without any dose modifications.  His creatinine has mildly improved to 1.6. -I have independently reviewed ultrasound of his kidneys dated 11/15/2017 which showed significant but stable right hydronephrosis compared to CT scan from May 2019.  We will hold off on any invasive procedures at this time including percutaneous nephrostomy. -I will make a referral to our geneticist for BRCA 1/2 and other relevant mutations to see if he is a candidate for PARP inhibitor therapy.  We will also consider sending his tumor tissue for foundation 1 CDX to check for somatic BRCA mutations.  2.  Bone metastasis: -He has extensive metastatic disease in the bone.  I have recommended monthly denosumab injections to decrease skeletal related events.  He is taking calcium and vitamin D supplements.  He  had his tooth pulled on 11/07/2017.  Hence I would hold off on starting him on Xgeva at this time.  We will start it along with cycle 3.  3.  Hypertension: -He is on ramipril 2.5 mg daily.  His creatinine went up to 1.7 today.  Potassium is 5.2.  I have told him to cut back on drinking fruit juices.

## 2017-12-10 NOTE — Patient Instructions (Signed)
[facility] at [facility] Discharge Instructions  Follow up in 3 weeks with labs   Thank you for choosing [facility] at [facility] to provide your oncology and hematology care.  To afford each patient quality time with our provider, please arrive at least 15 minutes before your scheduled appointment time.   If you have a lab appointment with the Cancer Center please come in thru the  Main Entrance and check in at the main information desk  You need to re-schedule your appointment should you arrive 10 or more minutes late.  We strive to give you quality time with our providers, and arriving late affects you and other patients whose appointments are after yours.  Also, if you no show three or more times for appointments you may be dismissed from the clinic at the providers discretion.     Again, thank you for choosing [facility].  Our hope is that these requests will decrease the amount of time that you wait before being seen by our physicians.       _____________________________________________________________  Should you have questions after your visit to [facility], please contact our office at [phone] between the hours of 8:00 a.m. and 4:30 p.m.  Voicemails left after 4:00 p.m. will not be returned until the following business day.  For prescription refill requests, have your pharmacy contact our office and allow 72 hours.    Cancer Center Support Programs:   > Cancer Support Group  2nd Tuesday of the month 1pm-2pm, Journey Room    

## 2017-12-10 NOTE — Progress Notes (Signed)
Patient seen by oncologist for follow up and labs.  Ok to treat today.    Verified with oncologist regarding Iron infusion and XGEVA.  Patient is to receive Iron today and Xgeva with next visit verbal order Dr. Delton Coombes.    Patient tolerated chemotherapy and iron infusion with no complaints voiced.  Good blood return noted before and after administration of therapies.  Port site clean and dry with no bruising or swelling noted at site.  Band aid applied.  VSS with discharge and left ambulatory with family.  No s/s of distress noted.  Patient instructed to stop by front desk for next appointment times.

## 2017-12-10 NOTE — Patient Instructions (Signed)
[place] Discharge Instructions for Patients Receiving Chemotherapy  Today you received the following chemotherapy agents taxotere and iron infusion today.    If you develop nausea and vomiting that is not controlled by your nausea medication, call the clinic.   BELOW ARE SYMPTOMS THAT SHOULD BE REPORTED IMMEDIATELY:  *FEVER GREATER THAN 100.5 F  *CHILLS WITH OR WITHOUT FEVER  NAUSEA AND VOMITING THAT IS NOT CONTROLLED WITH YOUR NAUSEA MEDICATION  *UNUSUAL SHORTNESS OF BREATH  *UNUSUAL BRUISING OR BLEEDING  TENDERNESS IN MOUTH AND THROAT WITH OR WITHOUT PRESENCE OF ULCERS  *URINARY PROBLEMS  *BOWEL PROBLEMS  UNUSUAL RASH Items with * indicate a potential emergency and should be followed up as soon as possible.  Feel free to call the clinic should you have any questions or concerns. The clinic phone number is [phone].  Please show the [place] at check-in to the Emergency Department and triage nurse.

## 2017-12-10 NOTE — Progress Notes (Signed)
Nutrition Follow-up  ASSESSMENT:  77 y/o male with little prior medical surveillance, presented to PCP mid may with complaints of back pain. Labs drawn showed PSA of >900. Full workup would reveal widely metastatic prostate cancer with several bone mets to spine, ribs, sternum, shoulders, femurs, pelvis. S/P bilateral orchiectomy, cystoscopy w/ fulguration of bladder neck and attempted R ureteral stent. Begun palliative chemotherapy 7/30  Patient seen today prior to his MD appt to assess weight/intake after first treatment.   He says he tolerated his 1st treatment well. He denies experiencing any n/v. He has periodic constipation/diarrhea, but "manages this well" with supportive medications. He denies any changes in appetite or taste. His only reported side effect was fatigue, but he says this did not impact his ability to eat at all. He still drinks Boost.   His mouth had been sore last month due to having numerous teeth pulled. He says this resolved.   He has absolutely no nutrition-related concerns.   His weight today is nearly identical to the weight from 3 weeks ago  Wt Readings from Last 10 Encounters:  12/10/17 160 lb 9.6 oz (72.8 kg)  11/19/17 160 lb 6.4 oz (72.8 kg)  10/08/17 159 lb (72.1 kg)  10/08/17 159 lb 8 oz (72.3 kg)  09/27/17 158 lb 6.4 oz (71.8 kg)  09/17/17 160 lb 4 oz (72.7 kg)  07/22/15 173 lb (78.5 kg)  06/10/15 170 lb (77.1 kg)  05/18/15 170 lb (77.1 kg)  05/13/15 170 lb (77.1 kg)   MEDICATIONS:  Chemotherapy: Docetaxel Supportive Medications: Hydrocodone, Compazine  LABS:  K: WDL Creat: 1.6. Improved from 1.75 (appears same as last month), Albumin:3.7, BG: 117  Recent Labs  Lab 12/10/17 1020  NA 140  K 4.4  CL 109  CO2 25  BUN 26*  CREATININE 1.60*  CALCIUM 8.7*  MG 2.1  GLUCOSE 117*   ANTHROPOMETRICS: Height:  Ht Readings from Last 1 Encounters:  10/08/17 5\' 7"  (1.702 m)   Weight:  Wt Readings from Last 1 Encounters:  12/10/17 160 lb 9.6  oz (72.8 kg)   BMI:  BMI Readings from Last 1 Encounters:  12/10/17 25.15 kg/m   UBW: 167-170 (per pt) IBW: 67.27 kg  ESTIMATED ENERGY NEEDS:  Kcal: 2200-2400 (30-33 kcal/kg bw)  Protein: 95-110g Pro (1.3-1.5 g/kg bw) Fluid:  1.8-2.2 L (25-30 ml/kg bw)  NUTRITION - FOCUSED PHYSICAL EXAM: Unable to perform (long pants, shirt w/ flannel)  NUTRITION DIAGNOSIS:  Increased protein/kcal needs related to cancer and cancer related treatments AEB by estimated nutritional requirements for this condition/ treatment  DOCUMENTATION CODES:  Not applicable at this time  INTERVENTION:  Patient was considered high risk due to large cancer burden. He appears to have largely been unphazed by his first treatment. He denies any changes in his eating pattern or his appetite. His weight is the same. He has no nutrition complaints.   Given his intake of Boost, RD reviewed Ensure Assistance Program. He asked to receive a case of Ensure today. RD provided this. He is eligible for two further cases   RD will only follow peripherally at this time. He knows RD is available if weight/intake decline.   GOAL:  Patient will meet greater than or equal to 90% of their needs  MONITOR:  PO intake, Labs, Weight trends, I & O's  Next Visit: As needed  Burtis Junes RD, LDN, CNSC Clinical Nutrition Available Tues-Sat via Pager: [phone] 12/10/2017 10:56 AM
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
## 2017-12-11 ENCOUNTER — Encounter (HOSPITAL_COMMUNITY): Payer: Self-pay | Admitting: *Deleted

## 2017-12-11 NOTE — Progress Notes (Signed)
I spoke with Carlos Harper in pathology to order Foundation one ordered on Accession # [id number] DX C61 Stage IV

## 2017-12-12 ENCOUNTER — Encounter (HOSPITAL_COMMUNITY): Payer: Self-pay

## 2017-12-12 ENCOUNTER — Ambulatory Visit (HOSPITAL_COMMUNITY): Payer: Medicaid Other

## 2017-12-12 ENCOUNTER — Inpatient Hospital Stay (HOSPITAL_COMMUNITY): Payer: Medicaid Other

## 2017-12-12 VITALS — BP 132/72 | HR 58 | Temp 97.6°F | Resp 18

## 2017-12-12 DIAGNOSIS — C61 Malignant neoplasm of prostate: Secondary | ICD-10-CM

## 2017-12-12 DIAGNOSIS — Z5111 Encounter for antineoplastic chemotherapy: Secondary | ICD-10-CM | POA: Diagnosis not present

## 2017-12-12 MED ORDER — PEGFILGRASTIM INJECTION 6 MG/0.6ML ~~LOC~~
6.0000 mg | PREFILLED_SYRINGE | Freq: Once | SUBCUTANEOUS | Status: AC
  Administered 2017-12-12: 6 mg via SUBCUTANEOUS
  Filled 2017-12-12: qty 0.6

## 2017-12-12 NOTE — Patient Instructions (Signed)
[place] at [facility]  Discharge Instructions:  You received a neulasta shot.  _______________________________________________________________  Thank you for choosing [place] at [facility] to provide your oncology and hematology care.  To afford each patient quality time with our providers, please arrive at least 15 minutes before your scheduled appointment.  You need to re-schedule your appointment if you arrive 10 or more minutes late.  We strive to give you quality time with our providers, and arriving late affects you and other patients whose appointments are after yours.  Also, if you no show three or more times for appointments you may be dismissed from the clinic.  Again, thank you for choosing [place] at [facility] hope is that these requests will allow you access to exceptional care and in a timely manner. _______________________________________________________________  If you have questions after your visit, please contact our office at [phone] between the hours of 8:30 a.m. and 5:00 p.m. Voicemails left after 4:30 p.m. will not be returned until the following business day. _______________________________________________________________  For prescription refill requests, have your pharmacy contact our office. _______________________________________________________________  Recommendations made by the consultant and any test results will be sent to your referring physician. _______________________________________________________________

## 2017-12-12 NOTE — Progress Notes (Signed)
Patient tolerate neulasta injection with no complaints voiced.  Injection site clean and dry with no bruising or swelling noted at site.  Band aid applied.  Reminded patient of side effects of medication and how to manage.  VSS with discharge and left ambulatory with no s/s of distress noted.

## 2017-12-18 ENCOUNTER — Encounter (HOSPITAL_COMMUNITY): Payer: Self-pay | Admitting: *Deleted

## 2017-12-18 NOTE — Progress Notes (Signed)
Patient's daughter called and stated that her dad had a nose bleed. She described it as a runny nose feeling. Just a small amount of blood coming out.   I advised her that this could be a side effect of his platelet count being low.  He is to hold pressure on his nose to try and get it to stop. Should he continue to have the nose bleed and not be able to get it to stop or if he should start to have excessive bleeding from the nose, patient is to report directly to the emergency room.  She verbalizes understanding.

## 2017-12-27 ENCOUNTER — Encounter (HOSPITAL_COMMUNITY): Payer: Self-pay | Admitting: Hematology

## 2017-12-30 ENCOUNTER — Inpatient Hospital Stay (HOSPITAL_COMMUNITY): Payer: Medicaid Other | Attending: Hematology

## 2017-12-30 DIAGNOSIS — I10 Essential (primary) hypertension: Secondary | ICD-10-CM | POA: Diagnosis not present

## 2017-12-30 DIAGNOSIS — Z5189 Encounter for other specified aftercare: Secondary | ICD-10-CM | POA: Insufficient documentation

## 2017-12-30 DIAGNOSIS — K5900 Constipation, unspecified: Secondary | ICD-10-CM | POA: Insufficient documentation

## 2017-12-30 DIAGNOSIS — N289 Disorder of kidney and ureter, unspecified: Secondary | ICD-10-CM | POA: Diagnosis not present

## 2017-12-30 DIAGNOSIS — C61 Malignant neoplasm of prostate: Secondary | ICD-10-CM | POA: Diagnosis present

## 2017-12-30 DIAGNOSIS — Z5111 Encounter for antineoplastic chemotherapy: Secondary | ICD-10-CM | POA: Diagnosis not present

## 2017-12-30 DIAGNOSIS — C7951 Secondary malignant neoplasm of bone: Secondary | ICD-10-CM | POA: Insufficient documentation

## 2017-12-30 LAB — CBC WITH DIFFERENTIAL/PLATELET
BASOS ABS: 0.1 10*3/uL (ref 0.0–0.1)
Basophils Relative: 2 %
Eosinophils Absolute: 0 10*3/uL (ref 0.0–0.7)
Eosinophils Relative: 0 %
HCT: 30.9 % — ABNORMAL LOW (ref 39.0–52.0)
Hemoglobin: 10 g/dL — ABNORMAL LOW (ref 13.0–17.0)
LYMPHS ABS: 1.8 10*3/uL (ref 0.7–4.0)
LYMPHS PCT: 34 %
MCH: 30.5 pg (ref 26.0–34.0)
MCHC: 32.4 g/dL (ref 30.0–36.0)
MCV: 94.2 fL (ref 78.0–100.0)
Monocytes Absolute: 0.6 10*3/uL (ref 0.1–1.0)
Monocytes Relative: 12 %
NEUTROS ABS: 2.8 10*3/uL (ref 1.7–7.7)
NEUTROS PCT: 52 %
PLATELETS: 171 10*3/uL (ref 150–400)
RBC: 3.28 MIL/uL — AB (ref 4.22–5.81)
RDW: 17.9 % — ABNORMAL HIGH (ref 11.5–15.5)
WBC: 5.3 10*3/uL (ref 4.0–10.5)

## 2017-12-30 LAB — COMPREHENSIVE METABOLIC PANEL
ALBUMIN: 3.8 g/dL (ref 3.5–5.0)
ALT: 51 U/L — AB (ref 0–44)
AST: 31 U/L (ref 15–41)
Alkaline Phosphatase: 158 U/L — ABNORMAL HIGH (ref 38–126)
Anion gap: 6 (ref 5–15)
BUN: 23 mg/dL (ref 8–23)
CHLORIDE: 110 mmol/L (ref 98–111)
CO2: 26 mmol/L (ref 22–32)
CREATININE: 1.58 mg/dL — AB (ref 0.61–1.24)
Calcium: 8.9 mg/dL (ref 8.9–10.3)
GFR calc Af Amer: 47 mL/min — ABNORMAL LOW (ref >60)
GFR calc non Af Amer: 41 mL/min — ABNORMAL LOW (ref >60)
GLUCOSE: 98 mg/dL (ref 70–99)
Potassium: 4.7 mmol/L (ref 3.5–5.1)
Sodium: 142 mmol/L (ref 135–145)
Total Bilirubin: 1 mg/dL (ref 0.3–1.2)
Total Protein: 6.9 g/dL (ref 6.5–8.1)

## 2017-12-30 LAB — PSA: Prostatic Specific Antigen: 8.44 ng/mL — ABNORMAL HIGH (ref 0.00–4.00)

## 2017-12-30 LAB — MAGNESIUM: Magnesium: 2.1 mg/dL (ref 1.7–2.4)

## 2017-12-31 ENCOUNTER — Inpatient Hospital Stay (HOSPITAL_BASED_OUTPATIENT_CLINIC_OR_DEPARTMENT_OTHER): Payer: Medicaid Other | Admitting: Internal Medicine

## 2017-12-31 ENCOUNTER — Inpatient Hospital Stay (HOSPITAL_COMMUNITY): Payer: Medicaid Other

## 2017-12-31 ENCOUNTER — Encounter (HOSPITAL_COMMUNITY): Payer: Self-pay | Admitting: Internal Medicine

## 2017-12-31 ENCOUNTER — Telehealth: Payer: Self-pay | Admitting: Genetic Counselor

## 2017-12-31 ENCOUNTER — Other Ambulatory Visit: Payer: Self-pay

## 2017-12-31 VITALS — BP 114/62 | HR 64 | Temp 97.9°F | Resp 16 | Wt 162.3 lb

## 2017-12-31 VITALS — BP 109/56 | HR 56 | Temp 98.6°F | Resp 18

## 2017-12-31 DIAGNOSIS — K5900 Constipation, unspecified: Secondary | ICD-10-CM

## 2017-12-31 DIAGNOSIS — I10 Essential (primary) hypertension: Secondary | ICD-10-CM

## 2017-12-31 DIAGNOSIS — C61 Malignant neoplasm of prostate: Secondary | ICD-10-CM

## 2017-12-31 DIAGNOSIS — Z5111 Encounter for antineoplastic chemotherapy: Secondary | ICD-10-CM | POA: Diagnosis not present

## 2017-12-31 DIAGNOSIS — C7951 Secondary malignant neoplasm of bone: Secondary | ICD-10-CM | POA: Diagnosis not present

## 2017-12-31 DIAGNOSIS — N289 Disorder of kidney and ureter, unspecified: Secondary | ICD-10-CM

## 2017-12-31 MED ORDER — FAMOTIDINE IN NACL 20-0.9 MG/50ML-% IV SOLN
20.0000 mg | Freq: Two times a day (BID) | INTRAVENOUS | Status: DC
  Administered 2017-12-31: 20 mg via INTRAVENOUS
  Filled 2017-12-31: qty 50

## 2017-12-31 MED ORDER — DENOSUMAB 120 MG/1.7ML ~~LOC~~ SOLN
120.0000 mg | Freq: Once | SUBCUTANEOUS | Status: AC
  Administered 2017-12-31: 120 mg via SUBCUTANEOUS
  Filled 2017-12-31: qty 1.7

## 2017-12-31 MED ORDER — SODIUM CHLORIDE 0.9 % IV SOLN
Freq: Once | INTRAVENOUS | Status: AC
  Administered 2017-12-31: 11:00:00 via INTRAVENOUS
  Filled 2017-12-31: qty 4

## 2017-12-31 MED ORDER — DIPHENHYDRAMINE HCL 50 MG/ML IJ SOLN
25.0000 mg | Freq: Once | INTRAMUSCULAR | Status: AC
  Administered 2017-12-31: 25 mg via INTRAVENOUS
  Filled 2017-12-31: qty 1

## 2017-12-31 MED ORDER — SODIUM CHLORIDE 0.9 % IV SOLN
75.0000 mg/m2 | Freq: Once | INTRAVENOUS | Status: AC
  Administered 2017-12-31: 140 mg via INTRAVENOUS
  Filled 2017-12-31: qty 14

## 2017-12-31 MED ORDER — SODIUM CHLORIDE 0.9 % IV SOLN
Freq: Once | INTRAVENOUS | Status: AC
  Administered 2017-12-31: 11:00:00 via INTRAVENOUS

## 2017-12-31 MED ORDER — SODIUM CHLORIDE 0.9% FLUSH
10.0000 mL | INTRAVENOUS | Status: DC | PRN
  Administered 2017-12-31: 10 mL
  Filled 2017-12-31: qty 10

## 2017-12-31 MED ORDER — HEPARIN SOD (PORK) LOCK FLUSH 100 UNIT/ML IV SOLN
500.0000 [IU] | Freq: Once | INTRAVENOUS | Status: AC | PRN
  Administered 2017-12-31: 500 [IU]
  Filled 2017-12-31: qty 5

## 2017-12-31 NOTE — Patient Instructions (Signed)
[facility] Discharge Instructions for Patients Receiving Chemotherapy   Beginning January 23rd 2017 lab work for the [facility] will be done in the  Main lab at [facility] on 1st floor. If you have a lab appointment with the [facility] please come in thru the  Main Entrance and check in at the main information desk   Today you received the following chemotherapy agents Taxotere infusion and Xgeva injection. Follow-up as scheduled. Call clinic for any questions or concerns  To help prevent nausea and vomiting after your treatment, we encourage you to take your nausea medication   If you develop nausea and vomiting, or diarrhea that is not controlled by your medication, call the clinic.  The clinic phone number is [phone]. Office hours are Monday-Friday 8:30am-5:00pm.  BELOW ARE SYMPTOMS THAT SHOULD BE REPORTED IMMEDIATELY:  *FEVER GREATER THAN 101.0 F  *CHILLS WITH OR WITHOUT FEVER  NAUSEA AND VOMITING THAT IS NOT CONTROLLED WITH YOUR NAUSEA MEDICATION  *UNUSUAL SHORTNESS OF BREATH  *UNUSUAL BRUISING OR BLEEDING  TENDERNESS IN MOUTH AND THROAT WITH OR WITHOUT PRESENCE OF ULCERS  *URINARY PROBLEMS  *BOWEL PROBLEMS  UNUSUAL RASH Items with * indicate a potential emergency and should be followed up as soon as possible. If you have an emergency after office hours please contact your primary care physician or go to the nearest emergency department.  Please call the clinic during office hours if you have any questions or concerns.   You may also contact the Patient Navigator at [phone] should you have any questions or need assistance in obtaining follow up care.      Resources For Cancer Patients and their Caregivers ? American Cancer Society: Can assist with transportation, wigs, general needs, runs Look Good Feel Better.        (986) 842-8702 ? Cancer Care: Provides financial assistance, online support groups, medication/co-pay  assistance.  1-800-813-HOPE 551 001 3965) ? [place] Assists [place] cancer patients and their families through emotional , educational and financial support.  417 605 9944 ? [place] DSS Where to apply for food stamps, Medicaid and utility assistance. (831)634-7944 ? [facility]: Transportation to medical appointments. (845) 838-8648 ? Social Security Administration: May apply for disability if have a Stage IV cancer. (203)408-9346 516-576-8836 ? [employer], Disability and Transit Services: Assists with nutrition, care and transit needs. (506)[phone]

## 2017-12-31 NOTE — Telephone Encounter (Signed)
error 

## 2017-12-31 NOTE — Progress Notes (Signed)
1045 Labs reviewed with and pt seen by Dr. Walden Field and pt approved for Taxotere infusion and Xgeva injection today per MD Calcium 8.9 and pt denies any tooth or jaw pain and no recent or future dental visits. Pt continues to take Calcium PO as prescribed without issues.                            Carlos Harper tolerated Taxotere infusion and Xgeva injection well without complaints or incident. VSS upon discharge. Pt discharged self ambulatory in satisfactory condition accompanied by family member
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
## 2018-01-02 ENCOUNTER — Encounter (HOSPITAL_COMMUNITY): Payer: Self-pay

## 2018-01-02 ENCOUNTER — Other Ambulatory Visit (HOSPITAL_COMMUNITY): Payer: Medicaid Other

## 2018-01-02 ENCOUNTER — Encounter (HOSPITAL_COMMUNITY): Payer: Medicaid Other | Admitting: Genetic Counselor

## 2018-01-02 ENCOUNTER — Ambulatory Visit (HOSPITAL_COMMUNITY): Payer: Medicaid Other

## 2018-01-02 ENCOUNTER — Inpatient Hospital Stay (HOSPITAL_COMMUNITY): Payer: Medicaid Other

## 2018-01-02 ENCOUNTER — Other Ambulatory Visit: Payer: Self-pay

## 2018-01-02 VITALS — BP 113/72 | HR 69 | Temp 97.9°F | Resp 16

## 2018-01-02 DIAGNOSIS — Z5111 Encounter for antineoplastic chemotherapy: Secondary | ICD-10-CM | POA: Diagnosis not present

## 2018-01-02 DIAGNOSIS — C61 Malignant neoplasm of prostate: Secondary | ICD-10-CM

## 2018-01-02 MED ORDER — PEGFILGRASTIM INJECTION 6 MG/0.6ML ~~LOC~~
6.0000 mg | PREFILLED_SYRINGE | Freq: Once | SUBCUTANEOUS | Status: AC
  Administered 2018-01-02: 6 mg via SUBCUTANEOUS
  Filled 2018-01-02: qty 0.6

## 2018-01-02 NOTE — Progress Notes (Signed)
Pt here today for Neulasta injection. Pt given injection in his left arm. Pt tolerated injection well with no complaint. Pt stable and discharged home ambulatory. Pt to return as scheduled for treatments and injections.

## 2018-01-21 ENCOUNTER — Inpatient Hospital Stay (HOSPITAL_COMMUNITY): Payer: Medicaid Other | Attending: Hematology

## 2018-01-21 DIAGNOSIS — C7951 Secondary malignant neoplasm of bone: Secondary | ICD-10-CM | POA: Insufficient documentation

## 2018-01-21 DIAGNOSIS — C61 Malignant neoplasm of prostate: Secondary | ICD-10-CM | POA: Diagnosis present

## 2018-01-21 DIAGNOSIS — Z5189 Encounter for other specified aftercare: Secondary | ICD-10-CM | POA: Diagnosis not present

## 2018-01-21 DIAGNOSIS — N189 Chronic kidney disease, unspecified: Secondary | ICD-10-CM | POA: Diagnosis not present

## 2018-01-21 DIAGNOSIS — D631 Anemia in chronic kidney disease: Secondary | ICD-10-CM | POA: Diagnosis not present

## 2018-01-21 DIAGNOSIS — Z5111 Encounter for antineoplastic chemotherapy: Secondary | ICD-10-CM | POA: Insufficient documentation

## 2018-01-21 DIAGNOSIS — D509 Iron deficiency anemia, unspecified: Secondary | ICD-10-CM | POA: Diagnosis not present

## 2018-01-21 DIAGNOSIS — I129 Hypertensive chronic kidney disease with stage 1 through stage 4 chronic kidney disease, or unspecified chronic kidney disease: Secondary | ICD-10-CM | POA: Insufficient documentation

## 2018-01-21 LAB — COMPREHENSIVE METABOLIC PANEL
ALBUMIN: 3.9 g/dL (ref 3.5–5.0)
ALT: 22 U/L (ref 0–44)
AST: 20 U/L (ref 15–41)
Alkaline Phosphatase: 127 U/L — ABNORMAL HIGH (ref 38–126)
Anion gap: 9 (ref 5–15)
BUN: 30 mg/dL — AB (ref 8–23)
CHLORIDE: 110 mmol/L (ref 98–111)
CO2: 20 mmol/L — AB (ref 22–32)
CREATININE: 1.61 mg/dL — AB (ref 0.61–1.24)
Calcium: 8.4 mg/dL — ABNORMAL LOW (ref 8.9–10.3)
GFR calc Af Amer: 46 mL/min — ABNORMAL LOW (ref >60)
GFR calc non Af Amer: 40 mL/min — ABNORMAL LOW (ref >60)
Glucose, Bld: 96 mg/dL (ref 70–99)
POTASSIUM: 4.8 mmol/L (ref 3.5–5.1)
Sodium: 139 mmol/L (ref 135–145)
Total Bilirubin: 0.9 mg/dL (ref 0.3–1.2)
Total Protein: 6.9 g/dL (ref 6.5–8.1)

## 2018-01-21 LAB — CBC WITH DIFFERENTIAL/PLATELET
Basophils Absolute: 0.1 10*3/uL (ref 0.0–0.1)
Basophils Relative: 1 %
EOS PCT: 0 %
Eosinophils Absolute: 0 10*3/uL (ref 0.0–0.7)
HEMATOCRIT: 29.9 % — AB (ref 39.0–52.0)
Hemoglobin: 9.8 g/dL — ABNORMAL LOW (ref 13.0–17.0)
LYMPHS PCT: 30 %
Lymphs Abs: 1.5 10*3/uL (ref 0.7–4.0)
MCH: 31.2 pg (ref 26.0–34.0)
MCHC: 32.8 g/dL (ref 30.0–36.0)
MCV: 95.2 fL (ref 78.0–100.0)
MONO ABS: 0.6 10*3/uL (ref 0.1–1.0)
MONOS PCT: 12 %
NEUTROS ABS: 2.8 10*3/uL (ref 1.7–7.7)
Neutrophils Relative %: 57 %
PLATELETS: 207 10*3/uL (ref 150–400)
RBC: 3.14 MIL/uL — ABNORMAL LOW (ref 4.22–5.81)
RDW: 17.5 % — AB (ref 11.5–15.5)
WBC: 5 10*3/uL (ref 4.0–10.5)

## 2018-01-22 ENCOUNTER — Encounter (HOSPITAL_COMMUNITY): Payer: Self-pay | Admitting: Hematology

## 2018-01-22 ENCOUNTER — Inpatient Hospital Stay (HOSPITAL_COMMUNITY): Payer: Medicaid Other | Attending: Hematology | Admitting: Hematology

## 2018-01-22 ENCOUNTER — Other Ambulatory Visit: Payer: Self-pay

## 2018-01-22 ENCOUNTER — Inpatient Hospital Stay (HOSPITAL_COMMUNITY): Payer: Medicaid Other

## 2018-01-22 VITALS — BP 110/56 | HR 63 | Temp 97.9°F | Resp 16 | Wt 165.0 lb

## 2018-01-22 VITALS — BP 111/64 | HR 54 | Temp 97.9°F | Resp 18

## 2018-01-22 DIAGNOSIS — D509 Iron deficiency anemia, unspecified: Secondary | ICD-10-CM | POA: Diagnosis not present

## 2018-01-22 DIAGNOSIS — C7951 Secondary malignant neoplasm of bone: Secondary | ICD-10-CM | POA: Diagnosis not present

## 2018-01-22 DIAGNOSIS — C61 Malignant neoplasm of prostate: Secondary | ICD-10-CM

## 2018-01-22 DIAGNOSIS — I129 Hypertensive chronic kidney disease with stage 1 through stage 4 chronic kidney disease, or unspecified chronic kidney disease: Secondary | ICD-10-CM | POA: Insufficient documentation

## 2018-01-22 DIAGNOSIS — Z5111 Encounter for antineoplastic chemotherapy: Secondary | ICD-10-CM | POA: Diagnosis not present

## 2018-01-22 MED ORDER — HEPARIN SOD (PORK) LOCK FLUSH 100 UNIT/ML IV SOLN
INTRAVENOUS | Status: AC
  Filled 2018-01-22: qty 5

## 2018-01-22 MED ORDER — HEPARIN SOD (PORK) LOCK FLUSH 100 UNIT/ML IV SOLN
500.0000 [IU] | Freq: Once | INTRAVENOUS | Status: AC | PRN
  Administered 2018-01-22: 500 [IU]

## 2018-01-22 MED ORDER — SODIUM CHLORIDE 0.9 % IV SOLN
75.0000 mg/m2 | Freq: Once | INTRAVENOUS | Status: AC
  Administered 2018-01-22: 140 mg via INTRAVENOUS
  Filled 2018-01-22: qty 14

## 2018-01-22 MED ORDER — SODIUM CHLORIDE 0.9 % IV SOLN
510.0000 mg | Freq: Once | INTRAVENOUS | Status: AC
  Administered 2018-01-22: 510 mg via INTRAVENOUS
  Filled 2018-01-22: qty 17

## 2018-01-22 MED ORDER — SODIUM CHLORIDE 0.9 % IV SOLN
Freq: Once | INTRAVENOUS | Status: AC
  Administered 2018-01-22: 10:00:00 via INTRAVENOUS

## 2018-01-22 MED ORDER — DIPHENHYDRAMINE HCL 50 MG/ML IJ SOLN
25.0000 mg | Freq: Once | INTRAMUSCULAR | Status: AC
  Administered 2018-01-22: 25 mg via INTRAVENOUS
  Filled 2018-01-22: qty 1

## 2018-01-22 MED ORDER — FAMOTIDINE IN NACL 20-0.9 MG/50ML-% IV SOLN
20.0000 mg | Freq: Two times a day (BID) | INTRAVENOUS | Status: DC
  Administered 2018-01-22: 20 mg via INTRAVENOUS
  Filled 2018-01-22: qty 50

## 2018-01-22 MED ORDER — SODIUM CHLORIDE 0.9% FLUSH
10.0000 mL | INTRAVENOUS | Status: DC | PRN
  Administered 2018-01-22: 10 mL
  Filled 2018-01-22: qty 10

## 2018-01-22 MED ORDER — SODIUM CHLORIDE 0.9 % IV SOLN
Freq: Once | INTRAVENOUS | Status: AC
  Administered 2018-01-22: 10:00:00 via INTRAVENOUS
  Filled 2018-01-22: qty 4

## 2018-01-22 NOTE — Patient Instructions (Signed)
[facility] Discharge Instructions for Patients Receiving Chemotherapy   Beginning January 23rd 2017 lab work for the [facility] will be done in the  Main lab at [facility] on 1st floor. If you have a lab appointment with the [facility] please come in thru the  Main Entrance and check in at the main information desk   Today you received the following chemotherapy agents Taxotere as well as Feraheme infusion. Follow-up as scheduled. Call clinic for any questions or concerns  To help prevent nausea and vomiting after your treatment, we encourage you to take your nausea medication   If you develop nausea and vomiting, or diarrhea that is not controlled by your medication, call the clinic.  The clinic phone number is [phone]. Office hours are Monday-Friday 8:30am-5:00pm.  BELOW ARE SYMPTOMS THAT SHOULD BE REPORTED IMMEDIATELY:  *FEVER GREATER THAN 101.0 F  *CHILLS WITH OR WITHOUT FEVER  NAUSEA AND VOMITING THAT IS NOT CONTROLLED WITH YOUR NAUSEA MEDICATION  *UNUSUAL SHORTNESS OF BREATH  *UNUSUAL BRUISING OR BLEEDING  TENDERNESS IN MOUTH AND THROAT WITH OR WITHOUT PRESENCE OF ULCERS  *URINARY PROBLEMS  *BOWEL PROBLEMS  UNUSUAL RASH Items with * indicate a potential emergency and should be followed up as soon as possible. If you have an emergency after office hours please contact your primary care physician or go to the nearest emergency department.  Please call the clinic during office hours if you have any questions or concerns.   You may also contact the Patient Navigator at [phone] should you have any questions or need assistance in obtaining follow up care.      Resources For Cancer Patients and their Caregivers ? American Cancer Society: Can assist with transportation, wigs, general needs, runs Look Good Feel Better.        ([phone] ? Cancer Care: Provides financial assistance, online support groups, medication/co-pay  assistance.  1-800-813-HOPE 458-803-7374) ? [facility] Assists [place] cancer patients and their families through emotional , educational and financial support.  617-371-2233 ? [place] DSS Where to apply for food stamps, Medicaid and utility assistance. (559) 573-9633 ? [facility]: Transportation to medical appointments. 514 882 9008 ? Social Security Administration: May apply for disability if have a Stage IV cancer. [phone] 425 9114 ? [employer], Disability and Transit Services: Assists with nutrition, care and transit needs. [phone]

## 2018-01-22 NOTE — Assessment & Plan Note (Addendum)
1.  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  However he reported back pain and chest wall pain for 3 months which improved after orchiectomy. - Bone scan on 09/12/2017 shows extensive abnormal uptake in the shoulders, sternum, ribs, throughout the spine, pelvis and proximal to mid femurs.  CT scan of the abdomen and pelvis without contrast on 09/12/2017 shows extensive retroperitoneal adenopathy,  right hydronephrosis. - Underwent prostate biopsy (Gleason 9), bilateral orchiectomy, cystoscopy with fulguration of bladder neck, attempted right ureteral stent placement on 09/17/2017. -Most recent PSA on 09/24/2017 has improved to 422. - As he has high-volume disease, I have recommended combining ADT (orchiectomy) with either docetaxel or Abiraterone as it significantly prolongs overall survival compared to ADT alone.  He wanted to go with docetaxel as it has short course of treatment with 6 cycles. - He received cycle 1 of docetaxel on 11/19/2017.  He tolerated it very well. -His last cycle was cycle 3 on 12/31/2017.  He did not experience any major side effects.  He has developed hyperpigmentation inside the port area without any pain or swelling. -Today his blood counts are adequate to proceed with cycle 4 without any dose modifications.  His PSA has come down to 8.4. -I will make a referral to our geneticist for BRCA 1/2 and other relevant mutations to see if he is a candidate for PARP inhibitor therapy.  We will also consider sending his tumor tissue for foundation 1 CDX to check for somatic BRCA mutations.  2.  Bone metastasis: -He was started on denosumab for his bony metastatic disease to decrease skeletal related events.  He is taking calcium and vitamin D supplements.  He had tooth pulled on 11/07/2017.  He needs a dental fillings done.  I have advised him to wait until he finishes chemotherapy.  We would  certainly hold Xgeva during the dental work.    3.  Hypertension: -He takes ramipril 2.5 mg daily.  His blood pressure is well controlled.  Creatinine is stable at 1.6.   4.  Normocytic anemia: -I have recommended parenteral iron therapy with Feraheme.  We discussed about side effects in detail.  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

## 2018-01-22 NOTE — Progress Notes (Signed)
0950 Labs reviewed with and pt seen by Dr. Delton Coombes today and pt approved for Taxotere infusion as well as Ferahmeme infusion. MD also aware of skin discoloration noted on chest area mid chest to left side under portacath. Pt denies any tenderness or discomfort to this area and OK to use portacath per MD                                           Carlos Harper tolerated Taxotere and Feraheme infusions well without complaints or incident. VSS upon discharge. Pt discharged self ambulatory in satisfactory condition
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
## 2018-01-22 NOTE — Patient Instructions (Signed)
[place] at [facility] Discharge Instructions  Follow up in 3 weeks with labs prior,   Thank you for choosing [place] at [facility] to provide your oncology and hematology care.  To afford each patient quality time with our provider, please arrive at least 15 minutes before your scheduled appointment time.   If you have a lab appointment with the [facility] please come in thru the  Main Entrance and check in at the main information desk  You need to re-schedule your appointment should you arrive 10 or more minutes late.  We strive to give you quality time with our providers, and arriving late affects you and other patients whose appointments are after yours.  Also, if you no show three or more times for appointments you may be dismissed from the clinic at the providers discretion.     Again, thank you for choosing [facility].  Our hope is that these requests will decrease the amount of time that you wait before being seen by our physicians.       _____________________________________________________________  Should you have questions after your visit to [facility], please contact our office at [phone] between the hours of 8:00 a.m. and 4:30 p.m.  Voicemails left after 4:00 p.m. will not be returned until the following business day.  For prescription refill requests, have your pharmacy contact our office and allow 72 hours.    Cancer Center Support Programs:   > Cancer Support Group  2nd Tuesday of the month 1pm-2pm, Journey Room

## 2018-01-24 ENCOUNTER — Inpatient Hospital Stay (HOSPITAL_COMMUNITY): Payer: Medicaid Other

## 2018-01-24 ENCOUNTER — Other Ambulatory Visit: Payer: Self-pay

## 2018-01-24 ENCOUNTER — Encounter (HOSPITAL_COMMUNITY): Payer: Self-pay

## 2018-01-24 VITALS — BP 123/67 | HR 68 | Temp 98.6°F | Resp 16

## 2018-01-24 DIAGNOSIS — C61 Malignant neoplasm of prostate: Secondary | ICD-10-CM

## 2018-01-24 DIAGNOSIS — Z5111 Encounter for antineoplastic chemotherapy: Secondary | ICD-10-CM | POA: Diagnosis not present

## 2018-01-24 MED ORDER — PEGFILGRASTIM INJECTION 6 MG/0.6ML ~~LOC~~
PREFILLED_SYRINGE | SUBCUTANEOUS | Status: AC
  Filled 2018-01-24: qty 0.6

## 2018-01-24 MED ORDER — PEGFILGRASTIM INJECTION 6 MG/0.6ML ~~LOC~~
6.0000 mg | PREFILLED_SYRINGE | Freq: Once | SUBCUTANEOUS | Status: AC
  Administered 2018-01-24: 6 mg via SUBCUTANEOUS

## 2018-01-24 NOTE — Progress Notes (Signed)
Neulasta injection given today per MD.   Patient tolerated it well without problems. Vitals stable and discharged home from clinic ambulatory. Follow up as scheduled.

## 2018-01-24 NOTE — Patient Instructions (Signed)
[facility] at [facility] Discharge Instructions     Thank you for choosing [facility] at [facility] to provide your oncology and hematology care.  To afford each patient quality time with our provider, please arrive at least 15 minutes before your scheduled appointment time.   If you have a lab appointment with the Cancer Center please come in thru the  Main Entrance and check in at the main information desk  You need to re-schedule your appointment should you arrive 10 or more minutes late.  We strive to give you quality time with our providers, and arriving late affects you and other patients whose appointments are after yours.  Also, if you no show three or more times for appointments you may be dismissed from the clinic at the providers discretion.     Again, thank you for choosing [facility].  Our hope is that these requests will decrease the amount of time that you wait before being seen by our physicians.       _____________________________________________________________  Should you have questions after your visit to [facility], please contact our office at [phone] between the hours of 8:00 a.m. and 4:30 p.m.  Voicemails left after 4:00 p.m. will not be returned until the following business day.  For prescription refill requests, have your pharmacy contact our office and allow 72 hours.    Cancer Center Support Programs:   > Cancer Support Group  2nd Tuesday of the month 1pm-2pm, Journey Room    

## 2018-01-29 ENCOUNTER — Ambulatory Visit (HOSPITAL_COMMUNITY): Payer: Medicaid Other

## 2018-01-29 ENCOUNTER — Other Ambulatory Visit (HOSPITAL_COMMUNITY): Payer: Medicaid Other

## 2018-02-11 ENCOUNTER — Other Ambulatory Visit (HOSPITAL_COMMUNITY): Payer: Medicaid Other

## 2018-02-12 ENCOUNTER — Other Ambulatory Visit (HOSPITAL_COMMUNITY): Payer: Self-pay | Admitting: Internal Medicine

## 2018-02-12 ENCOUNTER — Inpatient Hospital Stay (HOSPITAL_COMMUNITY): Payer: Medicaid Other

## 2018-02-12 ENCOUNTER — Other Ambulatory Visit: Payer: Self-pay

## 2018-02-12 ENCOUNTER — Encounter (HOSPITAL_COMMUNITY): Payer: Self-pay | Admitting: Internal Medicine

## 2018-02-12 ENCOUNTER — Inpatient Hospital Stay (HOSPITAL_BASED_OUTPATIENT_CLINIC_OR_DEPARTMENT_OTHER): Payer: Medicaid Other | Admitting: Internal Medicine

## 2018-02-12 VITALS — BP 108/63 | HR 61 | Temp 97.4°F | Resp 16

## 2018-02-12 VITALS — BP 120/56 | HR 62 | Temp 97.9°F | Resp 16 | Wt 162.4 lb

## 2018-02-12 DIAGNOSIS — C7951 Secondary malignant neoplasm of bone: Secondary | ICD-10-CM | POA: Diagnosis not present

## 2018-02-12 DIAGNOSIS — Z5111 Encounter for antineoplastic chemotherapy: Secondary | ICD-10-CM | POA: Diagnosis not present

## 2018-02-12 DIAGNOSIS — N189 Chronic kidney disease, unspecified: Secondary | ICD-10-CM

## 2018-02-12 DIAGNOSIS — C61 Malignant neoplasm of prostate: Secondary | ICD-10-CM

## 2018-02-12 DIAGNOSIS — I129 Hypertensive chronic kidney disease with stage 1 through stage 4 chronic kidney disease, or unspecified chronic kidney disease: Secondary | ICD-10-CM

## 2018-02-12 DIAGNOSIS — D631 Anemia in chronic kidney disease: Secondary | ICD-10-CM

## 2018-02-12 LAB — CBC WITH DIFFERENTIAL/PLATELET
Abs Immature Granulocytes: 0.01 10*3/uL (ref 0.00–0.07)
BASOS ABS: 0.1 10*3/uL (ref 0.0–0.1)
BASOS PCT: 1 %
EOS ABS: 0 10*3/uL (ref 0.0–0.5)
Eosinophils Relative: 1 %
HCT: 33.9 % — ABNORMAL LOW (ref 39.0–52.0)
Hemoglobin: 10.4 g/dL — ABNORMAL LOW (ref 13.0–17.0)
IMMATURE GRANULOCYTES: 0 %
Lymphocytes Relative: 24 %
Lymphs Abs: 1 10*3/uL (ref 0.7–4.0)
MCH: 30.7 pg (ref 26.0–34.0)
MCHC: 30.7 g/dL (ref 30.0–36.0)
MCV: 100 fL (ref 80.0–100.0)
Monocytes Absolute: 0.6 10*3/uL (ref 0.1–1.0)
Monocytes Relative: 13 %
NEUTROS ABS: 2.7 10*3/uL (ref 1.7–7.7)
NEUTROS PCT: 61 %
PLATELETS: 207 10*3/uL (ref 150–400)
RBC: 3.39 MIL/uL — ABNORMAL LOW (ref 4.22–5.81)
RDW: 16.2 % — AB (ref 11.5–15.5)
WBC: 4.4 10*3/uL (ref 4.0–10.5)
nRBC: 0.5 % — ABNORMAL HIGH (ref 0.0–0.2)

## 2018-02-12 LAB — COMPREHENSIVE METABOLIC PANEL
ALBUMIN: 3.9 g/dL (ref 3.5–5.0)
ALT: 17 U/L (ref 0–44)
AST: 19 U/L (ref 15–41)
Alkaline Phosphatase: 104 U/L (ref 38–126)
Anion gap: 5 (ref 5–15)
BUN: 23 mg/dL (ref 8–23)
CHLORIDE: 111 mmol/L (ref 98–111)
CO2: 23 mmol/L (ref 22–32)
CREATININE: 1.59 mg/dL — AB (ref 0.61–1.24)
Calcium: 8.1 mg/dL — ABNORMAL LOW (ref 8.9–10.3)
GFR calc Af Amer: 46 mL/min — ABNORMAL LOW (ref >60)
GFR, EST NON AFRICAN AMERICAN: 40 mL/min — AB (ref >60)
Glucose, Bld: 98 mg/dL (ref 70–99)
POTASSIUM: 4.9 mmol/L (ref 3.5–5.1)
SODIUM: 139 mmol/L (ref 135–145)
Total Bilirubin: 0.7 mg/dL (ref 0.3–1.2)
Total Protein: 7.1 g/dL (ref 6.5–8.1)

## 2018-02-12 LAB — PSA: PROSTATIC SPECIFIC ANTIGEN: 5.71 ng/mL — AB (ref 0.00–4.00)

## 2018-02-12 MED ORDER — DIPHENHYDRAMINE HCL 50 MG/ML IJ SOLN
INTRAMUSCULAR | Status: AC
  Filled 2018-02-12: qty 1

## 2018-02-12 MED ORDER — PROCHLORPERAZINE MALEATE 10 MG PO TABS: 10.0000 mg | ORAL_TABLET | Freq: Four times a day (QID) | ORAL | 1 refills | Status: DC | PRN

## 2018-02-12 MED ORDER — SODIUM CHLORIDE 0.9 % IV SOLN
75.0000 mg/m2 | Freq: Once | INTRAVENOUS | Status: AC
  Administered 2018-02-12: 140 mg via INTRAVENOUS
  Filled 2018-02-12: qty 14

## 2018-02-12 MED ORDER — LIDOCAINE-PRILOCAINE 2.5-2.5 % EX CREA: TOPICAL_CREAM | CUTANEOUS | 3 refills | Status: DC

## 2018-02-12 MED ORDER — FAMOTIDINE IN NACL 20-0.9 MG/50ML-% IV SOLN
INTRAVENOUS | Status: AC
  Filled 2018-02-12: qty 50

## 2018-02-12 MED ORDER — FAMOTIDINE IN NACL 20-0.9 MG/50ML-% IV SOLN
20.0000 mg | Freq: Two times a day (BID) | INTRAVENOUS | Status: DC
  Administered 2018-02-12: 20 mg via INTRAVENOUS

## 2018-02-12 MED ORDER — SODIUM CHLORIDE 0.9 % IV SOLN
Freq: Once | INTRAVENOUS | Status: AC
  Administered 2018-02-12: 10:00:00 via INTRAVENOUS

## 2018-02-12 MED ORDER — DIPHENHYDRAMINE HCL 50 MG/ML IJ SOLN
25.0000 mg | Freq: Once | INTRAMUSCULAR | Status: AC
  Administered 2018-02-12: 25 mg via INTRAVENOUS

## 2018-02-12 MED ORDER — SODIUM CHLORIDE 0.9% FLUSH: 10.0000 mL | INTRAVENOUS | Status: DC | PRN

## 2018-02-12 MED ORDER — SODIUM CHLORIDE 0.9 % IV SOLN
Freq: Once | INTRAVENOUS | Status: AC
  Administered 2018-02-12: 11:00:00 via INTRAVENOUS
  Filled 2018-02-12: qty 4

## 2018-02-12 MED ORDER — HEPARIN SOD (PORK) LOCK FLUSH 100 UNIT/ML IV SOLN
500.0000 [IU] | Freq: Once | INTRAVENOUS | Status: AC | PRN
  Administered 2018-02-12: 500 [IU]
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
## 2018-02-12 NOTE — Progress Notes (Signed)
Holding xgeva today per patient's daughter. Will discuss with Dr. Delton Coombes on when to restart this.

## 2018-02-12 NOTE — Progress Notes (Signed)
Seen by Dr. Walden Field today. Labs reviewed. Proceed with treatment per MD. No complaints today. VSS. 0 pain score.

## 2018-02-12 NOTE — Progress Notes (Signed)
Pt saw Dr. Walden Field today. Labs reviewed by MD. Proceed with treatment. Pt has no complaints at this time. VSS.   Treatment given today per MD orders. Tolerated infusion without adverse affects. Vital signs stable. No complaints at this time. Discharged from clinic ambulatory. F/U with [facility] as scheduled.

## 2018-02-12 NOTE — Patient Instructions (Signed)
[facility] Discharge Instructions for Patients Receiving Chemotherapy  Today you received the following chemotherapy agents   To help prevent nausea and vomiting after your treatment, we encourage you to take your nausea medication   If you develop nausea and vomiting that is not controlled by your nausea medication, call the clinic.   BELOW ARE SYMPTOMS THAT SHOULD BE REPORTED IMMEDIATELY:  *FEVER GREATER THAN 100.5 F  *CHILLS WITH OR WITHOUT FEVER  NAUSEA AND VOMITING THAT IS NOT CONTROLLED WITH YOUR NAUSEA MEDICATION  *UNUSUAL SHORTNESS OF BREATH  *UNUSUAL BRUISING OR BLEEDING  TENDERNESS IN MOUTH AND THROAT WITH OR WITHOUT PRESENCE OF ULCERS  *URINARY PROBLEMS  *BOWEL PROBLEMS  UNUSUAL RASH Items with * indicate a potential emergency and should be followed up as soon as possible.  Feel free to call the clinic should you have any questions or concerns. The clinic phone number is [phone].  Please show the CHEMO ALERT CARD at check-in to the Emergency Department and triage nurse.   

## 2018-02-14 ENCOUNTER — Encounter (HOSPITAL_COMMUNITY): Payer: Self-pay

## 2018-02-14 ENCOUNTER — Inpatient Hospital Stay (HOSPITAL_COMMUNITY): Payer: Medicaid Other

## 2018-02-14 VITALS — BP 116/61 | HR 74 | Temp 97.7°F | Resp 18 | Wt 160.6 lb

## 2018-02-14 DIAGNOSIS — Z5111 Encounter for antineoplastic chemotherapy: Secondary | ICD-10-CM | POA: Diagnosis not present

## 2018-02-14 DIAGNOSIS — C61 Malignant neoplasm of prostate: Secondary | ICD-10-CM

## 2018-02-14 MED ORDER — PEGFILGRASTIM INJECTION 6 MG/0.6ML ~~LOC~~
6.0000 mg | PREFILLED_SYRINGE | Freq: Once | SUBCUTANEOUS | Status: AC
  Administered 2018-02-14: 6 mg via SUBCUTANEOUS
  Filled 2018-02-14: qty 0.6

## 2018-02-14 NOTE — Patient Instructions (Signed)
[facility] at [facility]  Discharge Instructions:   _______________________________________________________________  Thank you for choosing [facility] at [facility] to provide your oncology and hematology care.  To afford each patient quality time with our providers, please arrive at least 15 minutes before your scheduled appointment.  You need to re-schedule your appointment if you arrive 10 or more minutes late.  We strive to give you quality time with our providers, and arriving late affects you and other patients whose appointments are after yours.  Also, if you no show three or more times for appointments you may be dismissed from the clinic.  Again, thank you for choosing [facility] at [facility]. Our hope is that these requests will allow you access to exceptional care and in a timely manner. _______________________________________________________________  If you have questions after your visit, please contact our office at [phone] between the hours of 8:30 a.m. and 5:00 p.m. Voicemails left after 4:30 p.m. will not be returned until the following business day. _______________________________________________________________  For prescription refill requests, have your pharmacy contact our office. _______________________________________________________________  Recommendations made by the consultant and any test results will be sent to your referring physician. _______________________________________________________________ 

## 2018-02-14 NOTE — Progress Notes (Signed)
Patient tolerated injection with no complaints voiced.  Site clean and dry with band aid applied.  VSs with discharge and left ambulatory with no s/s of distress noted.  

## 2018-03-05 ENCOUNTER — Encounter (HOSPITAL_COMMUNITY): Payer: Self-pay | Admitting: Hematology

## 2018-03-05 ENCOUNTER — Inpatient Hospital Stay (HOSPITAL_BASED_OUTPATIENT_CLINIC_OR_DEPARTMENT_OTHER): Payer: Medicaid Other | Admitting: Hematology

## 2018-03-05 ENCOUNTER — Inpatient Hospital Stay (HOSPITAL_COMMUNITY): Payer: Medicaid Other | Attending: Hematology

## 2018-03-05 ENCOUNTER — Inpatient Hospital Stay (HOSPITAL_COMMUNITY): Payer: Medicaid Other

## 2018-03-05 VITALS — BP 130/73 | HR 57 | Temp 98.3°F | Resp 18 | Wt 166.0 lb

## 2018-03-05 DIAGNOSIS — N189 Chronic kidney disease, unspecified: Secondary | ICD-10-CM | POA: Diagnosis not present

## 2018-03-05 DIAGNOSIS — I129 Hypertensive chronic kidney disease with stage 1 through stage 4 chronic kidney disease, or unspecified chronic kidney disease: Secondary | ICD-10-CM | POA: Insufficient documentation

## 2018-03-05 DIAGNOSIS — C7951 Secondary malignant neoplasm of bone: Secondary | ICD-10-CM | POA: Insufficient documentation

## 2018-03-05 DIAGNOSIS — D509 Iron deficiency anemia, unspecified: Secondary | ICD-10-CM

## 2018-03-05 DIAGNOSIS — Z5111 Encounter for antineoplastic chemotherapy: Secondary | ICD-10-CM | POA: Insufficient documentation

## 2018-03-05 DIAGNOSIS — C61 Malignant neoplasm of prostate: Secondary | ICD-10-CM

## 2018-03-05 DIAGNOSIS — D631 Anemia in chronic kidney disease: Secondary | ICD-10-CM | POA: Diagnosis not present

## 2018-03-05 DIAGNOSIS — Z5189 Encounter for other specified aftercare: Secondary | ICD-10-CM | POA: Diagnosis not present

## 2018-03-05 LAB — CBC WITH DIFFERENTIAL/PLATELET
Abs Immature Granulocytes: 0.02 10*3/uL (ref 0.00–0.07)
BASOS ABS: 0 10*3/uL (ref 0.0–0.1)
Basophils Relative: 1 %
Eosinophils Absolute: 0 10*3/uL (ref 0.0–0.5)
Eosinophils Relative: 1 %
HCT: 31.6 % — ABNORMAL LOW (ref 39.0–52.0)
HEMOGLOBIN: 9.9 g/dL — AB (ref 13.0–17.0)
IMMATURE GRANULOCYTES: 1 %
LYMPHS PCT: 34 %
Lymphs Abs: 1.2 10*3/uL (ref 0.7–4.0)
MCH: 31.7 pg (ref 26.0–34.0)
MCHC: 31.3 g/dL (ref 30.0–36.0)
MCV: 101.3 fL — ABNORMAL HIGH (ref 80.0–100.0)
Monocytes Absolute: 0.6 10*3/uL (ref 0.1–1.0)
Monocytes Relative: 18 %
NRBC: 1.1 % — AB (ref 0.0–0.2)
Neutro Abs: 1.6 10*3/uL — ABNORMAL LOW (ref 1.7–7.7)
Neutrophils Relative %: 45 %
Platelets: 194 10*3/uL (ref 150–400)
RBC: 3.12 MIL/uL — AB (ref 4.22–5.81)
RDW: 15.7 % — ABNORMAL HIGH (ref 11.5–15.5)
WBC: 3.5 10*3/uL — AB (ref 4.0–10.5)

## 2018-03-05 LAB — COMPREHENSIVE METABOLIC PANEL
ALBUMIN: 3.7 g/dL (ref 3.5–5.0)
ALK PHOS: 81 U/L (ref 38–126)
ALT: 19 U/L (ref 0–44)
ANION GAP: 5 (ref 5–15)
AST: 19 U/L (ref 15–41)
BUN: 26 mg/dL — ABNORMAL HIGH (ref 8–23)
CALCIUM: 8.4 mg/dL — AB (ref 8.9–10.3)
CO2: 24 mmol/L (ref 22–32)
CREATININE: 1.56 mg/dL — AB (ref 0.61–1.24)
Chloride: 110 mmol/L (ref 98–111)
GFR calc Af Amer: 47 mL/min — ABNORMAL LOW (ref >60)
GFR calc non Af Amer: 41 mL/min — ABNORMAL LOW (ref >60)
GLUCOSE: 87 mg/dL (ref 70–99)
Potassium: 4.7 mmol/L (ref 3.5–5.1)
SODIUM: 139 mmol/L (ref 135–145)
Total Bilirubin: 0.7 mg/dL (ref 0.3–1.2)
Total Protein: 6.5 g/dL (ref 6.5–8.1)

## 2018-03-05 LAB — FERRITIN: Ferritin: 618 ng/mL — ABNORMAL HIGH (ref 24–336)

## 2018-03-05 LAB — LACTATE DEHYDROGENASE: LDH: 193 U/L — ABNORMAL HIGH (ref 98–192)

## 2018-03-05 MED ORDER — SODIUM CHLORIDE 0.9% FLUSH
10.0000 mL | INTRAVENOUS | Status: DC | PRN
  Administered 2018-03-05: 10 mL
  Filled 2018-03-05: qty 10

## 2018-03-05 MED ORDER — SODIUM CHLORIDE 0.9 % IV SOLN
75.0000 mg/m2 | Freq: Once | INTRAVENOUS | Status: AC
  Administered 2018-03-05: 140 mg via INTRAVENOUS
  Filled 2018-03-05: qty 14

## 2018-03-05 MED ORDER — HEPARIN SOD (PORK) LOCK FLUSH 100 UNIT/ML IV SOLN
500.0000 [IU] | Freq: Once | INTRAVENOUS | Status: AC | PRN
  Administered 2018-03-05: 500 [IU]

## 2018-03-05 MED ORDER — SODIUM CHLORIDE 0.9 % IV SOLN
Freq: Once | INTRAVENOUS | Status: AC
  Administered 2018-03-05: 12:00:00 via INTRAVENOUS
  Filled 2018-03-05: qty 4

## 2018-03-05 MED ORDER — SODIUM CHLORIDE 0.9 % IV SOLN
Freq: Once | INTRAVENOUS | Status: AC
  Administered 2018-03-05: 11:00:00 via INTRAVENOUS

## 2018-03-05 MED ORDER — DIPHENHYDRAMINE HCL 50 MG/ML IJ SOLN
25.0000 mg | Freq: Once | INTRAMUSCULAR | Status: AC
  Administered 2018-03-05: 25 mg via INTRAVENOUS

## 2018-03-05 MED ORDER — DIPHENHYDRAMINE HCL 50 MG/ML IJ SOLN
INTRAMUSCULAR | Status: AC
  Filled 2018-03-05: qty 1

## 2018-03-05 MED ORDER — FAMOTIDINE IN NACL 20-0.9 MG/50ML-% IV SOLN
INTRAVENOUS | Status: AC
  Filled 2018-03-05: qty 50

## 2018-03-05 MED ORDER — FAMOTIDINE IN NACL 20-0.9 MG/50ML-% IV SOLN
20.0000 mg | Freq: Two times a day (BID) | INTRAVENOUS | Status: DC
  Administered 2018-03-05: 20 mg via INTRAVENOUS

## 2018-03-05 NOTE — Patient Instructions (Signed)
[facility] Discharge Instructions for Patients Receiving Chemotherapy  Today you received the following chemotherapy agents docetaxol   If you develop nausea and vomiting that is not controlled by your nausea medication, call the clinic.   BELOW ARE SYMPTOMS THAT SHOULD BE REPORTED IMMEDIATELY:  *FEVER GREATER THAN 100.5 F  *CHILLS WITH OR WITHOUT FEVER  NAUSEA AND VOMITING THAT IS NOT CONTROLLED WITH YOUR NAUSEA MEDICATION  *UNUSUAL SHORTNESS OF BREATH  *UNUSUAL BRUISING OR BLEEDING  TENDERNESS IN MOUTH AND THROAT WITH OR WITHOUT PRESENCE OF ULCERS  *URINARY PROBLEMS  *BOWEL PROBLEMS  UNUSUAL RASH Items with * indicate a potential emergency and should be followed up as soon as possible.  Feel free to call the clinic should you have any questions or concerns. The clinic phone number is [phone].  Please show the [place] at check-in to the Emergency Department and triage nurse.

## 2018-03-05 NOTE — Progress Notes (Signed)
Patient seen for oncology follow up visit with lab review and ok to be treated today.  Patient tolerated chemotherapy with no complaints voiced.  Port site clean and dry with no bruising or swelling noted at site.  Good blood return noted before and after treatment.  Band aid applied.  VSS with discharge and left ambulatory with no s/s of distress noted.

## 2018-03-07 ENCOUNTER — Encounter (HOSPITAL_COMMUNITY): Payer: Self-pay | Admitting: Hematology

## 2018-03-07 ENCOUNTER — Encounter (HOSPITAL_COMMUNITY): Payer: Self-pay

## 2018-03-07 ENCOUNTER — Inpatient Hospital Stay (HOSPITAL_COMMUNITY): Payer: Medicaid Other

## 2018-03-07 VITALS — BP 115/61 | HR 72 | Temp 98.0°F | Resp 18

## 2018-03-07 DIAGNOSIS — C61 Malignant neoplasm of prostate: Secondary | ICD-10-CM

## 2018-03-07 DIAGNOSIS — Z5111 Encounter for antineoplastic chemotherapy: Secondary | ICD-10-CM | POA: Diagnosis not present

## 2018-03-07 MED ORDER — PEGFILGRASTIM INJECTION 6 MG/0.6ML ~~LOC~~
6.0000 mg | PREFILLED_SYRINGE | Freq: Once | SUBCUTANEOUS | Status: AC
  Administered 2018-03-07: 6 mg via SUBCUTANEOUS
  Filled 2018-03-07: qty 0.6

## 2018-03-07 NOTE — Patient Instructions (Signed)
[facility] at [facility] Discharge Instructions  Received Neulasta injection today. Follow-up as scheduled. Call clinic for any questions or concerns   Thank you for choosing [facility] at [facility] to provide your oncology and hematology care.  To afford each patient quality time with our provider, please arrive at least 15 minutes before your scheduled appointment time.   If you have a lab appointment with the Cancer Center please come in thru the  Main Entrance and check in at the main information desk  You need to re-schedule your appointment should you arrive 10 or more minutes late.  We strive to give you quality time with our providers, and arriving late affects you and other patients whose appointments are after yours.  Also, if you no show three or more times for appointments you may be dismissed from the clinic at the providers discretion.     Again, thank you for choosing [facility].  Our hope is that these requests will decrease the amount of time that you wait before being seen by our physicians.       _____________________________________________________________  Should you have questions after your visit to [facility], please contact our office at [phone] between the hours of 8:00 a.m. and 4:30 p.m.  Voicemails left after 4:00 p.m. will not be returned until the following business day.  For prescription refill requests, have your pharmacy contact our office and allow 72 hours.    Cancer Center Support Programs:   > Cancer Support Group  2nd Tuesday of the month 1pm-2pm, Journey Room   

## 2018-03-07 NOTE — Assessment & Plan Note (Signed)
1.  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  However he reported back pain and chest wall pain for 3 months which improved after orchiectomy. - Bone scan on 09/12/2017 shows extensive abnormal uptake in the shoulders, sternum, ribs, throughout the spine, pelvis and proximal to mid femurs.  CT scan of the abdomen and pelvis without contrast on 09/12/2017 shows extensive retroperitoneal adenopathy,  right hydronephrosis. - Underwent prostate biopsy (Gleason 9), bilateral orchiectomy, cystoscopy with fulguration of bladder neck, attempted right ureteral stent placement on 09/17/2017. -Most recent PSA on 09/24/2017 has improved to 422. - As he has high-volume disease, I have recommended combining ADT (orchiectomy) with either docetaxel or Abiraterone as it significantly prolongs overall survival compared to ADT alone.  He wanted to go with docetaxel as it has short course of treatment with 6 cycles. - He received cycle 1 of docetaxel on 11/19/2017.  He tolerated it very well. -He completed cycle 5 chemotherapy on 02/12/2018.  He is continuing to tolerate it very well. - We have reviewed his blood counts.  He may proceed with cycle 6 without any dose modifications. -I will see him back in 4 weeks for follow-up.  I plan to repeat CT scan and bone scan at that time.  We may consider to put him on either enzalutamide or Abiraterone prednisone.  Exline-his last PSA level has come down to 5.7 on 02/12/2018. - He does have constipation for which she is taking Colace 1 tablet daily.  I have told him to increase it to 2 tablets daily.  2.  Bone metastasis: -He was started on denosumab for his bony metastatic disease to decrease skeletal related events.  He is taking calcium and vitamin D supplements.  He had tooth pulled on 11/07/2017.  He needs a dental fillings done.  I have advised him to wait until he finishes chemotherapy.   We would certainly hold Xgeva during the dental work.    3.  Hypertension: -He takes ramipril 2.5 mg daily.  His blood pressure is well controlled.  Creatinine is stable at 1.6.   4.  Normocytic anemia: -I have recommended parenteral iron therapy with Feraheme.  We discussed about side effects in detail.  

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
## 2018-03-07 NOTE — Progress Notes (Signed)
Carlos Harper tolerated Neulasta injection well without complaints or incident. VSS Pt discharged self ambulatory in satisfactory condition

## 2018-03-14 ENCOUNTER — Other Ambulatory Visit (HOSPITAL_COMMUNITY)
Admission: RE | Admit: 2018-03-14 | Discharge: 2018-03-14 | Disposition: A | Discharge status: Home / Self Care | Payer: Medicaid Other | Source: Ambulatory Visit | Attending: Urology | Admitting: Urology

## 2018-03-14 ENCOUNTER — Ambulatory Visit (INDEPENDENT_AMBULATORY_CARE_PROVIDER_SITE_OTHER): Payer: Medicaid Other | Admitting: Urology

## 2018-03-14 DIAGNOSIS — C61 Malignant neoplasm of prostate: Secondary | ICD-10-CM | POA: Diagnosis not present

## 2018-03-14 DIAGNOSIS — N130 Hydronephrosis with ureteropelvic junction obstruction: Secondary | ICD-10-CM | POA: Diagnosis not present

## 2018-03-14 DIAGNOSIS — C778 Secondary and unspecified malignant neoplasm of lymph nodes of multiple regions: Secondary | ICD-10-CM | POA: Diagnosis not present

## 2018-03-14 DIAGNOSIS — C7951 Secondary malignant neoplasm of bone: Secondary | ICD-10-CM

## 2018-03-14 DIAGNOSIS — R8271 Bacteriuria: Secondary | ICD-10-CM | POA: Diagnosis not present

## 2018-03-16 LAB — URINE CULTURE: Culture: >=100000 — AB

## 2018-03-25 ENCOUNTER — Ambulatory Visit (HOSPITAL_COMMUNITY)
Admission: RE | Admit: 2018-03-25 | Discharge: 2018-03-25 | Disposition: A | Discharge status: Home / Self Care | Payer: Medicaid Other | Source: Ambulatory Visit | Attending: Hematology | Admitting: Hematology

## 2018-03-25 DIAGNOSIS — C61 Malignant neoplasm of prostate: Secondary | ICD-10-CM | POA: Diagnosis not present

## 2018-03-25 MED ORDER — IOPAMIDOL (ISOVUE-300) INJECTION 61%
100.0000 mL | Freq: Once | INTRAVENOUS | Status: AC | PRN
  Administered 2018-03-25: 80 mL via INTRAVENOUS

## 2018-04-03 ENCOUNTER — Other Ambulatory Visit (HOSPITAL_COMMUNITY): Payer: Medicaid Other

## 2018-04-03 ENCOUNTER — Ambulatory Visit (HOSPITAL_COMMUNITY): Payer: Medicaid Other | Admitting: Hematology

## 2018-04-10 ENCOUNTER — Inpatient Hospital Stay (HOSPITAL_BASED_OUTPATIENT_CLINIC_OR_DEPARTMENT_OTHER): Payer: Medicaid Other | Admitting: Hematology

## 2018-04-10 ENCOUNTER — Encounter (HOSPITAL_COMMUNITY): Payer: Self-pay | Admitting: Hematology

## 2018-04-10 ENCOUNTER — Other Ambulatory Visit: Payer: Self-pay

## 2018-04-10 ENCOUNTER — Inpatient Hospital Stay (HOSPITAL_COMMUNITY): Payer: Medicaid Other | Attending: Hematology

## 2018-04-10 VITALS — BP 141/77 | HR 55 | Temp 98.7°F | Resp 18 | Wt 167.6 lb

## 2018-04-10 DIAGNOSIS — I10 Essential (primary) hypertension: Secondary | ICD-10-CM

## 2018-04-10 DIAGNOSIS — Z803 Family history of malignant neoplasm of breast: Secondary | ICD-10-CM | POA: Diagnosis not present

## 2018-04-10 DIAGNOSIS — Z9221 Personal history of antineoplastic chemotherapy: Secondary | ICD-10-CM | POA: Insufficient documentation

## 2018-04-10 DIAGNOSIS — D573 Sickle-cell trait: Secondary | ICD-10-CM

## 2018-04-10 DIAGNOSIS — C61 Malignant neoplasm of prostate: Secondary | ICD-10-CM | POA: Insufficient documentation

## 2018-04-10 DIAGNOSIS — R202 Paresthesia of skin: Secondary | ICD-10-CM

## 2018-04-10 DIAGNOSIS — Z79899 Other long term (current) drug therapy: Secondary | ICD-10-CM | POA: Diagnosis not present

## 2018-04-10 DIAGNOSIS — C7951 Secondary malignant neoplasm of bone: Secondary | ICD-10-CM

## 2018-04-10 DIAGNOSIS — Z801 Family history of malignant neoplasm of trachea, bronchus and lung: Secondary | ICD-10-CM | POA: Insufficient documentation

## 2018-04-10 LAB — CBC WITH DIFFERENTIAL/PLATELET
Abs Immature Granulocytes: 0 10*3/uL (ref 0.00–0.07)
Basophils Absolute: 0.1 10*3/uL (ref 0.0–0.1)
Basophils Relative: 2 %
Eosinophils Absolute: 0 10*3/uL (ref 0.0–0.5)
Eosinophils Relative: 1 %
HEMATOCRIT: 34.8 % — AB (ref 39.0–52.0)
Hemoglobin: 11 g/dL — ABNORMAL LOW (ref 13.0–17.0)
Immature Granulocytes: 0 %
Lymphocytes Relative: 45 %
Lymphs Abs: 1.2 10*3/uL (ref 0.7–4.0)
MCH: 30.9 pg (ref 26.0–34.0)
MCHC: 31.6 g/dL (ref 30.0–36.0)
MCV: 97.8 fL (ref 80.0–100.0)
Monocytes Absolute: 0.3 10*3/uL (ref 0.1–1.0)
Monocytes Relative: 11 %
Neutro Abs: 1.1 10*3/uL — ABNORMAL LOW (ref 1.7–7.7)
Neutrophils Relative %: 41 %
Platelets: 224 10*3/uL (ref 150–400)
RBC: 3.56 MIL/uL — ABNORMAL LOW (ref 4.22–5.81)
RDW: 13.7 % (ref 11.5–15.5)
WBC: 2.7 10*3/uL — ABNORMAL LOW (ref 4.0–10.5)
nRBC: 0 % (ref 0.0–0.2)

## 2018-04-10 LAB — COMPREHENSIVE METABOLIC PANEL
ALT: 18 U/L (ref 0–44)
AST: 24 U/L (ref 15–41)
Albumin: 4 g/dL (ref 3.5–5.0)
Alkaline Phosphatase: 67 U/L (ref 38–126)
Anion gap: 5 (ref 5–15)
BUN: 16 mg/dL (ref 8–23)
CO2: 23 mmol/L (ref 22–32)
Calcium: 8.5 mg/dL — ABNORMAL LOW (ref 8.9–10.3)
Chloride: 109 mmol/L (ref 98–111)
Creatinine, Ser: 1.51 mg/dL — ABNORMAL HIGH (ref 0.61–1.24)
GFR calc non Af Amer: 44 mL/min — ABNORMAL LOW (ref >60)
GFR, EST AFRICAN AMERICAN: 51 mL/min — AB (ref >60)
Glucose, Bld: 81 mg/dL (ref 70–99)
Potassium: 5.1 mmol/L (ref 3.5–5.1)
Sodium: 137 mmol/L (ref 135–145)
TOTAL PROTEIN: 6.7 g/dL (ref 6.5–8.1)
Total Bilirubin: 0.9 mg/dL (ref 0.3–1.2)

## 2018-04-10 LAB — PSA: Prostatic Specific Antigen: 4.52 ng/mL — ABNORMAL HIGH (ref 0.00–4.00)

## 2018-04-10 LAB — MAGNESIUM: Magnesium: 2 mg/dL (ref 1.7–2.4)

## 2018-04-10 NOTE — Patient Instructions (Signed)
[facility] at [facility] Discharge Instructions  Follow up 2 weeks after starting your new medication.   Thank you for choosing [facility] at [facility] to provide your oncology and hematology care.  To afford each patient quality time with our provider, please arrive at least 15 minutes before your scheduled appointment time.   If you have a lab appointment with the [facility] please come in thru the  Main Entrance and check in at the main information desk  You need to re-schedule your appointment should you arrive 10 or more minutes late.  We strive to give you quality time with our providers, and arriving late affects you and other patients whose appointments are after yours.  Also, if you no show three or more times for appointments you may be dismissed from the clinic at the providers discretion.     Again, thank you for choosing [facility].  Our hope is that these requests will decrease the amount of time that you wait before being seen by our physicians.       _____________________________________________________________  Should you have questions after your visit to [facility], please contact our office at [phone] between the hours of 8:00 a.m. and 4:30 p.m.  Voicemails left after 4:00 p.m. will not be returned until the following business day.  For prescription refill requests, have your pharmacy contact our office and allow 72 hours.    Cancer Center Support Programs:   > Cancer Support Group  2nd Tuesday of the month 1pm-2pm, Journey Room
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
## 2018-04-10 NOTE — Assessment & Plan Note (Signed)
1.  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  However he reported back pain and chest wall pain for 3 months which improved after orchiectomy. - Bone scan on 09/12/2017 shows extensive abnormal uptake in the shoulders, sternum, ribs, throughout the spine, pelvis and proximal to mid femurs.  CT scan of the abdomen and pelvis without contrast on 09/12/2017 shows extensive retroperitoneal adenopathy,  right hydronephrosis. - Underwent prostate biopsy (Gleason 9), bilateral orchiectomy, cystoscopy with fulguration of bladder neck, attempted right ureteral stent placement on 09/17/2017. -Most recent PSA on 09/24/2017 has improved to 422. - As he has high-volume disease, I have recommended combining ADT (orchiectomy) with either docetaxel or Abiraterone as it significantly prolongs overall survival compared to ADT alone.  He wanted to go with docetaxel as it has short course of treatment with 6 cycles. - 6 cycles of docetaxel chemotherapy from 11/19/2017 through 03/05/2018. -We discussed the results of the CT of the abdomen and pelvis dated 03/25/2018 which showed interval reduction in size of pelvic lymph nodes with lymph nodes which are now less than 10 mm short axis.  No evidence of new adenopathy in the abdomen or pelvis.  Interval marked improvement in right hydronephrosis/hydroureter.  Mild persistent hydronephrosis/hydroureter.  Measurable volume reduction in prostate gland. - Somehow his bone scan was missed.  We will make sure it is done.  PSA from today is pending.  Last PSA on 02/12/2018 was 5.71. -We have talked to him about initiating him on Abiraterone thousand milligrams daily along with prednisone 5 mg daily.  We discussed about side effects in detail including but not limited to hypertension, hypokalemia, fluid retention.  We will send the prescription.  He will see [place] back in 2 weeks after starting  Abiraterone. -He has mild on and off tingling in the hands.  We will continue to watch it closely.  2.  Bone metastasis: -He was started on denosumab, last dose was on 12/31/2017. - We held his denosumab secondary to dental work.  He had filling done last week.  We will continue to hold it until he finishes dental work.  3.  Hypertension: -He takes ramipril 2.5 mg daily.  His blood pressure is well controlled.  Creatinine is stable at 1.5.  4.  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  Last Feraheme was on 01/22/2018.

## 2018-04-29 ENCOUNTER — Ambulatory Visit (HOSPITAL_COMMUNITY)
Admission: RE | Admit: 2018-04-29 | Discharge: 2018-04-29 | Disposition: A | Discharge status: Home / Self Care | Payer: Medicaid Other | Source: Ambulatory Visit | Attending: Hematology | Admitting: Hematology

## 2018-04-29 ENCOUNTER — Encounter (HOSPITAL_COMMUNITY): Payer: Self-pay

## 2018-04-29 ENCOUNTER — Inpatient Hospital Stay (HOSPITAL_COMMUNITY): Payer: Medicaid Other | Attending: Hematology

## 2018-04-29 DIAGNOSIS — C61 Malignant neoplasm of prostate: Secondary | ICD-10-CM | POA: Diagnosis present

## 2018-04-29 DIAGNOSIS — C7951 Secondary malignant neoplasm of bone: Secondary | ICD-10-CM | POA: Insufficient documentation

## 2018-04-29 DIAGNOSIS — N189 Chronic kidney disease, unspecified: Secondary | ICD-10-CM | POA: Insufficient documentation

## 2018-04-29 DIAGNOSIS — I129 Hypertensive chronic kidney disease with stage 1 through stage 4 chronic kidney disease, or unspecified chronic kidney disease: Secondary | ICD-10-CM | POA: Insufficient documentation

## 2018-04-29 DIAGNOSIS — D631 Anemia in chronic kidney disease: Secondary | ICD-10-CM | POA: Diagnosis not present

## 2018-04-29 DIAGNOSIS — K5900 Constipation, unspecified: Secondary | ICD-10-CM | POA: Diagnosis not present

## 2018-04-29 DIAGNOSIS — D509 Iron deficiency anemia, unspecified: Secondary | ICD-10-CM | POA: Insufficient documentation

## 2018-04-29 LAB — MAGNESIUM: Magnesium: 2.1 mg/dL (ref 1.7–2.4)

## 2018-04-29 LAB — CBC WITH DIFFERENTIAL/PLATELET
Abs Immature Granulocytes: 0 10*3/uL (ref 0.00–0.07)
Basophils Absolute: 0 10*3/uL (ref 0.0–0.1)
Basophils Relative: 1 %
EOS PCT: 1 %
Eosinophils Absolute: 0 10*3/uL (ref 0.0–0.5)
HCT: 36 % — ABNORMAL LOW (ref 39.0–52.0)
Hemoglobin: 11.3 g/dL — ABNORMAL LOW (ref 13.0–17.0)
Immature Granulocytes: 0 %
Lymphocytes Relative: 44 %
Lymphs Abs: 1.2 10*3/uL (ref 0.7–4.0)
MCH: 29.8 pg (ref 26.0–34.0)
MCHC: 31.4 g/dL (ref 30.0–36.0)
MCV: 95 fL (ref 80.0–100.0)
Monocytes Absolute: 0.2 10*3/uL (ref 0.1–1.0)
Monocytes Relative: 8 %
Neutro Abs: 1.3 10*3/uL — ABNORMAL LOW (ref 1.7–7.7)
Neutrophils Relative %: 46 %
Platelets: 205 10*3/uL (ref 150–400)
RBC: 3.79 MIL/uL — ABNORMAL LOW (ref 4.22–5.81)
RDW: 13.1 % (ref 11.5–15.5)
WBC: 2.8 10*3/uL — ABNORMAL LOW (ref 4.0–10.5)
nRBC: 0 % (ref 0.0–0.2)

## 2018-04-29 LAB — COMPREHENSIVE METABOLIC PANEL
ALT: 19 U/L (ref 0–44)
ANION GAP: 6 (ref 5–15)
AST: 19 U/L (ref 15–41)
Albumin: 4 g/dL (ref 3.5–5.0)
Alkaline Phosphatase: 75 U/L (ref 38–126)
BUN: 17 mg/dL (ref 8–23)
CO2: 23 mmol/L (ref 22–32)
Calcium: 8.5 mg/dL — ABNORMAL LOW (ref 8.9–10.3)
Chloride: 109 mmol/L (ref 98–111)
Creatinine, Ser: 1.48 mg/dL — ABNORMAL HIGH (ref 0.61–1.24)
GFR calc Af Amer: 52 mL/min — ABNORMAL LOW (ref >60)
GFR calc non Af Amer: 45 mL/min — ABNORMAL LOW (ref >60)
Glucose, Bld: 95 mg/dL (ref 70–99)
Potassium: 4.6 mmol/L (ref 3.5–5.1)
Sodium: 138 mmol/L (ref 135–145)
Total Bilirubin: 0.9 mg/dL (ref 0.3–1.2)
Total Protein: 7 g/dL (ref 6.5–8.1)

## 2018-04-29 MED ORDER — TECHNETIUM TC 99M MEDRONATE IV KIT
20.0000 | PACK | Freq: Once | INTRAVENOUS | Status: AC | PRN
  Administered 2018-04-29: 20.5 via INTRAVENOUS

## 2018-05-02 ENCOUNTER — Encounter (HOSPITAL_COMMUNITY): Payer: Self-pay | Admitting: Hematology

## 2018-05-02 ENCOUNTER — Other Ambulatory Visit: Payer: Self-pay

## 2018-05-02 ENCOUNTER — Telehealth (HOSPITAL_COMMUNITY): Payer: Self-pay | Admitting: Pharmacist

## 2018-05-02 ENCOUNTER — Inpatient Hospital Stay (HOSPITAL_BASED_OUTPATIENT_CLINIC_OR_DEPARTMENT_OTHER): Payer: Medicaid Other | Admitting: Hematology

## 2018-05-02 VITALS — BP 156/84 | HR 64 | Temp 97.8°F | Resp 16 | Wt 170.8 lb

## 2018-05-02 DIAGNOSIS — C7951 Secondary malignant neoplasm of bone: Secondary | ICD-10-CM | POA: Diagnosis not present

## 2018-05-02 DIAGNOSIS — N189 Chronic kidney disease, unspecified: Secondary | ICD-10-CM | POA: Diagnosis not present

## 2018-05-02 DIAGNOSIS — D631 Anemia in chronic kidney disease: Secondary | ICD-10-CM | POA: Diagnosis not present

## 2018-05-02 DIAGNOSIS — D509 Iron deficiency anemia, unspecified: Secondary | ICD-10-CM

## 2018-05-02 DIAGNOSIS — I129 Hypertensive chronic kidney disease with stage 1 through stage 4 chronic kidney disease, or unspecified chronic kidney disease: Secondary | ICD-10-CM

## 2018-05-02 DIAGNOSIS — C61 Malignant neoplasm of prostate: Secondary | ICD-10-CM

## 2018-05-02 MED ORDER — ABIRATERONE ACETATE 250 MG PO TABS: 1000.0000 mg | ORAL_TABLET | Freq: Every day | ORAL | 0 refills | Status: DC

## 2018-05-02 MED ORDER — PREDNISONE 5 MG PO TABS: 5.0000 mg | ORAL_TABLET | Freq: Every day | ORAL | 3 refills | Status: DC

## 2018-05-02 NOTE — Patient Instructions (Addendum)
[place] at [facility] Discharge Instructions   Follow up in 3 weeks with labs.  Please take calcium and vitamin D daily.  Thank you for choosing [place] at [facility] to provide your oncology and hematology care.  To afford each patient quality time with our provider, please arrive at least 15 minutes before your scheduled appointment time.   If you have a lab appointment with the [facility] please come in thru the  Main Entrance and check in at the main information desk  You need to re-schedule your appointment should you arrive 10 or more minutes late.  We strive to give you quality time with our providers, and arriving late affects you and other patients whose appointments are after yours.  Also, if you no show three or more times for appointments you may be dismissed from the clinic at the providers discretion.     Again, thank you for choosing [facility].  Our hope is that these requests will decrease the amount of time that you wait before being seen by our physicians.       _____________________________________________________________  Should you have questions after your visit to [facility], please contact our office at [phone] between the hours of 8:00 a.m. and 4:30 p.m.  Voicemails left after 4:00 p.m. will not be returned until the following business day.  For prescription refill requests, have your pharmacy contact our office and allow 72 hours.    Cancer Center Support Programs:   > Cancer Support Group  2nd Tuesday of the month 1pm-2pm, Journey Room

## 2018-05-02 NOTE — Assessment & Plan Note (Signed)
1.  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  However he reported back pain and chest wall pain for 3 months which improved after orchiectomy. - Bone scan on 09/12/2017 shows extensive abnormal uptake in the shoulders, sternum, ribs, throughout the spine, pelvis and proximal to mid femurs.  CT scan of the abdomen and pelvis without contrast on 09/12/2017 shows extensive retroperitoneal adenopathy,  right hydronephrosis. - Underwent prostate biopsy (Gleason 9), bilateral orchiectomy, cystoscopy with fulguration of bladder neck, attempted right ureteral stent placement on 09/17/2017. -Most recent PSA on 09/24/2017 has improved to 422. - As he has high-volume disease, I have recommended combining ADT (orchiectomy) with either docetaxel or Abiraterone as it significantly prolongs overall survival compared to ADT alone.  He wanted to go with docetaxel as it has short course of treatment with 6 cycles. - 6 cycles of docetaxel chemotherapy from 11/19/2017 through 03/05/2018. -We discussed the results of the CT of the abdomen and pelvis dated 03/25/2018 which showed interval reduction in size of pelvic lymph nodes with lymph nodes which are now less than 10 mm short axis.  No evidence of new adenopathy in the abdomen or pelvis.  Interval marked improvement in right hydronephrosis/hydroureter.  Mild persistent hydronephrosis/hydroureter.  Measurable volume reduction in prostate gland. - His PSA on 04/10/2018 has come down to 4.52. -We discussed the results of the bone scan dated 04/29/2018 which showed widespread metastatic disease, with mildly decreased uptake.  No new lesions were seen. -I have recommended starting him on Abiraterone thousand milligrams daily along with prednisone 5 mg daily to keep his disease under control.  We talked about the side effects including but not limited to hypertension, hypokalemia, hot flashes,  fatigue among others.  He understands and gives his permission to proceed with the treatment plan. -We have sent prescription for Abiraterone to specialty pharmacy.  I plan to see him back in 2 weeks after starting Abiraterone.  2.  Bone metastasis: -He was started on denosumab, last dose on 12/31/2017. -We held his subsequent doses due to dental work.  He had deep dental cleaning about 4 weeks ago.  I plan to restart his denosumab in 2 to 3 weeks.  3.  Hypertension: -He takes ramipril 2.5 mg daily.  His blood pressure is well controlled.  Creatinine is stable at 1.5.  4.  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  Last Feraheme was on 01/22/2018.
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
## 2018-05-02 NOTE — Telephone Encounter (Signed)
Oral Oncology Pharmacist Encounter  Received new prescription for Zytiga (abiraterone) for the treatment of metastatic castration sensitive prostate cancer, planned duration until disease progression or unacceptable drug toxicity.  CMP from 04/29/2018 assessed, no relevant lab abnormalities. BP from 05/02/2018 elevated, there are current no HTN medications on his medication list continue to monitor BP.  Prescription dose and frequency assessed.   Current medication list in Epic reviewed, no DDIs with Zytiga identified.  Prescription has been e-scribed to the [facility] for benefits analysis and approval.  Oral Oncology Clinic will continue to follow for insurance authorization, copayment issues, initial counseling and start date.  Darl Pikes, PharmD, BCPS, [facility] Hematology/Oncology Clinical Pharmacist [facility] [phone]  05/02/2018 3:36 PM

## 2018-05-05 ENCOUNTER — Telehealth (HOSPITAL_COMMUNITY): Payer: Self-pay | Admitting: Pharmacy Technician

## 2018-05-05 NOTE — Telephone Encounter (Signed)
Oral Oncology Patient Advocate Encounter  After completing a benefits investigation, prior authorization for Carlos Harper is not required through Medicaid.  Patient's copay is $3.00.    [name] Phone [phone] Fax [phone] 05/05/2018 11:52 AM

## 2018-05-06 MED FILL — ABIRATERONE ACETATE 250 MG: 250 | 30 days supply | Qty: 120 | Fill #0

## 2018-05-06 NOTE — Telephone Encounter (Signed)
Oral Chemotherapy Pharmacist Encounter  Patient Education I spoke with patient's daughter Ander Purpura for overview of new oral chemotherapy medication: Zytiga (abiraterone) for the treatment of metastatic castration sensitive prostate cancer, planned duration until disease progression or unacceptable drug toxicity.   Reviewed administration, dosing, side effects, monitoring, drug-food interactions, safe handling, storage, and disposal. Patient will take Zytiga 4 tablets (1,000 mg total) by mouth daily. Take on an empty stomach 1 hour before or 2 hours after a meal.  He will also take prednisone 1 tablet (5 mg total) by mouth daily with breakfast.   Side effects include but not limited to: HTN, fatigue, decreased WBC.    Reviewed with patient importance of keeping a medication schedule and plan for any missed doses.  ZESPQZ voiced understanding and appreciation. All questions answered. Medication handout placed in the mail.  Provided patient with [facility] phone number. Patient knows to call the office with questions or concerns. Oral Chemotherapy Navigation Clinic will continue to follow.  Darl Pikes, PharmD, BCPS, [facility] Hematology/Oncology Clinical Pharmacist [facility] [phone]  05/06/2018 2:43 PM

## 2018-05-06 NOTE — Telephone Encounter (Signed)
Spoke with patients daughter and set up delivery of Zytiga.  Patient should receive medication 05/07/2018.

## 2018-05-23 ENCOUNTER — Inpatient Hospital Stay (HOSPITAL_BASED_OUTPATIENT_CLINIC_OR_DEPARTMENT_OTHER): Payer: Medicaid Other | Admitting: Hematology

## 2018-05-23 ENCOUNTER — Inpatient Hospital Stay (HOSPITAL_COMMUNITY): Payer: Medicaid Other

## 2018-05-23 ENCOUNTER — Encounter (HOSPITAL_COMMUNITY): Payer: Self-pay | Admitting: Hematology

## 2018-05-23 VITALS — BP 137/77 | HR 70 | Temp 98.5°F | Resp 16 | Wt 173.4 lb

## 2018-05-23 DIAGNOSIS — C61 Malignant neoplasm of prostate: Secondary | ICD-10-CM

## 2018-05-23 DIAGNOSIS — N189 Chronic kidney disease, unspecified: Secondary | ICD-10-CM

## 2018-05-23 DIAGNOSIS — D631 Anemia in chronic kidney disease: Secondary | ICD-10-CM

## 2018-05-23 DIAGNOSIS — D509 Iron deficiency anemia, unspecified: Secondary | ICD-10-CM

## 2018-05-23 DIAGNOSIS — C7951 Secondary malignant neoplasm of bone: Secondary | ICD-10-CM

## 2018-05-23 DIAGNOSIS — I129 Hypertensive chronic kidney disease with stage 1 through stage 4 chronic kidney disease, or unspecified chronic kidney disease: Secondary | ICD-10-CM

## 2018-05-23 DIAGNOSIS — K5900 Constipation, unspecified: Secondary | ICD-10-CM

## 2018-05-23 LAB — COMPREHENSIVE METABOLIC PANEL
ALT: 17 U/L (ref 0–44)
AST: 21 U/L (ref 15–41)
Albumin: 4.1 g/dL (ref 3.5–5.0)
Alkaline Phosphatase: 61 U/L (ref 38–126)
Anion gap: 8 (ref 5–15)
BUN: 23 mg/dL (ref 8–23)
CO2: 25 mmol/L (ref 22–32)
CREATININE: 1.62 mg/dL — AB (ref 0.61–1.24)
Calcium: 9 mg/dL (ref 8.9–10.3)
Chloride: 109 mmol/L (ref 98–111)
GFR calc Af Amer: 46 mL/min — ABNORMAL LOW (ref >60)
GFR calc non Af Amer: 40 mL/min — ABNORMAL LOW (ref >60)
Glucose, Bld: 86 mg/dL (ref 70–99)
Potassium: 4.6 mmol/L (ref 3.5–5.1)
SODIUM: 142 mmol/L (ref 135–145)
Total Bilirubin: 1 mg/dL (ref 0.3–1.2)
Total Protein: 7.1 g/dL (ref 6.5–8.1)

## 2018-05-23 LAB — CBC WITH DIFFERENTIAL/PLATELET
ABS IMMATURE GRANULOCYTES: 0 10*3/uL (ref 0.00–0.07)
BASOS PCT: 0 %
Basophils Absolute: 0 10*3/uL (ref 0.0–0.1)
Eosinophils Absolute: 0 10*3/uL (ref 0.0–0.5)
Eosinophils Relative: 1 %
HCT: 38.1 % — ABNORMAL LOW (ref 39.0–52.0)
HEMOGLOBIN: 12.2 g/dL — AB (ref 13.0–17.0)
Immature Granulocytes: 0 %
Lymphocytes Relative: 50 %
Lymphs Abs: 1.5 10*3/uL (ref 0.7–4.0)
MCH: 30 pg (ref 26.0–34.0)
MCHC: 32 g/dL (ref 30.0–36.0)
MCV: 93.6 fL (ref 80.0–100.0)
Monocytes Absolute: 0.2 10*3/uL (ref 0.1–1.0)
Monocytes Relative: 7 %
NEUTROS ABS: 1.3 10*3/uL — AB (ref 1.7–7.7)
Neutrophils Relative %: 42 %
Platelets: 208 10*3/uL (ref 150–400)
RBC: 4.07 MIL/uL — AB (ref 4.22–5.81)
RDW: 13.2 % (ref 11.5–15.5)
WBC: 3.1 10*3/uL — ABNORMAL LOW (ref 4.0–10.5)
nRBC: 0 % (ref 0.0–0.2)

## 2018-05-23 LAB — PSA: Prostatic Specific Antigen: 2.88 ng/mL (ref 0.00–4.00)
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
## 2018-05-23 NOTE — Patient Instructions (Signed)
[facility] at [facility] Discharge Instructions  Follow up in 2 weeks with labs    Thank you for choosing [facility] at [facility] to provide your oncology and hematology care.  To afford each patient quality time with our provider, please arrive at least 15 minutes before your scheduled appointment time.   If you have a lab appointment with the Cancer Center please come in thru the  Main Entrance and check in at the main information desk  You need to re-schedule your appointment should you arrive 10 or more minutes late.  We strive to give you quality time with our providers, and arriving late affects you and other patients whose appointments are after yours.  Also, if you no show three or more times for appointments you may be dismissed from the clinic at the providers discretion.     Again, thank you for choosing [facility].  Our hope is that these requests will decrease the amount of time that you wait before being seen by our physicians.       _____________________________________________________________  Should you have questions after your visit to [facility], please contact our office at [phone] between the hours of 8:00 a.m. and 4:30 p.m.  Voicemails left after 4:00 p.m. will not be returned until the following business day.  For prescription refill requests, have your pharmacy contact our office and allow 72 hours.    Cancer Center Support Programs:   > Cancer Support Group  2nd Tuesday of the month 1pm-2pm, Journey Room    

## 2018-05-23 NOTE — Assessment & Plan Note (Signed)
1.  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  However he reported back pain and chest wall pain for 3 months which improved after orchiectomy. - Bone scan on 09/12/2017 shows extensive abnormal uptake in the shoulders, sternum, ribs, throughout the spine, pelvis and proximal to mid femurs.  CT scan of the abdomen and pelvis without contrast on 09/12/2017 shows extensive retroperitoneal adenopathy,  right hydronephrosis. - Underwent prostate biopsy (Gleason 9), bilateral orchiectomy, cystoscopy with fulguration of bladder neck, attempted right ureteral stent placement on 09/17/2017. -Most recent PSA on 09/24/2017 has improved to 422. - As he has high-volume disease, I have recommended combining ADT (orchiectomy) with either docetaxel or Abiraterone as it significantly prolongs overall survival compared to ADT alone.  He wanted to go with docetaxel as it has short course of treatment with 6 cycles. - 6 cycles of docetaxel chemotherapy from 11/19/2017 through 03/05/2018. -CT abdomen pelvis dated 03/25/2018 showed interval reduction in size of pelvic lymph nodes with lymph nodes which are now less than 10 mm in short axis.  No evidence of new adenopathy in abdomen or pelvis.  Interval marked improvement in the right hydronephrosis/hydroureter.  Mild persistent hydronephrosis/hydroureter.  Measurable volume reduction in the prostate gland.   - Last PSA on 04/10/2018 was 4.52.  PSA from today is pending. -Bone scan dated 04/29/2018 shows widespread metastatic disease, mildly decreased in uptake with no new lesions.  - Zytiga 1000mg  daily with prednisone 5 mg daily started on 05/07/2018. -He tolerated 2 weeks of Zytiga very well.  Tiredness is improving. -I have reviewed his blood work which is also stable.  Hemoglobin is improving.  PSA is pending. -We will see him back in 2 weeks for follow-up.  2.  Bone metastasis: -He was  started on denosumab.  Last dose was on 12/31/2017. -Subsequent doses were held because of dental work.  He will see dentist on 05/28/2022 plate.  We plan to resume denosumab and next 2 weeks.  3.  Hypertension: -He takes ramipril 2.5 mg daily.  His blood pressure is well controlled.  Creatinine is stable at 1.5.  4.  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  He did receive Feraheme in the past. -Hemoglobin is gradually improving back to normal.

## 2018-05-28 ENCOUNTER — Other Ambulatory Visit (HOSPITAL_COMMUNITY): Payer: Self-pay | Admitting: Nurse Practitioner

## 2018-05-28 DIAGNOSIS — C61 Malignant neoplasm of prostate: Secondary | ICD-10-CM

## 2018-06-02 MED FILL — ABIRATERONE ACETATE 250 MG: 250 | 30 days supply | Qty: 120 | Fill #0

## 2018-06-10 ENCOUNTER — Ambulatory Visit (HOSPITAL_COMMUNITY): Payer: Medicaid Other | Admitting: Hematology

## 2018-06-10 ENCOUNTER — Inpatient Hospital Stay (HOSPITAL_COMMUNITY): Payer: Medicaid Other | Attending: Hematology

## 2018-06-10 DIAGNOSIS — K5900 Constipation, unspecified: Secondary | ICD-10-CM | POA: Diagnosis not present

## 2018-06-10 DIAGNOSIS — C7951 Secondary malignant neoplasm of bone: Secondary | ICD-10-CM | POA: Diagnosis not present

## 2018-06-10 DIAGNOSIS — D631 Anemia in chronic kidney disease: Secondary | ICD-10-CM | POA: Insufficient documentation

## 2018-06-10 DIAGNOSIS — D509 Iron deficiency anemia, unspecified: Secondary | ICD-10-CM | POA: Insufficient documentation

## 2018-06-10 DIAGNOSIS — N189 Chronic kidney disease, unspecified: Secondary | ICD-10-CM | POA: Insufficient documentation

## 2018-06-10 DIAGNOSIS — C61 Malignant neoplasm of prostate: Secondary | ICD-10-CM | POA: Diagnosis present

## 2018-06-10 DIAGNOSIS — I129 Hypertensive chronic kidney disease with stage 1 through stage 4 chronic kidney disease, or unspecified chronic kidney disease: Secondary | ICD-10-CM | POA: Diagnosis not present

## 2018-06-10 DIAGNOSIS — R110 Nausea: Secondary | ICD-10-CM | POA: Insufficient documentation

## 2018-06-10 LAB — CBC WITH DIFFERENTIAL/PLATELET
Abs Immature Granulocytes: 0 10*3/uL (ref 0.00–0.07)
Basophils Absolute: 0 10*3/uL (ref 0.0–0.1)
Basophils Relative: 1 %
EOS ABS: 0 10*3/uL (ref 0.0–0.5)
EOS PCT: 1 %
HCT: 36.2 % — ABNORMAL LOW (ref 39.0–52.0)
Hemoglobin: 11.6 g/dL — ABNORMAL LOW (ref 13.0–17.0)
Immature Granulocytes: 0 %
Lymphocytes Relative: 46 %
Lymphs Abs: 1.4 10*3/uL (ref 0.7–4.0)
MCH: 29.3 pg (ref 26.0–34.0)
MCHC: 32 g/dL (ref 30.0–36.0)
MCV: 91.4 fL (ref 80.0–100.0)
Monocytes Absolute: 0.3 10*3/uL (ref 0.1–1.0)
Monocytes Relative: 9 %
Neutro Abs: 1.3 10*3/uL — ABNORMAL LOW (ref 1.7–7.7)
Neutrophils Relative %: 43 %
Platelets: 185 10*3/uL (ref 150–400)
RBC: 3.96 MIL/uL — ABNORMAL LOW (ref 4.22–5.81)
RDW: 13.2 % (ref 11.5–15.5)
WBC: 3 10*3/uL — ABNORMAL LOW (ref 4.0–10.5)
nRBC: 0 % (ref 0.0–0.2)

## 2018-06-10 LAB — COMPREHENSIVE METABOLIC PANEL
ALT: 21 U/L (ref 0–44)
AST: 23 U/L (ref 15–41)
Albumin: 3.8 g/dL (ref 3.5–5.0)
Alkaline Phosphatase: 47 U/L (ref 38–126)
Anion gap: 7 (ref 5–15)
BUN: 20 mg/dL (ref 8–23)
CO2: 25 mmol/L (ref 22–32)
Calcium: 8.6 mg/dL — ABNORMAL LOW (ref 8.9–10.3)
Chloride: 108 mmol/L (ref 98–111)
Creatinine, Ser: 1.58 mg/dL — ABNORMAL HIGH (ref 0.61–1.24)
GFR calc Af Amer: 48 mL/min — ABNORMAL LOW (ref >60)
GFR calc non Af Amer: 41 mL/min — ABNORMAL LOW (ref >60)
Glucose, Bld: 105 mg/dL — ABNORMAL HIGH (ref 70–99)
Potassium: 4.2 mmol/L (ref 3.5–5.1)
Sodium: 140 mmol/L (ref 135–145)
TOTAL PROTEIN: 6.8 g/dL (ref 6.5–8.1)
Total Bilirubin: 1 mg/dL (ref 0.3–1.2)

## 2018-06-10 LAB — MAGNESIUM: Magnesium: 2.2 mg/dL (ref 1.7–2.4)

## 2018-06-10 LAB — PSA: PROSTATIC SPECIFIC ANTIGEN: 2.19 ng/mL (ref 0.00–4.00)

## 2018-06-11 ENCOUNTER — Encounter (HOSPITAL_COMMUNITY): Payer: Self-pay | Admitting: Hematology

## 2018-06-11 ENCOUNTER — Other Ambulatory Visit: Payer: Self-pay

## 2018-06-11 ENCOUNTER — Inpatient Hospital Stay (HOSPITAL_BASED_OUTPATIENT_CLINIC_OR_DEPARTMENT_OTHER): Payer: Medicaid Other | Admitting: Hematology

## 2018-06-11 VITALS — BP 169/80 | HR 58 | Temp 97.8°F | Resp 16 | Wt 172.0 lb

## 2018-06-11 DIAGNOSIS — R110 Nausea: Secondary | ICD-10-CM | POA: Diagnosis not present

## 2018-06-11 DIAGNOSIS — K5900 Constipation, unspecified: Secondary | ICD-10-CM | POA: Diagnosis not present

## 2018-06-11 DIAGNOSIS — D509 Iron deficiency anemia, unspecified: Secondary | ICD-10-CM

## 2018-06-11 DIAGNOSIS — C61 Malignant neoplasm of prostate: Secondary | ICD-10-CM | POA: Diagnosis not present

## 2018-06-11 DIAGNOSIS — D631 Anemia in chronic kidney disease: Secondary | ICD-10-CM

## 2018-06-11 DIAGNOSIS — C7951 Secondary malignant neoplasm of bone: Secondary | ICD-10-CM | POA: Diagnosis not present

## 2018-06-11 DIAGNOSIS — I129 Hypertensive chronic kidney disease with stage 1 through stage 4 chronic kidney disease, or unspecified chronic kidney disease: Secondary | ICD-10-CM

## 2018-06-11 DIAGNOSIS — N189 Chronic kidney disease, unspecified: Secondary | ICD-10-CM

## 2018-06-11 NOTE — Patient Instructions (Signed)
[facility] at [facility] Discharge Instructions  Follow up in 3 weeks with labs   Thank you for choosing [facility] at [facility] to provide your oncology and hematology care.  To afford each patient quality time with our provider, please arrive at least 15 minutes before your scheduled appointment time.   If you have a lab appointment with the Cancer Center please come in thru the  Main Entrance and check in at the main information desk  You need to re-schedule your appointment should you arrive 10 or more minutes late.  We strive to give you quality time with our providers, and arriving late affects you and other patients whose appointments are after yours.  Also, if you no show three or more times for appointments you may be dismissed from the clinic at the providers discretion.     Again, thank you for choosing [facility].  Our hope is that these requests will decrease the amount of time that you wait before being seen by our physicians.       _____________________________________________________________  Should you have questions after your visit to  Cancer Center, please contact our office at [phone] between the hours of 8:00 a.m. and 4:30 p.m.  Voicemails left after 4:00 p.m. will not be returned until the following business day.  For prescription refill requests, have your pharmacy contact our office and allow 72 hours.    Cancer Center Support Programs:   > Cancer Support Group  2nd Tuesday of the month 1pm-2pm, Journey Room    

## 2018-06-11 NOTE — Assessment & Plan Note (Signed)
1.  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  However he reported back pain and chest wall pain for 3 months which improved after orchiectomy. - Bone scan on 09/12/2017 shows extensive abnormal uptake in the shoulders, sternum, ribs, throughout the spine, pelvis and proximal to mid femurs.  CT scan of the abdomen and pelvis without contrast on 09/12/2017 shows extensive retroperitoneal adenopathy,  right hydronephrosis. - Underwent prostate biopsy (Gleason 9), bilateral orchiectomy, cystoscopy with fulguration of bladder neck, attempted right ureteral stent placement on 09/17/2017. -PSA on 09/24/2017 was 422. - 6 cycles of docetaxel chemotherapy from 11/19/2017 through 03/05/2018. -CT abdomen pelvis dated 03/25/2018 showed interval reduction in size of pelvic lymph nodes with lymph nodes which are now less than 10 mm in short axis.  No evidence of new adenopathy in abdomen or pelvis.  Interval marked improvement in the right hydronephrosis/hydroureter.  Mild persistent hydronephrosis/hydroureter.  Measurable volume reduction in the prostate gland.   - Last PSA on 04/10/2018 was 4.52.  PSA from today is pending. -Bone scan dated 04/29/2018 shows widespread metastatic disease, mildly decreased in uptake with no new lesions.  - Zytiga thousand milligrams daily and prednisone 5 mg daily started on 05/07/2018. -He is tolerating it very well without any GI side effects.  Blood pressure is minimally elevated today at 973 systolic.  We will keep a close eye on it. -His PSA has improved to 2.1. - He will continue Zytiga at the same dose.  He will come back in 3 weeks for follow-up and repeat blood work.  2.  Bone metastasis: -He was started on denosumab.  Last dose was on 12/31/2017. -Subsequent doses were held because of dental work.  He will go and see his dentist today.  Will resume denosumab after all dental work is done.    3.  Hypertension: -He takes ramipril 2.5 mg daily.  His blood pressure is well controlled.  Creatinine is stable at 1.5.  4.  Normocytic anemia:  -This is from a combination of CKD and IDA.  He did receive Feraheme in the past. -Hemoglobin is staying stable.
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
## 2018-06-30 ENCOUNTER — Other Ambulatory Visit (HOSPITAL_COMMUNITY): Payer: Self-pay | Admitting: Nurse Practitioner

## 2018-06-30 DIAGNOSIS — C61 Malignant neoplasm of prostate: Secondary | ICD-10-CM

## 2018-07-02 MED FILL — ABIRATERONE ACETATE 250 MG: 250 | 30 days supply | Qty: 120 | Fill #0

## 2018-07-04 ENCOUNTER — Other Ambulatory Visit: Payer: Self-pay

## 2018-07-04 ENCOUNTER — Inpatient Hospital Stay (HOSPITAL_COMMUNITY): Payer: Medicaid Other

## 2018-07-04 ENCOUNTER — Encounter (HOSPITAL_COMMUNITY): Payer: Self-pay | Admitting: Hematology

## 2018-07-04 ENCOUNTER — Inpatient Hospital Stay (HOSPITAL_COMMUNITY): Payer: Medicaid Other | Attending: Hematology | Admitting: Hematology

## 2018-07-04 VITALS — BP 147/86 | HR 63 | Temp 98.1°F | Resp 16 | Wt 171.6 lb

## 2018-07-04 DIAGNOSIS — C7951 Secondary malignant neoplasm of bone: Secondary | ICD-10-CM | POA: Diagnosis not present

## 2018-07-04 DIAGNOSIS — I10 Essential (primary) hypertension: Secondary | ICD-10-CM | POA: Insufficient documentation

## 2018-07-04 DIAGNOSIS — D631 Anemia in chronic kidney disease: Secondary | ICD-10-CM | POA: Insufficient documentation

## 2018-07-04 DIAGNOSIS — C61 Malignant neoplasm of prostate: Secondary | ICD-10-CM

## 2018-07-04 DIAGNOSIS — N189 Chronic kidney disease, unspecified: Secondary | ICD-10-CM | POA: Insufficient documentation

## 2018-07-04 DIAGNOSIS — D509 Iron deficiency anemia, unspecified: Secondary | ICD-10-CM | POA: Insufficient documentation

## 2018-07-04 LAB — COMPREHENSIVE METABOLIC PANEL
ALK PHOS: 55 U/L (ref 38–126)
ALT: 19 U/L (ref 0–44)
AST: 23 U/L (ref 15–41)
Albumin: 4 g/dL (ref 3.5–5.0)
Anion gap: 8 (ref 5–15)
BILIRUBIN TOTAL: 1.1 mg/dL (ref 0.3–1.2)
BUN: 22 mg/dL (ref 8–23)
CO2: 26 mmol/L (ref 22–32)
CREATININE: 1.67 mg/dL — AB (ref 0.61–1.24)
Calcium: 9 mg/dL (ref 8.9–10.3)
Chloride: 106 mmol/L (ref 98–111)
GFR calc Af Amer: 45 mL/min — ABNORMAL LOW (ref >60)
GFR, EST NON AFRICAN AMERICAN: 39 mL/min — AB (ref >60)
Glucose, Bld: 90 mg/dL (ref 70–99)
Potassium: 3.5 mmol/L (ref 3.5–5.1)
Sodium: 140 mmol/L (ref 135–145)
Total Protein: 6.8 g/dL (ref 6.5–8.1)

## 2018-07-04 LAB — CBC WITH DIFFERENTIAL/PLATELET
ABS IMMATURE GRANULOCYTES: 0 10*3/uL (ref 0.00–0.07)
Basophils Absolute: 0 10*3/uL (ref 0.0–0.1)
Basophils Relative: 1 %
Eosinophils Absolute: 0 10*3/uL (ref 0.0–0.5)
Eosinophils Relative: 1 %
HEMATOCRIT: 36.9 % — AB (ref 39.0–52.0)
HEMOGLOBIN: 12.2 g/dL — AB (ref 13.0–17.0)
Immature Granulocytes: 0 %
LYMPHS ABS: 1.7 10*3/uL (ref 0.7–4.0)
Lymphocytes Relative: 49 %
MCH: 30.3 pg (ref 26.0–34.0)
MCHC: 33.1 g/dL (ref 30.0–36.0)
MCV: 91.8 fL (ref 80.0–100.0)
Monocytes Absolute: 0.3 10*3/uL (ref 0.1–1.0)
Monocytes Relative: 8 %
Neutro Abs: 1.4 10*3/uL — ABNORMAL LOW (ref 1.7–7.7)
Neutrophils Relative %: 41 %
Platelets: 211 10*3/uL (ref 150–400)
RBC: 4.02 MIL/uL — ABNORMAL LOW (ref 4.22–5.81)
RDW: 13.6 % (ref 11.5–15.5)
WBC: 3.4 10*3/uL — ABNORMAL LOW (ref 4.0–10.5)
nRBC: 0 % (ref 0.0–0.2)

## 2018-07-04 LAB — IRON AND TIBC
Iron: 72 ug/dL (ref 45–182)
Saturation Ratios: 24 % (ref 17.9–39.5)
TIBC: 302 ug/dL (ref 250–450)
UIBC: 230 ug/dL

## 2018-07-04 LAB — FOLATE: Folate: 17 ng/mL (ref >5.9)

## 2018-07-04 LAB — PSA: Prostatic Specific Antigen: 1.73 ng/mL (ref 0.00–4.00)

## 2018-07-04 LAB — MAGNESIUM: Magnesium: 2.2 mg/dL (ref 1.7–2.4)

## 2018-07-04 LAB — FERRITIN: FERRITIN: 233 ng/mL (ref 24–336)

## 2018-07-04 LAB — VITAMIN B12: Vitamin B-12: 328 pg/mL (ref 180–914)

## 2018-07-04 MED ORDER — DENOSUMAB 120 MG/1.7ML ~~LOC~~ SOLN
120.0000 mg | Freq: Once | SUBCUTANEOUS | Status: AC
  Administered 2018-07-04: 120 mg via SUBCUTANEOUS
  Filled 2018-07-04: qty 1.7

## 2018-07-04 NOTE — Patient Instructions (Addendum)
[place] at [facility] Discharge Instructions  You were seen today by Dr. Delton Coombes. He went over your recent lab results. He wants to restart his Xgeva.  He will see you back in 4 weeks for labs and follow up.    Thank you for choosing [place] at [facility] to provide your oncology and hematology care.  To afford each patient quality time with our provider, please arrive at least 15 minutes before your scheduled appointment time.   If you have a lab appointment with the [facility] please come in thru the  Main Entrance and check in at the main information desk  You need to re-schedule your appointment should you arrive 10 or more minutes late.  We strive to give you quality time with our providers, and arriving late affects you and other patients whose appointments are after yours.  Also, if you no show three or more times for appointments you may be dismissed from the clinic at the providers discretion.     Again, thank you for choosing [facility].  Our hope is that these requests will decrease the amount of time that you wait before being seen by our physicians.       _____________________________________________________________  Should you have questions after your visit to [facility], please contact our office at [phone] between the hours of 8:00 a.m. and 4:30 p.m.  Voicemails left after 4:00 p.m. will not be returned until the following business day.  For prescription refill requests, have your pharmacy contact our office and allow 72 hours.    Cancer Center Support Programs:   > Cancer Support Group  2nd Tuesday of the month 1pm-2pm, Journey Room
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
## 2018-07-04 NOTE — Progress Notes (Signed)
Patient is taking zytiga and has not missed any doses and reports no side effects at this time.   

## 2018-07-04 NOTE — Assessment & Plan Note (Signed)
1.  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  However he reported back pain and chest wall pain for 3 months which improved after orchiectomy. - Bone scan on 09/12/2017 shows extensive abnormal uptake in the shoulders, sternum, ribs, throughout the spine, pelvis and proximal to mid femurs.  CT scan of the abdomen and pelvis without contrast on 09/12/2017 shows extensive retroperitoneal adenopathy,  right hydronephrosis. - Underwent prostate biopsy (Gleason 9), bilateral orchiectomy, cystoscopy with fulguration of bladder neck, attempted right ureteral stent placement on 09/17/2017. -PSA on 09/24/2017 was 422. - 6 cycles of docetaxel chemotherapy from 11/19/2017 through 03/05/2018. -CT abdomen pelvis dated 03/25/2018 showed interval reduction in size of pelvic lymph nodes with lymph nodes which are now less than 10 mm in short axis.  No evidence of new adenopathy in abdomen or pelvis.  Interval marked improvement in the right hydronephrosis/hydroureter.  Mild persistent hydronephrosis/hydroureter.  Measurable volume reduction in the prostate gland.   - Last PSA on 04/10/2018 was 4.52.  PSA from today is pending. -Bone scan dated 04/29/2018 shows widespread metastatic disease, mildly decreased in uptake with no new lesions.  - Zytiga and prednisone 5 mg daily started on 05/07/2018. -He is tolerating it very well.  Blood pressure is better and potassium is normal. -His last PSA was down to 2.19 on 06/10/2018. -He will continue thousand milligrams of Zytiga daily. -He will be seen back in 4 weeks for follow-up.  Zytiga thousand milligrams daily and prednisone 5 mg daily started on 05/07/2018.  2.  Bone metastasis: -His last dose of denosumab was on 12/31/2017.  He had dental work done after that. -He may restart his denosumab today.  He will continue calcium supplements.  3.  Hypertension: -He is taking ramipril 2.5 mg  daily.  Systolic blood pressure is 147 today.  4.  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  He did receive Feraheme in the past. -Today his hemoglobin improved to 12.2.

## 2018-07-24 ENCOUNTER — Other Ambulatory Visit (HOSPITAL_COMMUNITY): Payer: Self-pay | Admitting: Nurse Practitioner

## 2018-07-24 DIAGNOSIS — C61 Malignant neoplasm of prostate: Secondary | ICD-10-CM

## 2018-07-28 ENCOUNTER — Other Ambulatory Visit (HOSPITAL_COMMUNITY): Payer: Self-pay | Admitting: Nurse Practitioner

## 2018-07-28 DIAGNOSIS — C61 Malignant neoplasm of prostate: Secondary | ICD-10-CM

## 2018-07-28 MED FILL — ABIRATERONE ACETATE 250 MG: 250 | 30 days supply | Qty: 120 | Fill #0

## 2018-08-05 ENCOUNTER — Inpatient Hospital Stay (HOSPITAL_COMMUNITY): Payer: Medicaid Other | Attending: Hematology

## 2018-08-05 ENCOUNTER — Other Ambulatory Visit: Payer: Self-pay

## 2018-08-05 DIAGNOSIS — C61 Malignant neoplasm of prostate: Secondary | ICD-10-CM | POA: Insufficient documentation

## 2018-08-05 DIAGNOSIS — N189 Chronic kidney disease, unspecified: Secondary | ICD-10-CM | POA: Diagnosis not present

## 2018-08-05 DIAGNOSIS — D631 Anemia in chronic kidney disease: Secondary | ICD-10-CM | POA: Insufficient documentation

## 2018-08-05 DIAGNOSIS — C7951 Secondary malignant neoplasm of bone: Secondary | ICD-10-CM | POA: Diagnosis not present

## 2018-08-05 DIAGNOSIS — D509 Iron deficiency anemia, unspecified: Secondary | ICD-10-CM | POA: Insufficient documentation

## 2018-08-05 LAB — CBC WITH DIFFERENTIAL/PLATELET
Abs Immature Granulocytes: 0.01 10*3/uL (ref 0.00–0.07)
Basophils Absolute: 0 10*3/uL (ref 0.0–0.1)
Basophils Relative: 1 %
Eosinophils Absolute: 0 10*3/uL (ref 0.0–0.5)
Eosinophils Relative: 1 %
HCT: 36.2 % — ABNORMAL LOW (ref 39.0–52.0)
Hemoglobin: 12 g/dL — ABNORMAL LOW (ref 13.0–17.0)
Immature Granulocytes: 0 %
Lymphocytes Relative: 50 %
Lymphs Abs: 1.9 10*3/uL (ref 0.7–4.0)
MCH: 30.5 pg (ref 26.0–34.0)
MCHC: 33.1 g/dL (ref 30.0–36.0)
MCV: 92.1 fL (ref 80.0–100.0)
Monocytes Absolute: 0.3 10*3/uL (ref 0.1–1.0)
Monocytes Relative: 8 %
Neutro Abs: 1.5 10*3/uL — ABNORMAL LOW (ref 1.7–7.7)
Neutrophils Relative %: 40 %
Platelets: 201 10*3/uL (ref 150–400)
RBC: 3.93 MIL/uL — ABNORMAL LOW (ref 4.22–5.81)
RDW: 14 % (ref 11.5–15.5)
WBC: 3.8 10*3/uL — ABNORMAL LOW (ref 4.0–10.5)
nRBC: 0 % (ref 0.0–0.2)

## 2018-08-05 LAB — COMPREHENSIVE METABOLIC PANEL
ALT: 25 U/L (ref 0–44)
AST: 21 U/L (ref 15–41)
Albumin: 4 g/dL (ref 3.5–5.0)
Alkaline Phosphatase: 58 U/L (ref 38–126)
Anion gap: 7 (ref 5–15)
BUN: 22 mg/dL (ref 8–23)
CO2: 26 mmol/L (ref 22–32)
Calcium: 8.8 mg/dL — ABNORMAL LOW (ref 8.9–10.3)
Chloride: 106 mmol/L (ref 98–111)
Creatinine, Ser: 1.53 mg/dL — ABNORMAL HIGH (ref 0.61–1.24)
GFR calc Af Amer: 50 mL/min — ABNORMAL LOW (ref >60)
GFR calc non Af Amer: 43 mL/min — ABNORMAL LOW (ref >60)
Glucose, Bld: 100 mg/dL — ABNORMAL HIGH (ref 70–99)
Potassium: 4 mmol/L (ref 3.5–5.1)
Sodium: 139 mmol/L (ref 135–145)
Total Bilirubin: 1.2 mg/dL (ref 0.3–1.2)
Total Protein: 7.3 g/dL (ref 6.5–8.1)

## 2018-08-05 LAB — MAGNESIUM: Magnesium: 2.3 mg/dL (ref 1.7–2.4)

## 2018-08-05 LAB — PSA: Prostatic Specific Antigen: 1.07 ng/mL (ref 0.00–4.00)

## 2018-08-06 ENCOUNTER — Inpatient Hospital Stay (HOSPITAL_BASED_OUTPATIENT_CLINIC_OR_DEPARTMENT_OTHER): Payer: Medicaid Other | Admitting: Nurse Practitioner

## 2018-08-06 ENCOUNTER — Inpatient Hospital Stay (HOSPITAL_COMMUNITY): Payer: Medicaid Other

## 2018-08-06 ENCOUNTER — Other Ambulatory Visit (HOSPITAL_COMMUNITY): Payer: Medicaid Other

## 2018-08-06 ENCOUNTER — Other Ambulatory Visit: Payer: Self-pay

## 2018-08-06 DIAGNOSIS — N189 Chronic kidney disease, unspecified: Secondary | ICD-10-CM

## 2018-08-06 DIAGNOSIS — D631 Anemia in chronic kidney disease: Secondary | ICD-10-CM

## 2018-08-06 DIAGNOSIS — C61 Malignant neoplasm of prostate: Secondary | ICD-10-CM | POA: Diagnosis not present

## 2018-08-06 DIAGNOSIS — D509 Iron deficiency anemia, unspecified: Secondary | ICD-10-CM

## 2018-08-06 DIAGNOSIS — C7951 Secondary malignant neoplasm of bone: Secondary | ICD-10-CM

## 2018-08-06 MED ORDER — DENOSUMAB 120 MG/1.7ML ~~LOC~~ SOLN
SUBCUTANEOUS | Status: AC
  Filled 2018-08-06: qty 1.7

## 2018-08-06 MED ORDER — SODIUM CHLORIDE 0.9% FLUSH
10.0000 mL | Freq: Once | INTRAVENOUS | Status: AC
  Administered 2018-08-06: 10 mL via INTRAVENOUS

## 2018-08-06 MED ORDER — HEPARIN SOD (PORK) LOCK FLUSH 100 UNIT/ML IV SOLN
500.0000 [IU] | Freq: Once | INTRAVENOUS | Status: AC
  Administered 2018-08-06: 500 [IU] via INTRAVENOUS

## 2018-08-06 MED ORDER — DENOSUMAB 120 MG/1.7ML ~~LOC~~ SOLN
120.0000 mg | Freq: Once | SUBCUTANEOUS | Status: AC
  Administered 2018-08-06: 120 mg via SUBCUTANEOUS

## 2018-08-06 NOTE — Progress Notes (Signed)
Carlos Harper, [place] [address]   CLINIC:  Medical Oncology/Hematology  PCP:  Raiford Simmonds., PA-C [address] [place] [address] [phone]   REASON FOR VISIT: Follow-up for metastatic castration sensitive prostate cancer to the bones  CURRENT THERAPY: 1000 mg of Zytiga and 5 mg of prednisone daily  BRIEF ONCOLOGIC HISTORY:    Malignant neoplasm of prostate ([place])   09/27/2017 Initial Diagnosis    Malignant neoplasm of prostate ([place])    10/31/2017 -  Chemotherapy    The patient had pegfilgrastim (NEULASTA) injection 6 mg, 6 mg, Subcutaneous, Once, 6 of 6 cycles Administration: 6 mg (11/21/2017), 6 mg (12/12/2017), 6 mg (01/02/2018), 6 mg (01/24/2018), 6 mg (02/14/2018), 6 mg (03/07/2018) pegfilgrastim (NEULASTA ONPRO KIT) injection 6 mg, 6 mg, Subcutaneous, Once, 2 of 2 cycles DOCEtaxel (TAXOTERE) 140 mg in sodium chloride 0.9 % 250 mL chemo infusion, 75 mg/m2 = 140 mg, Intravenous,  Once, 6 of 6 cycles Administration: 140 mg (11/19/2017), 140 mg (12/10/2017), 140 mg (01/22/2018), 140 mg (12/31/2017), 140 mg (02/12/2018), 140 mg (03/05/2018) ondansetron (ZOFRAN) 8 mg, dexamethasone (DECADRON) 10 mg in sodium chloride 0.9 % 50 mL IVPB, , Intravenous,  Once, 4 of 4 cycles Administration:  (12/31/2017),  (01/22/2018),  (02/12/2018),  (03/05/2018)  for chemotherapy treatment.       INTERVAL HISTORY:  Carlos Harper 78 y.o. male returns for routine follow-up metastatic prostate cancer.  He is here today and doing well since his last visit.  He reports he does have mild fatigue from time to time.  He reports occasional leg swelling due to surgeries on his leg.  He also has been having vertigo issues off and on.  Other than that he denies any other side effects.  He is taking his Zytiga as prescribed 1 hour before eating. Denies any nausea, vomiting, or diarrhea. Denies any new pains. Had not noticed any recent bleeding such as epistaxis, hematuria or  hematochezia. Denies recent chest pain on exertion, shortness of breath on minimal exertion, pre-syncopal episodes, or palpitations. Denies any numbness or tingling in hands or feet. Denies any recent fevers, infections, or recent hospitalizations. Patient reports appetite at 100% and energy level at 25%.  He is eating well and maintaining his weight at this time.    REVIEW OF SYSTEMS:  Review of Systems  Constitutional: Positive for fatigue.  Cardiovascular: Positive for leg swelling.  Gastrointestinal: Positive for constipation.  Neurological: Positive for dizziness.  All other systems reviewed and are negative.    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 ([place]) 09/27/2017   Sickle cell trait ([place])    Vertigo    Past Surgical History:  Procedure Laterality Date   CYSTOSCOPY WITH FULGERATION  09/17/2017   Procedure: CYSTOSCOPY WITH FULGERATION OF BLADDER NECK;  Surgeon: [name], MD;  Location: [facility];  Service: Urology;;   CYSTOSCOPY WITH STENT PLACEMENT Right 09/17/2017   Procedure: CYSTOSCOPY WITH RIGHT RETROGRADE PYELOGRAM ATTEMPTED STENT PLACEMENT;  Surgeon: [name], MD;  Location: [facility];  Service: Urology;  Laterality: Right;   left leg surgery due to [place] Bilateral 09/17/2017   Procedure: ORCHIECTOMY;  Surgeon: [name], MD;  Location: [facility];  Service: Urology;  Laterality: Bilateral;   PORTACATH PLACEMENT Left 10/14/2017   Procedure: INSERTION PORT-A-CATH;  Surgeon: Aviva Signs,  MD;  Location: [facility];  Service: General;  Laterality: Left;   PROSTATE BIOPSY N/A 09/17/2017   Procedure: BIOPSY TRANSRECTAL ULTRASONIC PROSTATE (TUBP);  Surgeon: [name], MD;  Location: [facility];  Service: Urology;  Laterality: N/A;     SOCIAL HISTORY:  Social History   Socioeconomic History   Marital status: Single    Spouse name: Not on file     Number of children: Not on file   Years of education: Not on file   Highest education level: Not on file  Occupational History   Not on file  Social Needs   Financial resource strain: Not on file   Food insecurity:    Worry: Not on file    Inability: Not on file   Transportation needs:    Medical: Not on file    Non-medical: Not on file  Tobacco Use   Smoking status: Never Smoker   Smokeless tobacco: Never Used  Substance and Sexual Activity   Alcohol use: Yes    Comment: Drinks beer occasionally    Drug use: No   Sexual activity: Not on file  Lifestyle   Physical activity:    Days per week: Not on file    Minutes per session: Not on file   Stress: Not on file  Relationships   Social connections:    Talks on phone: Not on file    Gets together: Not on file    Attends religious service: Not on file    Active member of club or organization: Not on file    Attends meetings of clubs or organizations: Not on file    Relationship status: Not on file   Intimate partner violence:    Fear of current or ex partner: Not on file    Emotionally abused: Not on file    Physically abused: Not on file    Forced sexual activity: Not on file  Other Topics Concern   Not on file  Social History Narrative   Not on file    FAMILY HISTORY:  Family History  Problem Relation Age of Onset   Hypertension Mother    Lung cancer Father    Lung cancer Brother    HIV/AIDS Brother    Breast cancer Daughter     CURRENT MEDICATIONS:  Outpatient Encounter Medications as of 08/06/2018  Medication Sig   abiraterone acetate (ZYTIGA) 250 MG tablet TAKE 4 TABLETS (1,000 MG TOTAL) BY MOUTH DAILY. TAKE ON AN EMPTY STOMACH 1 HOUR BEFORE OR 2 HOURS AFTER A MEAL   docusate sodium (COLACE) 100 MG capsule Take 100 mg by mouth daily.   lidocaine-prilocaine (EMLA) cream Apply to affected area once (Patient not taking: Reported on 08/06/2018)   predniSONE (DELTASONE) 5 MG tablet  Take 5 mg by mouth daily with breakfast.    prochlorperazine (COMPAZINE) 10 MG tablet Take 1 tablet (10 mg total) by mouth every 6 (six) hours as needed (Nausea or vomiting).   No facility-administered encounter medications on file as of 08/06/2018.     ALLERGIES:  No Known Allergies   PHYSICAL EXAM:  ECOG Performance status: 1  Vitals:   08/06/18 0916  BP: (!) 157/82  Pulse: 67  Resp: 14  Temp: 98.1 F (36.7 C)  SpO2: 100%   Filed Weights   08/06/18 0916  Weight: 174 lb 4.8 oz (79.1 kg)    Physical Exam Constitutional:      Appearance: Normal appearance. He is normal weight.  Cardiovascular:     Rate  and Rhythm: Normal rate and regular rhythm.     Heart sounds: Normal heart sounds.  Pulmonary:     Effort: Pulmonary effort is normal.     Breath sounds: Normal breath sounds.  Abdominal:     General: Bowel sounds are normal.     Palpations: Abdomen is soft.  Musculoskeletal: Normal range of motion.  Skin:    General: Skin is warm and dry.  Neurological:     Mental Status: He is alert and oriented to person, place, and time. Mental status is at baseline.  Psychiatric:        Mood and Affect: Mood normal.        Behavior: Behavior normal.        Thought Content: Thought content normal.        Judgment: Judgment normal.      LABORATORY DATA:  I have reviewed the labs as listed.  CBC    Component Value Date/Time   WBC 3.8 (L) 08/05/2018 0823   RBC 3.93 (L) 08/05/2018 0823   HGB 12.0 (L) 08/05/2018 0823   HCT 36.2 (L) 08/05/2018 0823   PLT 201 08/05/2018 0823   MCV 92.1 08/05/2018 0823   MCH 30.5 08/05/2018 0823   MCHC 33.1 08/05/2018 0823   RDW 14.0 08/05/2018 0823   LYMPHSABS 1.9 08/05/2018 0823   MONOABS 0.3 08/05/2018 0823   EOSABS 0.0 08/05/2018 0823   BASOSABS 0.0 08/05/2018 0823   CMP Latest Ref Rng & Units 08/05/2018 07/04/2018 06/10/2018  Glucose 70 - 99 mg/dL 100(H) 90 105(H)  BUN 8 - 23 mg/dL '22 22 20  ' Creatinine 0.61 - 1.24 mg/dL 1.53(H)  1.67(H) 1.58(H)  Sodium 135 - 145 mmol/L 139 140 140  Potassium 3.5 - 5.1 mmol/L 4.0 3.5 4.2  Chloride 98 - 111 mmol/L 106 106 108  CO2 22 - 32 mmol/L '26 26 25  ' Calcium 8.9 - 10.3 mg/dL 8.8(L) 9.0 8.6(L)  Total Protein 6.5 - 8.1 g/dL 7.3 6.8 6.8  Total Bilirubin 0.3 - 1.2 mg/dL 1.2 1.1 1.0  Alkaline Phos 38 - 126 U/L 58 55 47  AST 15 - 41 U/L '21 23 23  ' ALT 0 - 44 U/L '25 19 21    ' I personally performed a face-to-face visit. All questions were answered to patient's stated satisfaction. Encouraged patient to call with any new concerns or questions before his next visit to the cancer center and we can certain see him sooner, if needed.      ASSESSMENT & PLAN:   Malignant neoplasm of prostate ([place]) 1.  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 - He did report back pain and chest wall pain for 3 months prior which improved after orchiectomy. - He had a bone scan on 09/12/2017 which showed extensive abnormal uptake in the shoulders, sternum, ribs, throughout the spine, pelvis and proximal to mid femurs. - He had a CT of the abdomen and pelvis without contrast on 09/12/2017 which showed extensive retroperitoneal adenopathy, right hydronephrosis. - He underwent a prostate biopsy on 09/17/2017.  (Gleason 9) bilateral orchiectomy, cystoscopy with fulguration of bladder neck, and attempted right ureteral stent placement. - PSA on 09/24/2017 was 422 -he had 6 cycles of docetaxel chemotherapy from 11/19/2017 through 03/05/2018. -She had a repeat CT of abdomen and pelvis dated 03/25/2018 which showed interval reduction in size of pelvic lymph nodes which are now less than 10  mm in short axis.  No evidence of new adenopathy in the abdomen or pelvis.  Interval marked improvement in the right hydronephrosis.  Measurable volume reduction in the prostate gland. - PSA on 04/10/2018 was 4.52. -Bone scan dated  04/29/2018 showed widespread metastatic disease, mildly decrease in uptake with no new lesions. -Zytiga and prednisone 5 mg daily started on 05/07/2018.  He is tolerating this well.  Blood pressure is better and potassium is normal. - PSA on 06/10/2018 was down to 2.19. - We will continue 1042m of Zytiga daily and 5 mg of prednisone daily. - Labs done on 08/05/2018 showed PSA was down to 1.07.  Potassium was 4.0.  AST was 21 and ALT was 25.  All other labs were within normal limits. -We will see him back in 4 weeks with labs.  2.  Bone metastasis: - Last dose of Xgeva was on 07/04/2018. -Labs on 08/05/2018 showed calcium of 8.8. -He will get his dose of Xgeva today. -She continues to take her calcium supplements daily.  3.  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 -She has received intermittent Feraheme in the past. - Labs done on 07/04/2018 showed ferritin 233 and percent saturation of 24. -Labs done on 08/05/2018 showed hemoglobin up to 12.0 and platelets 201. - We do not recommend any replacement iron at this time. -We will recheck an iron panel in 3 months.      Orders placed this encounter:  Orders Placed This Encounter  Procedures   PSA   Lactate dehydrogenase   Magnesium   CBC with Differential/Platelet   Comprehensive metabolic panel   Phosphorus      RFrancene Finders FNP-C [phone]

## 2018-08-06 NOTE — Assessment & Plan Note (Addendum)
1.  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 - He did report back pain and chest wall pain for 3 months prior which improved after orchiectomy. - He had a bone scan on 09/12/2017 which showed extensive abnormal uptake in the shoulders, sternum, ribs, throughout the spine, pelvis and proximal to mid femurs. - He had a CT of the abdomen and pelvis without contrast on 09/12/2017 which showed extensive retroperitoneal adenopathy, right hydronephrosis. - He underwent a prostate biopsy on 09/17/2017.  (Gleason 9) bilateral orchiectomy, cystoscopy with fulguration of bladder neck, and attempted right ureteral stent placement. - PSA on 09/24/2017 was 422 -he had 6 cycles of docetaxel chemotherapy from 11/19/2017 through 03/05/2018. -She had a repeat CT of abdomen and pelvis dated 03/25/2018 which showed interval reduction in size of pelvic lymph nodes which are now less than 10 mm in short axis.  No evidence of new adenopathy in the abdomen or pelvis.  Interval marked improvement in the right hydronephrosis.  Measurable volume reduction in the prostate gland. - PSA on 04/10/2018 was 4.52. -Bone scan dated 04/29/2018 showed widespread metastatic disease, mildly decrease in uptake with no new lesions. -Zytiga and prednisone 5 mg daily started on 05/07/2018.  He is tolerating this well.  Blood pressure is better and potassium is normal. - PSA on 06/10/2018 was down to 2.19. - We will continue 1000mg  of Zytiga daily and 5 mg of prednisone daily. - Labs done on 08/05/2018 showed PSA was down to 1.07.  Potassium was 4.0.  AST was 21 and ALT was 25.  All other labs were within normal limits. -We will see him back in 4 weeks with labs.  2.  Bone metastasis: - Last dose of Xgeva was on 07/04/2018. -Labs on 08/05/2018 showed calcium of 8.8. -He will get his dose of Xgeva today. -She continues to take her calcium  supplements daily.  3.  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 -She has received intermittent Feraheme in the past. - Labs done on 07/04/2018 showed ferritin 233 and percent saturation of 24. -Labs done on 08/05/2018 showed hemoglobin up to 12.0 and platelets 201. - We do not recommend any replacement iron at this time. -We will recheck an iron panel in 3 months.

## 2018-08-06 NOTE — Progress Notes (Signed)
Patient seen by Francene Finders, NP, with lab review and ok to give xgeva today.

## 2018-08-06 NOTE — Patient Instructions (Addendum)
[place] at [facility]  Discharge Instructions: Follow up in 4 weeks with labs.   You saw Francene Finders NP today. _______________________________________________________________  Thank you for choosing [place] at [facility] to provide your oncology and hematology care.  To afford each patient quality time with our providers, please arrive at least 15 minutes before your scheduled appointment.  You need to re-schedule your appointment if you arrive 10 or more minutes late.  We strive to give you quality time with our providers, and arriving late affects you and other patients whose appointments are after yours.  Also, if you no show three or more times for appointments you may be dismissed from the clinic.  Again, thank you for choosing [place] at [facility] hope is that these requests will allow you access to exceptional care and in a timely manner. _______________________________________________________________  If you have questions after your visit, please contact our office at [phone] between the hours of 8:30 a.m. and 5:00 p.m. Voicemails left after 4:30 p.m. will not be returned until the following business day. _______________________________________________________________  For prescription refill requests, have your pharmacy contact our office. _______________________________________________________________  Recommendations made by the consultant and any test results will be sent to your referring physician. _______________________________________________________________

## 2018-08-20 ENCOUNTER — Other Ambulatory Visit (HOSPITAL_COMMUNITY): Payer: Self-pay | Admitting: Nurse Practitioner

## 2018-08-20 DIAGNOSIS — C61 Malignant neoplasm of prostate: Secondary | ICD-10-CM

## 2018-08-22 MED FILL — ABIRATERONE ACETATE 250 MG: 250 | 30 days supply | Qty: 120 | Fill #0

## 2018-09-03 ENCOUNTER — Other Ambulatory Visit: Payer: Self-pay

## 2018-09-03 ENCOUNTER — Inpatient Hospital Stay (HOSPITAL_COMMUNITY): Payer: Medicaid Other | Attending: Hematology

## 2018-09-03 DIAGNOSIS — Z79899 Other long term (current) drug therapy: Secondary | ICD-10-CM | POA: Insufficient documentation

## 2018-09-03 DIAGNOSIS — R9721 Rising PSA following treatment for malignant neoplasm of prostate: Secondary | ICD-10-CM | POA: Diagnosis not present

## 2018-09-03 DIAGNOSIS — C61 Malignant neoplasm of prostate: Secondary | ICD-10-CM | POA: Diagnosis present

## 2018-09-03 DIAGNOSIS — C779 Secondary and unspecified malignant neoplasm of lymph node, unspecified: Secondary | ICD-10-CM | POA: Insufficient documentation

## 2018-09-03 DIAGNOSIS — D631 Anemia in chronic kidney disease: Secondary | ICD-10-CM | POA: Diagnosis not present

## 2018-09-03 DIAGNOSIS — N189 Chronic kidney disease, unspecified: Secondary | ICD-10-CM | POA: Diagnosis not present

## 2018-09-03 DIAGNOSIS — E611 Iron deficiency: Secondary | ICD-10-CM | POA: Diagnosis not present

## 2018-09-03 DIAGNOSIS — C7951 Secondary malignant neoplasm of bone: Secondary | ICD-10-CM | POA: Diagnosis not present

## 2018-09-03 LAB — COMPREHENSIVE METABOLIC PANEL
ALT: 18 U/L (ref 0–44)
AST: 22 U/L (ref 15–41)
Albumin: 4.2 g/dL (ref 3.5–5.0)
Alkaline Phosphatase: 59 U/L (ref 38–126)
Anion gap: 12 (ref 5–15)
BUN: 22 mg/dL (ref 8–23)
CO2: 24 mmol/L (ref 22–32)
Calcium: 9.3 mg/dL (ref 8.9–10.3)
Chloride: 105 mmol/L (ref 98–111)
Creatinine, Ser: 1.56 mg/dL — ABNORMAL HIGH (ref 0.61–1.24)
GFR calc Af Amer: 49 mL/min — ABNORMAL LOW (ref >60)
GFR calc non Af Amer: 42 mL/min — ABNORMAL LOW (ref >60)
Glucose, Bld: 91 mg/dL (ref 70–99)
Potassium: 3.5 mmol/L (ref 3.5–5.1)
Sodium: 141 mmol/L (ref 135–145)
Total Bilirubin: 1 mg/dL (ref 0.3–1.2)
Total Protein: 7.5 g/dL (ref 6.5–8.1)

## 2018-09-03 LAB — CBC WITH DIFFERENTIAL/PLATELET
Abs Immature Granulocytes: 0.01 10*3/uL (ref 0.00–0.07)
Basophils Absolute: 0 10*3/uL (ref 0.0–0.1)
Basophils Relative: 1 %
Eosinophils Absolute: 0.1 10*3/uL (ref 0.0–0.5)
Eosinophils Relative: 2 %
HCT: 38.3 % — ABNORMAL LOW (ref 39.0–52.0)
Hemoglobin: 12.5 g/dL — ABNORMAL LOW (ref 13.0–17.0)
Immature Granulocytes: 0 %
Lymphocytes Relative: 42 %
Lymphs Abs: 1.7 10*3/uL (ref 0.7–4.0)
MCH: 30.8 pg (ref 26.0–34.0)
MCHC: 32.6 g/dL (ref 30.0–36.0)
MCV: 94.3 fL (ref 80.0–100.0)
Monocytes Absolute: 0.3 10*3/uL (ref 0.1–1.0)
Monocytes Relative: 8 %
Neutro Abs: 1.9 10*3/uL (ref 1.7–7.7)
Neutrophils Relative %: 47 %
Platelets: 225 10*3/uL (ref 150–400)
RBC: 4.06 MIL/uL — ABNORMAL LOW (ref 4.22–5.81)
RDW: 13.8 % (ref 11.5–15.5)
WBC: 4.1 10*3/uL (ref 4.0–10.5)
nRBC: 0 % (ref 0.0–0.2)

## 2018-09-03 LAB — MAGNESIUM: Magnesium: 2.2 mg/dL (ref 1.7–2.4)

## 2018-09-03 LAB — LACTATE DEHYDROGENASE: LDH: 180 U/L (ref 98–192)

## 2018-09-03 LAB — PSA: Prostatic Specific Antigen: 1.17 ng/mL (ref 0.00–4.00)

## 2018-09-03 LAB — PHOSPHORUS: Phosphorus: 2.7 mg/dL (ref 2.5–4.6)

## 2018-09-04 ENCOUNTER — Inpatient Hospital Stay (HOSPITAL_BASED_OUTPATIENT_CLINIC_OR_DEPARTMENT_OTHER): Payer: Medicaid Other | Admitting: Nurse Practitioner

## 2018-09-04 ENCOUNTER — Encounter (HOSPITAL_COMMUNITY): Payer: Self-pay | Admitting: Nurse Practitioner

## 2018-09-04 ENCOUNTER — Inpatient Hospital Stay (HOSPITAL_COMMUNITY): Payer: Medicaid Other

## 2018-09-04 DIAGNOSIS — C779 Secondary and unspecified malignant neoplasm of lymph node, unspecified: Secondary | ICD-10-CM

## 2018-09-04 DIAGNOSIS — C61 Malignant neoplasm of prostate: Secondary | ICD-10-CM

## 2018-09-04 DIAGNOSIS — C7951 Secondary malignant neoplasm of bone: Secondary | ICD-10-CM

## 2018-09-04 DIAGNOSIS — D631 Anemia in chronic kidney disease: Secondary | ICD-10-CM

## 2018-09-04 DIAGNOSIS — R972 Elevated prostate specific antigen [PSA]: Secondary | ICD-10-CM | POA: Diagnosis not present

## 2018-09-04 DIAGNOSIS — Z79899 Other long term (current) drug therapy: Secondary | ICD-10-CM

## 2018-09-04 DIAGNOSIS — N189 Chronic kidney disease, unspecified: Secondary | ICD-10-CM

## 2018-09-04 DIAGNOSIS — E611 Iron deficiency: Secondary | ICD-10-CM

## 2018-09-04 MED ORDER — DENOSUMAB 120 MG/1.7ML ~~LOC~~ SOLN
SUBCUTANEOUS | Status: AC
  Filled 2018-09-04: qty 1.7

## 2018-09-04 MED ORDER — DENOSUMAB 120 MG/1.7ML ~~LOC~~ SOLN
120.0000 mg | Freq: Once | SUBCUTANEOUS | Status: AC
  Administered 2018-09-04: 120 mg via SUBCUTANEOUS

## 2018-09-04 NOTE — Assessment & Plan Note (Addendum)
1.  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 - He did report back pain and chest wall pain for 3 months prior which improved after orchiectomy. - He had a bone scan on 09/12/2017 which showed extensive abnormal uptake in the shoulders, sternum, ribs, throughout the spine, pelvis and proximal to mid femurs. - He had a CT of the abdomen and pelvis without contrast on 09/12/2017 which showed extensive retroperitoneal adenopathy, right hydronephrosis. - He underwent a prostate biopsy on 09/17/2017.  (Gleason 9) bilateral orchiectomy, cystoscopy with fulguration of bladder neck, and attempted right ureteral stent placement. - PSA on 09/24/2017 was 422 -he had 6 cycles of docetaxel chemotherapy from 11/19/2017 through 03/05/2018. -She had a repeat CT of abdomen and pelvis dated 03/25/2018 which showed interval reduction in size of pelvic lymph nodes which are now less than 10 mm in short axis.  No evidence of new adenopathy in the abdomen or pelvis.  Interval marked improvement in the right hydronephrosis.  Measurable volume reduction in the prostate gland. - PSA on 04/10/2018 was 4.52. -Bone scan dated 04/29/2018 showed widespread metastatic disease, mildly decrease in uptake with no new lesions. -Zytiga and prednisone 5 mg daily started on 05/07/2018.  He is tolerating this well.  Blood pressure is better and potassium is normal. - We will continue 1000mg  of Zytiga daily and 5 mg of prednisone daily. - Labs done on 09/03/2018 showed PSA was slightly higher at 1.17 previously it was 1.07, potassium was 3.5, AST was 22 and ALT was 18.  All other labs were within normal limits. -We will see him back in 4 weeks with labs.  2.  Bone metastasis: - Last dose of Xgeva was on 08/06/2018 -Labs on 09/03/2018 showed calcium of 9.3. -He will get his dose of Xgeva today. -She continues to take her calcium supplements  daily.  3.  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 -She has received intermittent Feraheme in the past. - Labs done on 07/04/2018 showed ferritin 233 and percent saturation of 24. -Labs done on 09/03/2018 showed hemoglobin up to 12.5 and platelets 225. - We do not recommend any replacement iron at this time. -We will recheck an iron panel in 1 month.

## 2018-09-04 NOTE — Patient Instructions (Signed)
[facility] at [facility] Discharge Instructions  Follow up in 4 weeks with labs    Thank you for choosing [facility] at [facility] to provide your oncology and hematology care.  To afford each patient quality time with our provider, please arrive at least 15 minutes before your scheduled appointment time.   If you have a lab appointment with the Cancer Center please come in thru the  Main Entrance and check in at the main information desk  You need to re-schedule your appointment should you arrive 10 or more minutes late.  We strive to give you quality time with our providers, and arriving late affects you and other patients whose appointments are after yours.  Also, if you no show three or more times for appointments you may be dismissed from the clinic at the providers discretion.     Again, thank you for choosing [facility].  Our hope is that these requests will decrease the amount of time that you wait before being seen by our physicians.       _____________________________________________________________  Should you have questions after your visit to [facility], please contact our office at [phone] between the hours of 8:00 a.m. and 4:30 p.m.  Voicemails left after 4:00 p.m. will not be returned until the following business day.  For prescription refill requests, have your pharmacy contact our office and allow 72 hours.    Cancer Center Support Programs:   > Cancer Support Group  2nd Tuesday of the month 1pm-2pm, Journey Room    

## 2018-09-04 NOTE — Patient Instructions (Signed)
[facility] at [facility]  Discharge Instructions:  Xgeva injection received today. _______________________________________________________________  Thank you for choosing [facility] at [facility] to provide your oncology and hematology care.  To afford each patient quality time with our providers, please arrive at least 15 minutes before your scheduled appointment.  You need to re-schedule your appointment if you arrive 10 or more minutes late.  We strive to give you quality time with our providers, and arriving late affects you and other patients whose appointments are after yours.  Also, if you no show three or more times for appointments you may be dismissed from the clinic.  Again, thank you for choosing [facility] at [facility]. Our hope is that these requests will allow you access to exceptional care and in a timely manner. _______________________________________________________________  If you have questions after your visit, please contact our office at [phone] between the hours of 8:30 a.m. and 5:00 p.m. Voicemails left after 4:30 p.m. will not be returned until the following business day. _______________________________________________________________  For prescription refill requests, have your pharmacy contact our office. _______________________________________________________________  Recommendations made by the consultant and any test results will be sent to your referring physician. _______________________________________________________________ 

## 2018-09-04 NOTE — Progress Notes (Signed)
Lab work reviewed and patient seen by Francene Finders, NP. Per Lala Lund, ok to proceed with Xgeva injection today. Ca 9.3. Pt denies jaw or tooth pain. He states he is taking his Ca and Vit D as instructed. No recent or future dental procedures planned.   Clarise Cruz tolerated Xgeva injection without incident or complaint. VSS. Discharged self ambulatory in satisfactory condition.
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
## 2018-09-13 ENCOUNTER — Other Ambulatory Visit (HOSPITAL_COMMUNITY): Payer: Self-pay | Admitting: Nurse Practitioner

## 2018-09-13 DIAGNOSIS — C61 Malignant neoplasm of prostate: Secondary | ICD-10-CM

## 2018-10-01 MED FILL — ABIRATERONE ACETATE 250 MG: 250 | 30 days supply | Qty: 120 | Fill #0

## 2018-10-02 ENCOUNTER — Inpatient Hospital Stay (HOSPITAL_BASED_OUTPATIENT_CLINIC_OR_DEPARTMENT_OTHER): Payer: Medicaid Other | Admitting: Nurse Practitioner

## 2018-10-02 ENCOUNTER — Inpatient Hospital Stay (HOSPITAL_COMMUNITY): Payer: Medicaid Other

## 2018-10-02 ENCOUNTER — Other Ambulatory Visit: Payer: Self-pay

## 2018-10-02 ENCOUNTER — Inpatient Hospital Stay (HOSPITAL_COMMUNITY): Payer: Medicaid Other | Attending: Hematology

## 2018-10-02 DIAGNOSIS — D631 Anemia in chronic kidney disease: Secondary | ICD-10-CM | POA: Insufficient documentation

## 2018-10-02 DIAGNOSIS — C61 Malignant neoplasm of prostate: Secondary | ICD-10-CM

## 2018-10-02 DIAGNOSIS — C7951 Secondary malignant neoplasm of bone: Secondary | ICD-10-CM | POA: Insufficient documentation

## 2018-10-02 DIAGNOSIS — N189 Chronic kidney disease, unspecified: Secondary | ICD-10-CM

## 2018-10-02 LAB — COMPREHENSIVE METABOLIC PANEL
ALT: 14 U/L (ref 0–44)
AST: 19 U/L (ref 15–41)
Albumin: 4.1 g/dL (ref 3.5–5.0)
Alkaline Phosphatase: 50 U/L (ref 38–126)
Anion gap: 8 (ref 5–15)
BUN: 24 mg/dL — ABNORMAL HIGH (ref 8–23)
CO2: 27 mmol/L (ref 22–32)
Calcium: 9.4 mg/dL (ref 8.9–10.3)
Chloride: 106 mmol/L (ref 98–111)
Creatinine, Ser: 1.69 mg/dL — ABNORMAL HIGH (ref 0.61–1.24)
GFR calc Af Amer: 44 mL/min — ABNORMAL LOW (ref >60)
GFR calc non Af Amer: 38 mL/min — ABNORMAL LOW (ref >60)
Glucose, Bld: 84 mg/dL (ref 70–99)
Potassium: 3.9 mmol/L (ref 3.5–5.1)
Sodium: 141 mmol/L (ref 135–145)
Total Bilirubin: 1.2 mg/dL (ref 0.3–1.2)
Total Protein: 7.2 g/dL (ref 6.5–8.1)

## 2018-10-02 LAB — CBC WITH DIFFERENTIAL/PLATELET
Abs Immature Granulocytes: 0.01 10*3/uL (ref 0.00–0.07)
Basophils Absolute: 0 10*3/uL (ref 0.0–0.1)
Basophils Relative: 1 %
Eosinophils Absolute: 0.1 10*3/uL (ref 0.0–0.5)
Eosinophils Relative: 3 %
HCT: 35.4 % — ABNORMAL LOW (ref 39.0–52.0)
Hemoglobin: 11.7 g/dL — ABNORMAL LOW (ref 13.0–17.0)
Immature Granulocytes: 0 %
Lymphocytes Relative: 42 %
Lymphs Abs: 1.8 10*3/uL (ref 0.7–4.0)
MCH: 31 pg (ref 26.0–34.0)
MCHC: 33.1 g/dL (ref 30.0–36.0)
MCV: 93.7 fL (ref 80.0–100.0)
Monocytes Absolute: 0.4 10*3/uL (ref 0.1–1.0)
Monocytes Relative: 9 %
Neutro Abs: 2 10*3/uL (ref 1.7–7.7)
Neutrophils Relative %: 45 %
Platelets: 205 10*3/uL (ref 150–400)
RBC: 3.78 MIL/uL — ABNORMAL LOW (ref 4.22–5.81)
RDW: 13.5 % (ref 11.5–15.5)
WBC: 4.3 10*3/uL (ref 4.0–10.5)
nRBC: 0 % (ref 0.0–0.2)

## 2018-10-02 LAB — FOLATE: Folate: 17.6 ng/mL (ref >5.9)

## 2018-10-02 LAB — IRON AND TIBC
Iron: 71 ug/dL (ref 45–182)
Saturation Ratios: 22 % (ref 17.9–39.5)
TIBC: 323 ug/dL (ref 250–450)
UIBC: 252 ug/dL

## 2018-10-02 LAB — PSA: Prostatic Specific Antigen: 1.11 ng/mL (ref 0.00–4.00)

## 2018-10-02 LAB — FERRITIN: Ferritin: 245 ng/mL (ref 24–336)

## 2018-10-02 LAB — LACTATE DEHYDROGENASE: LDH: 162 U/L (ref 98–192)

## 2018-10-02 LAB — VITAMIN B12: Vitamin B-12: 276 pg/mL (ref 180–914)

## 2018-10-02 MED ORDER — DENOSUMAB 120 MG/1.7ML ~~LOC~~ SOLN
120.0000 mg | Freq: Once | SUBCUTANEOUS | Status: AC
  Administered 2018-10-02: 11:00:00 120 mg via SUBCUTANEOUS

## 2018-10-02 MED ORDER — DENOSUMAB 120 MG/1.7ML ~~LOC~~ SOLN
SUBCUTANEOUS | Status: AC
  Filled 2018-10-02: qty 1.7

## 2018-10-02 NOTE — Assessment & Plan Note (Addendum)
1.  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 - He did report back pain and chest wall pain for 3 months prior which improved after orchiectomy. - He had a bone scan on 09/12/2017 which showed extensive abnormal uptake in the shoulders, sternum, ribs, throughout the spine, pelvis and proximal to mid femurs. - He had a CT of the abdomen and pelvis without contrast on 09/12/2017 which showed extensive retroperitoneal adenopathy, right hydronephrosis. - He underwent a prostate biopsy on 09/17/2017.  (Gleason 9) bilateral orchiectomy, cystoscopy with fulguration of bladder neck, and attempted right ureteral stent placement. - PSA on 09/24/2017 was 422 -he had 6 cycles of docetaxel chemotherapy from 11/19/2017 through 03/05/2018. -She had a repeat CT of abdomen and pelvis dated 03/25/2018 which showed interval reduction in size of pelvic lymph nodes which are now less than 10 mm in short axis.  No evidence of new adenopathy in the abdomen or pelvis.  Interval marked improvement in the right hydronephrosis.  Measurable volume reduction in the prostate gland. - PSA on 04/10/2018 was 4.52. -Bone scan dated 04/29/2018 showed widespread metastatic disease, mildly decrease in uptake with no new lesions. -Zytiga and prednisone 5 mg daily started on 05/07/2018.  He is tolerating this well.  Blood pressure is better and potassium is normal. - We will continue 1000mg  of Zytiga daily and 5 mg of prednisone daily. - Labs done on 09/03/2018 showed PSA was slightly higher at 1.17 previously it was 1.07.PSA for today is pending. -Labs on 10/02/2018 showed his potassium 3.9, creatinine 1.69, AST 19 ALT 14, LDH 162, WBC 4.3, hemoglobin 11.7, platelets 205. -We will see him back in 1 month with labs.  2.  Bone metastasis: - Last dose of Xgeva was on 09/05/2018 -Labs on 10/02/2018 showed his calcium of 9.4 -He will get his dose  of Xgeva today. -She continues to take her calcium supplements daily.  3.  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 -She has received intermittent Feraheme in the past. - Labs done on 07/04/2018 showed ferritin 233 and percent saturation of 24. -Labs done on 09/03/2018 showed hemoglobin up to 12.5 and platelets 225. -Labs done on 10/02/2018 showed his hemoglobin 11.7, ferritin 245, percent saturation 22, platelets 205 - We do not recommend any replacement iron at this time. -We will recheck an iron panel in 1 month.

## 2018-10-02 NOTE — Patient Instructions (Signed)
[facility] at [facility] Discharge Instructions  Follow up in 4 weeks with labs    Thank you for choosing [facility] at [facility] to provide your oncology and hematology care.  To afford each patient quality time with our provider, please arrive at least 15 minutes before your scheduled appointment time.   If you have a lab appointment with the Cancer Center please come in thru the  Main Entrance and check in at the main information desk  You need to re-schedule your appointment should you arrive 10 or more minutes late.  We strive to give you quality time with our providers, and arriving late affects you and other patients whose appointments are after yours.  Also, if you no show three or more times for appointments you may be dismissed from the clinic at the providers discretion.     Again, thank you for choosing [facility].  Our hope is that these requests will decrease the amount of time that you wait before being seen by our physicians.       _____________________________________________________________  Should you have questions after your visit to  Cancer Center, please contact our office at [phone] between the hours of 8:00 a.m. and 4:30 p.m.  Voicemails left after 4:00 p.m. will not be returned until the following business day.  For prescription refill requests, have your pharmacy contact our office and allow 72 hours.    Cancer Center Support Programs:   > Cancer Support Group  2nd Tuesday of the month 1pm-2pm, Journey Room    
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
## 2018-10-02 NOTE — Progress Notes (Signed)
Carlos Harper tolerated Xgeva injection well without complaints or incident. Calcium 9.4 today and pt denied any tooth or jaw pain and no recent or future dental visits prior to administering this medication.Pt discharged self ambulatory in satisfactory condition

## 2018-10-02 NOTE — Patient Instructions (Signed)
[facility] Discharge Instructions  Received Xgeva injection today. Follow-up as scheduled. Call clinic for any questions or concerns   Thank you for choosing  Can[facility] Center at [facility] to provide your oncology and hematology care.  To afford each patient quality time with our provider, please arrive at least 15 minutes before your scheduled appointment time.   If you have a lab appointment with the Cancer Center please come in thru the  Main Entrance and check in at the main information desk  You need to re-schedule your appointment should you arrive 10 or more minutes late.  We strive to give you quality time with our providers, and arriving late affects you and other patients whose appointments are after yours.  Also, if you no show three or more times for appointments you may be dismissed from the clinic at the providers discretion.     Again, thank you for choosing [facility].  Our hope is that these requests will decrease the amount of time that you wait before being seen by our physicians.       _____________________________________________________________  Should you have questions after your visit to [facility], please contact our office at [phone] between the hours of 8:00 a.m. and 4:30 p.m.  Voicemails left after 4:00 p.m. will not be returned until the following business day.  For prescription refill requests, have your pharmacy contact our office and allow 72 hours.    Cancer Center Support Programs:   > Cancer Support Group  2nd Tuesday of the month 1pm-2pm, Journey Room   
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
## 2018-10-03 LAB — VITAMIN D 25 HYDROXY (VIT D DEFICIENCY, FRACTURES): Vit D, 25-Hydroxy: 27 ng/mL — ABNORMAL LOW (ref 30.0–100.0)

## 2018-10-27 ENCOUNTER — Other Ambulatory Visit (HOSPITAL_COMMUNITY): Payer: Self-pay | Admitting: Nurse Practitioner

## 2018-10-30 ENCOUNTER — Inpatient Hospital Stay (HOSPITAL_COMMUNITY): Payer: Medicaid Other

## 2018-10-30 ENCOUNTER — Other Ambulatory Visit: Payer: Self-pay

## 2018-10-30 ENCOUNTER — Inpatient Hospital Stay (HOSPITAL_BASED_OUTPATIENT_CLINIC_OR_DEPARTMENT_OTHER): Payer: Medicaid Other | Admitting: Nurse Practitioner

## 2018-10-30 ENCOUNTER — Other Ambulatory Visit (HOSPITAL_COMMUNITY): Payer: Self-pay | Admitting: Nurse Practitioner

## 2018-10-30 ENCOUNTER — Inpatient Hospital Stay (HOSPITAL_COMMUNITY): Payer: Medicaid Other | Attending: Hematology

## 2018-10-30 DIAGNOSIS — D509 Iron deficiency anemia, unspecified: Secondary | ICD-10-CM | POA: Insufficient documentation

## 2018-10-30 DIAGNOSIS — C61 Malignant neoplasm of prostate: Secondary | ICD-10-CM

## 2018-10-30 DIAGNOSIS — C7951 Secondary malignant neoplasm of bone: Secondary | ICD-10-CM

## 2018-10-30 DIAGNOSIS — D631 Anemia in chronic kidney disease: Secondary | ICD-10-CM | POA: Diagnosis not present

## 2018-10-30 DIAGNOSIS — N189 Chronic kidney disease, unspecified: Secondary | ICD-10-CM | POA: Diagnosis not present

## 2018-10-30 LAB — COMPREHENSIVE METABOLIC PANEL
ALT: 15 U/L (ref 0–44)
AST: 19 U/L (ref 15–41)
Albumin: 3.8 g/dL (ref 3.5–5.0)
Alkaline Phosphatase: 45 U/L (ref 38–126)
Anion gap: 8 (ref 5–15)
BUN: 26 mg/dL — ABNORMAL HIGH (ref 8–23)
CO2: 24 mmol/L (ref 22–32)
Calcium: 8.7 mg/dL — ABNORMAL LOW (ref 8.9–10.3)
Chloride: 108 mmol/L (ref 98–111)
Creatinine, Ser: 1.49 mg/dL — ABNORMAL HIGH (ref 0.61–1.24)
GFR calc Af Amer: 51 mL/min — ABNORMAL LOW (ref >60)
GFR calc non Af Amer: 44 mL/min — ABNORMAL LOW (ref >60)
Glucose, Bld: 88 mg/dL (ref 70–99)
Potassium: 4.1 mmol/L (ref 3.5–5.1)
Sodium: 140 mmol/L (ref 135–145)
Total Bilirubin: 1.2 mg/dL (ref 0.3–1.2)
Total Protein: 6.6 g/dL (ref 6.5–8.1)

## 2018-10-30 LAB — CBC WITH DIFFERENTIAL/PLATELET
Abs Immature Granulocytes: 0 10*3/uL (ref 0.00–0.07)
Basophils Absolute: 0 10*3/uL (ref 0.0–0.1)
Basophils Relative: 1 %
Eosinophils Absolute: 0.1 10*3/uL (ref 0.0–0.5)
Eosinophils Relative: 2 %
HCT: 34 % — ABNORMAL LOW (ref 39.0–52.0)
Hemoglobin: 11.3 g/dL — ABNORMAL LOW (ref 13.0–17.0)
Immature Granulocytes: 0 %
Lymphocytes Relative: 43 %
Lymphs Abs: 1.6 10*3/uL (ref 0.7–4.0)
MCH: 31.3 pg (ref 26.0–34.0)
MCHC: 33.2 g/dL (ref 30.0–36.0)
MCV: 94.2 fL (ref 80.0–100.0)
Monocytes Absolute: 0.4 10*3/uL (ref 0.1–1.0)
Monocytes Relative: 10 %
Neutro Abs: 1.7 10*3/uL (ref 1.7–7.7)
Neutrophils Relative %: 44 %
Platelets: 174 10*3/uL (ref 150–400)
RBC: 3.61 MIL/uL — ABNORMAL LOW (ref 4.22–5.81)
RDW: 13.2 % (ref 11.5–15.5)
WBC: 3.8 10*3/uL — ABNORMAL LOW (ref 4.0–10.5)
nRBC: 0 % (ref 0.0–0.2)

## 2018-10-30 LAB — IRON AND TIBC
Iron: 51 ug/dL (ref 45–182)
Saturation Ratios: 19 % (ref 17.9–39.5)
TIBC: 270 ug/dL (ref 250–450)
UIBC: 219 ug/dL

## 2018-10-30 LAB — FERRITIN: Ferritin: 276 ng/mL (ref 24–336)

## 2018-10-30 LAB — PSA: Prostatic Specific Antigen: 0.86 ng/mL (ref 0.00–4.00)

## 2018-10-30 LAB — LACTATE DEHYDROGENASE: LDH: 159 U/L (ref 98–192)

## 2018-10-30 MED ORDER — DENOSUMAB 120 MG/1.7ML ~~LOC~~ SOLN
120.0000 mg | Freq: Once | SUBCUTANEOUS | Status: AC
  Administered 2018-10-30: 120 mg via SUBCUTANEOUS
  Filled 2018-10-30: qty 1.7

## 2018-10-30 NOTE — Patient Instructions (Addendum)
[place] at [facility] Discharge Instructions   Follow up in 2 months for office visit and labs   Thank you for choosing [place] at [facility] to provide your oncology and hematology care.  To afford each patient quality time with our provider, please arrive at least 15 minutes before your scheduled appointment time.   If you have a lab appointment with the [facility] please come in thru the  Main Entrance and check in at the main information desk  You need to re-schedule your appointment should you arrive 10 or more minutes late.  We strive to give you quality time with our providers, and arriving late affects you and other patients whose appointments are after yours.  Also, if you no show three or more times for appointments you may be dismissed from the clinic at the providers discretion.     Again, thank you for choosing [facility].  Our hope is that these requests will decrease the amount of time that you wait before being seen by our physicians.       _____________________________________________________________  Should you have questions after your visit to [facility], please contact our office at [phone] between the hours of 8:00 a.m. and 4:30 p.m.  Voicemails left after 4:00 p.m. will not be returned until the following business day.  For prescription refill requests, have your pharmacy contact our office and allow 72 hours.    Cancer Center Support Programs:   > Cancer Support Group  2nd Tuesday of the month 1pm-2pm, Journey Room

## 2018-10-30 NOTE — Assessment & Plan Note (Addendum)
1.  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 - He did report back pain and chest wall pain for 3 months prior which improved after orchiectomy. - He had a bone scan on 09/12/2017 which showed extensive abnormal uptake in the shoulders, sternum, ribs, throughout the spine, pelvis and proximal to mid femurs. - He had a CT of the abdomen and pelvis without contrast on 09/12/2017 which showed extensive retroperitoneal adenopathy, right hydronephrosis. - He underwent a prostate biopsy on 09/17/2017.  (Gleason 9) bilateral orchiectomy, cystoscopy with fulguration of bladder neck, and attempted right ureteral stent placement. - PSA on 09/24/2017 was 422 -he had 6 cycles of docetaxel chemotherapy from 11/19/2017 through 03/05/2018. -She had a repeat CT of abdomen and pelvis dated 03/25/2018 which showed interval reduction in size of pelvic lymph nodes which are now less than 10 mm in short axis.  No evidence of new adenopathy in the abdomen or pelvis.  Interval marked improvement in the right hydronephrosis.  Measurable volume reduction in the prostate gland. - PSA on 04/10/2018 was 4.52. -Bone scan dated 04/29/2018 showed widespread metastatic disease, mildly decrease in uptake with no new lesions. -Zytiga and prednisone 5 mg daily started on 05/07/2018.  He is tolerating this well.  Blood pressure is better and potassium is normal. - We will continue 1000mg  of Zytiga daily and 5 mg of prednisone daily. - PSA on 09/03/2018 was 1.17, PSA on 10/02/2018 it was 1.11. PSA today is pending.  -Labs on 10/30/2018 showed his potassium 4.1, creatinine 1.49, AST 19 ALT 15, LDH 159, WBC 3.8, hemoglobin 11.3, platelets 174. -We will see him back in 2 month with labs.  2.  Bone metastasis: - Last dose of Xgeva was on 10/02/2018 -Labs on 10/30/2018 showed his calcium of 8.7. -He will get his dose of Xgeva today. -She continues  to take her calcium supplements daily. -He will continue to come monthly for his Xgeva injection and labs.  3.  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 -She has received intermittent Feraheme in the past. -Labs done on 09/03/2018 showed hemoglobin up to 12.5 and platelets 225. -Labs done on 10/02/2018 showed his hemoglobin 11.7, ferritin 245, percent saturation 22, platelets 205 -Labs done on 10/30/2018 showed his hemoglobin 11.3, ferritin 276, percent saturation 19, platelets 174. - We do not recommend any replacement iron at this time. -We will recheck an iron panel in 2 month.

## 2018-10-30 NOTE — Progress Notes (Signed)
.  Carlos Harper presents today for injection per the provider's orders. Xgeva administration without incident; see MAR for injection details.  Patient tolerated procedure well and without incident.  No questions or complaints noted at this time.

## 2018-10-30 NOTE — Progress Notes (Signed)
[place], [place] [address]   CLINIC:  Medical Oncology/Hematology  PCP:  Raiford Simmonds., PA-C [address] [place] [address] [phone]   REASON FOR VISIT: Follow-up for prostate cancer  CURRENT THERAPY: Zytiga and prednisone  BRIEF ONCOLOGIC HISTORY:  Oncology History  Malignant neoplasm of prostate ([place])  09/27/2017 Initial Diagnosis   Malignant neoplasm of prostate ([place])   10/31/2017 -  Chemotherapy   The patient had pegfilgrastim (NEULASTA) injection 6 mg, 6 mg, Subcutaneous, Once, 6 of 6 cycles Administration: 6 mg (11/21/2017), 6 mg (12/12/2017), 6 mg (01/02/2018), 6 mg (01/24/2018), 6 mg (02/14/2018), 6 mg (03/07/2018) pegfilgrastim (NEULASTA ONPRO KIT) injection 6 mg, 6 mg, Subcutaneous, Once, 2 of 2 cycles DOCEtaxel (TAXOTERE) 140 mg in sodium chloride 0.9 % 250 mL chemo infusion, 75 mg/m2 = 140 mg, Intravenous,  Once, 6 of 6 cycles Administration: 140 mg (11/19/2017), 140 mg (12/10/2017), 140 mg (01/22/2018), 140 mg (12/31/2017), 140 mg (02/12/2018), 140 mg (03/05/2018) ondansetron (ZOFRAN) 8 mg, dexamethasone (DECADRON) 10 mg in sodium chloride 0.9 % 50 mL IVPB, , Intravenous,  Once, 4 of 4 cycles Administration:  (12/31/2017),  (01/22/2018),  (02/12/2018),  (03/05/2018)  for chemotherapy treatment.      INTERVAL HISTORY:  Mr. Cavell 78 y.o. male returns for routine follow-up for prostate cancer.  He reports he has been feeling well since his last visit.  He is eating well and denies any fatigue. Denies any nausea, vomiting, or diarrhea. Denies any new pains. Had not noticed any recent bleeding such as epistaxis, hematuria or hematochezia. Denies recent chest pain on exertion, shortness of breath on minimal exertion, pre-syncopal episodes, or palpitations. Denies any numbness or tingling in hands or feet. Denies any recent fevers, infections, or recent hospitalizations. Patient reports appetite at 100% and energy level at 75%.  He is eating well and maintaining his weight at this time.     REVIEW OF SYSTEMS:  Review of Systems  All other systems reviewed and are negative.    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   Sickle cell trait ([place])    Vertigo    Past Surgical History:  Procedure Laterality Date   CYSTOSCOPY WITH FULGERATION  09/17/2017   Procedure: CYSTOSCOPY WITH FULGERATION OF BLADDER NECK;  Surgeon: [name], MD;  Location: [facility];  Service: Urology;;   CYSTOSCOPY WITH STENT PLACEMENT Right 09/17/2017   Procedure: CYSTOSCOPY WITH RIGHT RETROGRADE PYELOGRAM ATTEMPTED STENT PLACEMENT;  Surgeon: [name], MD;  Location: [facility];  Service: Urology;  Laterality: Right;   left leg surgery due to [place] Bilateral 09/17/2017   Procedure: ORCHIECTOMY;  Surgeon: [name], MD;  Location: [facility];  Service: Urology;  Laterality: Bilateral;   PORTACATH PLACEMENT Left 10/14/2017   Procedure: INSERTION PORT-A-CATH;  Surgeon: Aviva Signs, MD;  Location: [facility];  Service: General;  Laterality: Left;   PROSTATE BIOPSY N/A 09/17/2017   Procedure: BIOPSY TRANSRECTAL ULTRASONIC PROSTATE (TUBP);  Surgeon: [name], MD;  Location: [facility];  Service: Urology;  Laterality: N/A;     SOCIAL HISTORY:  Social History   Socioeconomic History   Marital status: Single    Spouse name: Not on file   Number of children: Not on file   Years of education: Not on file   Highest education  level: Not on file  Occupational History   Not on file  Social Needs   Financial resource strain: Not on file   Food insecurity    Worry: Not on file    Inability: Not on file   Transportation needs    Medical: Not on file    Non-medical: Not on file  Tobacco Use   Smoking status: Never Smoker   Smokeless tobacco: Never Used  Substance and Sexual Activity    Alcohol use: Yes    Comment: Drinks beer occasionally    Drug use: No   Sexual activity: Not on file  Lifestyle   Physical activity    Days per week: Not on file    Minutes per session: Not on file   Stress: Not on file  Relationships   Social connections    Talks on phone: Not on file    Gets together: Not on file    Attends religious service: Not on file    Active member of club or organization: Not on file    Attends meetings of clubs or organizations: Not on file    Relationship status: Not on file   Intimate partner violence    Fear of current or ex partner: Not on file    Emotionally abused: Not on file    Physically abused: Not on file    Forced sexual activity: Not on file  Other Topics Concern   Not on file  Social History Narrative   Not on file    FAMILY HISTORY:  Family History  Problem Relation Age of Onset   Hypertension Mother    Lung cancer Father    Lung cancer Brother    HIV/AIDS Brother    Breast cancer Daughter     CURRENT MEDICATIONS:  Outpatient Encounter Medications as of 10/30/2018  Medication Sig   abiraterone acetate (ZYTIGA) 250 MG tablet TAKE 4 TABLETS (1,000 MG TOTAL) BY MOUTH DAILY. TAKE ON AN EMPTY STOMACH 1 HOUR BEFORE OR 2 HOURS AFTER A MEAL   lidocaine-prilocaine (EMLA) cream Apply to affected area once   predniSONE (DELTASONE) 5 MG tablet Take 1 tablet by mouth once daily with breakfast   docusate sodium (COLACE) 100 MG capsule Take 100 mg by mouth daily.   prochlorperazine (COMPAZINE) 10 MG tablet Take 1 tablet (10 mg total) by mouth every 6 (six) hours as needed (Nausea or vomiting). (Patient not taking: Reported on 10/30/2018)   No facility-administered encounter medications on file as of 10/30/2018.     ALLERGIES:  No Known Allergies   PHYSICAL EXAM:  ECOG Performance status: 1  Vitals:   10/30/18 1000  BP: (!) 145/78  Pulse: 66  Resp: 16  Temp: (!) 97.3 F (36.3 C)  SpO2: 100%   Filed Weights    10/30/18 1000  Weight: 173 lb 5 oz (78.6 kg)    Physical Exam Constitutional:      Appearance: Normal appearance. He is normal weight.  Cardiovascular:     Rate and Rhythm: Normal rate and regular rhythm.     Heart sounds: Normal heart sounds.  Pulmonary:     Effort: Pulmonary effort is normal.     Breath sounds: Normal breath sounds.  Abdominal:     General: Bowel sounds are normal.     Palpations: Abdomen is soft.  Musculoskeletal: Normal range of motion.  Skin:    General: Skin is warm and dry.  Neurological:     Mental Status: He is alert and oriented to  person, place, and time. Mental status is at baseline.  Psychiatric:        Mood and Affect: Mood normal.        Behavior: Behavior normal.        Thought Content: Thought content normal.        Judgment: Judgment normal.      LABORATORY DATA:  I have reviewed the labs as listed.  CBC    Component Value Date/Time   WBC 3.8 (L) 10/30/2018 0932   RBC 3.61 (L) 10/30/2018 0932   HGB 11.3 (L) 10/30/2018 0932   HCT 34.0 (L) 10/30/2018 0932   PLT 174 10/30/2018 0932   MCV 94.2 10/30/2018 0932   MCH 31.3 10/30/2018 0932   MCHC 33.2 10/30/2018 0932   RDW 13.2 10/30/2018 0932   LYMPHSABS 1.6 10/30/2018 0932   MONOABS 0.4 10/30/2018 0932   EOSABS 0.1 10/30/2018 0932   BASOSABS 0.0 10/30/2018 0932   CMP Latest Ref Rng & Units 10/30/2018 10/02/2018 09/03/2018  Glucose 70 - 99 mg/dL 88 84 91  BUN 8 - 23 mg/dL 26(H) 24(H) 22  Creatinine 0.61 - 1.24 mg/dL 1.49(H) 1.69(H) 1.56(H)  Sodium 135 - 145 mmol/L 140 141 141  Potassium 3.5 - 5.1 mmol/L 4.1 3.9 3.5  Chloride 98 - 111 mmol/L 108 106 105  CO2 22 - 32 mmol/L _0 Calcium 8.9 - 10.3 mg/dL 8.7(L) 9.4 9.3  Total Protein 6.5 - 8.1 g/dL 6.6 7.2 7.5  Total Bilirubin 0.3 - 1.2 mg/dL 1.2 1.2 1.0  Alkaline Phos 38 - 126 U/L 45 50 59  AST 15 - 41 U/L _1 ALT 0 - 44 U/L _2 I personally performed a face-to-face visit.  All questions were answered to  patient's stated satisfaction. Encouraged patient to call with any new concerns or questions before his next visit to the cancer center and we can certain see him sooner, if needed.     ASSESSMENT & PLAN:   Malignant neoplasm of prostate ([place]) 1.  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 - He did report back pain and chest wall pain for 3 months prior which improved after orchiectomy. - He had a bone scan on 09/12/2017 which showed extensive abnormal uptake in the shoulders, sternum, ribs, throughout the spine, pelvis and proximal to mid femurs. - He had a CT of the abdomen and pelvis without contrast on 09/12/2017 which showed extensive retroperitoneal adenopathy, right hydronephrosis. - He underwent a prostate biopsy on 09/17/2017.  (Gleason 9) bilateral orchiectomy, cystoscopy with fulguration of bladder neck, and attempted right ureteral stent placement. - PSA on 09/24/2017 was 422 -he had 6 cycles of docetaxel chemotherapy from 11/19/2017 through 03/05/2018. -She had a repeat CT of abdomen and pelvis dated 03/25/2018 which showed interval reduction in size of pelvic lymph nodes which are now less than 10 mm in short axis.  No evidence of new adenopathy in the abdomen or pelvis.  Interval marked improvement in the right hydronephrosis.  Measurable volume reduction in the prostate gland. - PSA on 04/10/2018 was 4.52. -Bone scan dated 04/29/2018 showed widespread metastatic disease, mildly decrease in uptake with no new lesions. -Zytiga and prednisone 5 mg daily started on 05/07/2018.  He is tolerating this well.  Blood pressure is better and potassium is normal. - We will continue 1046m of Zytiga daily and 5  mg of prednisone daily. - PSA on 09/03/2018 was 1.17, PSA on 10/02/2018 it was 1.11. PSA today is pending.  -Labs on 10/30/2018 showed his potassium 4.1, creatinine 1.49, AST 19 ALT 15, LDH 159,  WBC 3.8, hemoglobin 11.3, platelets 174. -We will see him back in 2 month with labs.  2.  Bone metastasis: - Last dose of Xgeva was on 10/02/2018 -Labs on 10/30/2018 showed his calcium of 8.7. -He will get his dose of Xgeva today. -She continues to take her calcium supplements daily. -He will continue to come monthly for his Xgeva injection and labs.  3.  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 -She has received intermittent Feraheme in the past. -Labs done on 09/03/2018 showed hemoglobin up to 12.5 and platelets 225. -Labs done on 10/02/2018 showed his hemoglobin 11.7, ferritin 245, percent saturation 22, platelets 205 -Labs done on 10/30/2018 showed his hemoglobin 11.3, ferritin 276, percent saturation 19, platelets 174. - We do not recommend any replacement iron at this time. -We will recheck an iron panel in 2 month.      Orders placed this encounter:  Orders Placed This Encounter  Procedures   CBC with Differential/Platelet   Comprehensive metabolic panel   PSA   Lactate dehydrogenase   CBC with Differential/Platelet   Comprehensive metabolic panel   Ferritin   Iron and TIBC   Vitamin B12   VITAMIN D 25 Hydroxy (Vit-D Deficiency, Fractures)   PSA      Francene Finders, FNP-C [place] [phone]

## 2018-11-03 MED FILL — ABIRATERONE ACETATE 250 MG: 250 | 30 days supply | Qty: 120 | Fill #0

## 2018-11-27 ENCOUNTER — Inpatient Hospital Stay (HOSPITAL_COMMUNITY): Payer: Medicaid Other | Attending: Hematology

## 2018-11-27 ENCOUNTER — Other Ambulatory Visit: Payer: Self-pay

## 2018-11-27 ENCOUNTER — Inpatient Hospital Stay (HOSPITAL_COMMUNITY): Payer: Medicaid Other

## 2018-11-27 VITALS — BP 122/74 | HR 67 | Temp 97.6°F | Resp 18

## 2018-11-27 DIAGNOSIS — C61 Malignant neoplasm of prostate: Secondary | ICD-10-CM

## 2018-11-27 DIAGNOSIS — C7951 Secondary malignant neoplasm of bone: Secondary | ICD-10-CM | POA: Diagnosis not present

## 2018-11-27 LAB — COMPREHENSIVE METABOLIC PANEL
ALT: 18 U/L (ref 0–44)
AST: 20 U/L (ref 15–41)
Albumin: 4.1 g/dL (ref 3.5–5.0)
Alkaline Phosphatase: 46 U/L (ref 38–126)
Anion gap: 4 — ABNORMAL LOW (ref 5–15)
BUN: 24 mg/dL — ABNORMAL HIGH (ref 8–23)
CO2: 27 mmol/L (ref 22–32)
Calcium: 8.8 mg/dL — ABNORMAL LOW (ref 8.9–10.3)
Chloride: 107 mmol/L (ref 98–111)
Creatinine, Ser: 1.52 mg/dL — ABNORMAL HIGH (ref 0.61–1.24)
GFR calc Af Amer: 50 mL/min — ABNORMAL LOW (ref >60)
GFR calc non Af Amer: 43 mL/min — ABNORMAL LOW (ref >60)
Glucose, Bld: 96 mg/dL (ref 70–99)
Potassium: 4.3 mmol/L (ref 3.5–5.1)
Sodium: 138 mmol/L (ref 135–145)
Total Bilirubin: 1.2 mg/dL (ref 0.3–1.2)
Total Protein: 7 g/dL (ref 6.5–8.1)

## 2018-11-27 MED ORDER — DENOSUMAB 120 MG/1.7ML ~~LOC~~ SOLN
SUBCUTANEOUS | Status: AC
  Filled 2018-11-27: qty 1.7

## 2018-11-27 MED ORDER — DENOSUMAB 120 MG/1.7ML ~~LOC~~ SOLN
120.0000 mg | Freq: Once | SUBCUTANEOUS | Status: AC
  Administered 2018-11-27: 120 mg via SUBCUTANEOUS

## 2018-11-27 NOTE — Progress Notes (Signed)
Carlos Harper presents today for Xgeva injection. Ca level reviewed prior to administration. Pt denies jaw or tooth pain. No recent or planned dental work. Injection given without incident or complaint. VSS. Discharged self ambulatory in satisfactory condition.

## 2018-11-27 NOTE — Patient Instructions (Signed)
[place] at [facility]  Discharge Instructions:  Delton See injection received today.  Denosumab injection What is this medicine? DENOSUMAB (den oh sue mab) slows bone breakdown. Prolia is used to treat osteoporosis in women after menopause and in men, and in people who are taking corticosteroids for 6 months or more. Delton See is used to treat a high calcium level due to cancer and to prevent bone fractures and other bone problems caused by multiple myeloma or cancer bone metastases. Delton See is also used to treat giant cell tumor of the bone. This medicine may be used for other purposes; ask your health care provider or pharmacist if you have questions. COMMON BRAND NAME(S): Prolia, XGEVA What should I tell my health care provider before I take this medicine? They need to know if you have any of these conditions:  dental disease  having surgery or tooth extraction  infection  kidney disease  low levels of calcium or Vitamin D in the blood  malnutrition  on hemodialysis  skin conditions or sensitivity  thyroid or parathyroid disease  an unusual reaction to denosumab, other medicines, foods, dyes, or preservatives  pregnant or trying to get pregnant  breast-feeding How should I use this medicine? This medicine is for injection under the skin. It is given by a health care professional in a hospital or clinic setting. A special MedGuide will be given to you before each treatment. Be sure to read this information carefully each time. For Prolia, talk to your pediatrician regarding the use of this medicine in children. Special care may be needed. For Delton See, talk to your pediatrician regarding the use of this medicine in children. While this drug may be prescribed for children as young as 13 years for selected conditions, precautions do apply. Overdosage: If you think you have taken too much of this medicine contact a poison control center or emergency room at  once. NOTE: This medicine is only for you. Do not share this medicine with others. What if I miss a dose? It is important not to miss your dose. Call your doctor or health care professional if you are unable to keep an appointment. What may interact with this medicine? Do not take this medicine with any of the following medications:  other medicines containing denosumab This medicine may also interact with the following medications:  medicines that lower your chance of fighting infection  steroid medicines like prednisone or cortisone This list may not describe all possible interactions. Give your health care provider a list of all the medicines, herbs, non-prescription drugs, or dietary supplements you use. Also tell them if you smoke, drink alcohol, or use illegal drugs. Some items may interact with your medicine. What should I watch for while using this medicine? Visit your doctor or health care professional for regular checks on your progress. Your doctor or health care professional may order blood tests and other tests to see how you are doing. Call your doctor or health care professional for advice if you get a fever, chills or sore throat, or other symptoms of a cold or flu. Do not treat yourself. This drug may decrease your body's ability to fight infection. Try to avoid being around people who are sick. You should make sure you get enough calcium and vitamin D while you are taking this medicine, unless your doctor tells you not to. Discuss the foods you eat and the vitamins you take with your health care professional. See your dentist regularly. Brush and  floss your teeth as directed. Before you have any dental work done, tell your dentist you are receiving this medicine. Do not become pregnant while taking this medicine or for 5 months after stopping it. Talk with your doctor or health care professional about your birth control options while taking this medicine. Women should inform  their doctor if they wish to become pregnant or think they might be pregnant. There is a potential for serious side effects to an unborn child. Talk to your health care professional or pharmacist for more information. What side effects may I notice from receiving this medicine? Side effects that you should report to your doctor or health care professional as soon as possible:  allergic reactions like skin rash, itching or hives, swelling of the face, lips, or tongue  bone pain  breathing problems  dizziness  jaw pain, especially after dental work  redness, blistering, peeling of the skin  signs and symptoms of infection like fever or chills; cough; sore throat; pain or trouble passing urine  signs of low calcium like fast heartbeat, muscle cramps or muscle pain; pain, tingling, numbness in the hands or feet; seizures  unusual bleeding or bruising  unusually weak or tired Side effects that usually do not require medical attention (report to your doctor or health care professional if they continue or are bothersome):  constipation  diarrhea  headache  joint pain  loss of appetite  muscle pain  runny nose  tiredness  upset stomach This list may not describe all possible side effects. Call your doctor for medical advice about side effects. You may report side effects to FDA at 1-800-FDA-1088. Where should I keep my medicine? This medicine is only given in a clinic, doctor's office, or other health care setting and will not be stored at home. NOTE: This sheet is a summary. It may not cover all possible information. If you have questions about this medicine, talk to your doctor, pharmacist, or health care provider.  2020 Elsevier/Gold Standard (2017-08-16 16:10:44)  _______________________________________________________________  Thank you for choosing [place] at [facility] to provide your oncology and hematology care.  To afford each patient  quality time with our providers, please arrive at least 15 minutes before your scheduled appointment.  You need to re-schedule your appointment if you arrive 10 or more minutes late.  We strive to give you quality time with our providers, and arriving late affects you and other patients whose appointments are after yours.  Also, if you no show three or more times for appointments you may be dismissed from the clinic.  Again, thank you for choosing [place] at [place] hope is that these requests will allow you access to exceptional care and in a timely manner. _______________________________________________________________  If you have questions after your visit, please contact our office at [phone] between the hours of 8:30 a.m. and 5:00 p.m. Voicemails left after 4:30 p.m. will not be returned until the following business day. _______________________________________________________________  For prescription refill requests, have your pharmacy contact our office. _______________________________________________________________  Recommendations made by the consultant and any test results will be sent to your referring physician. _______________________________________________________________

## 2018-12-03 ENCOUNTER — Other Ambulatory Visit (HOSPITAL_COMMUNITY): Payer: Self-pay | Admitting: Nurse Practitioner

## 2018-12-03 DIAGNOSIS — C61 Malignant neoplasm of prostate: Secondary | ICD-10-CM

## 2018-12-04 MED FILL — ABIRATERONE ACETATE 250 MG: 250 | 30 days supply | Qty: 120 | Fill #0

## 2018-12-05 ENCOUNTER — Other Ambulatory Visit (HOSPITAL_COMMUNITY): Payer: Self-pay | Admitting: Nurse Practitioner

## 2018-12-25 ENCOUNTER — Inpatient Hospital Stay (HOSPITAL_COMMUNITY): Payer: Medicaid Other | Attending: Hematology

## 2018-12-25 ENCOUNTER — Other Ambulatory Visit: Payer: Self-pay

## 2018-12-25 ENCOUNTER — Inpatient Hospital Stay (HOSPITAL_COMMUNITY): Payer: Medicaid Other

## 2018-12-25 ENCOUNTER — Inpatient Hospital Stay (HOSPITAL_BASED_OUTPATIENT_CLINIC_OR_DEPARTMENT_OTHER): Payer: Medicaid Other | Admitting: Nurse Practitioner

## 2018-12-25 ENCOUNTER — Encounter (HOSPITAL_COMMUNITY): Payer: Self-pay | Admitting: Nurse Practitioner

## 2018-12-25 DIAGNOSIS — D631 Anemia in chronic kidney disease: Secondary | ICD-10-CM | POA: Diagnosis not present

## 2018-12-25 DIAGNOSIS — C61 Malignant neoplasm of prostate: Secondary | ICD-10-CM | POA: Diagnosis present

## 2018-12-25 DIAGNOSIS — N189 Chronic kidney disease, unspecified: Secondary | ICD-10-CM | POA: Diagnosis not present

## 2018-12-25 DIAGNOSIS — C7951 Secondary malignant neoplasm of bone: Secondary | ICD-10-CM | POA: Insufficient documentation

## 2018-12-25 LAB — CBC WITH DIFFERENTIAL/PLATELET
Abs Immature Granulocytes: 0.01 10*3/uL (ref 0.00–0.07)
Basophils Absolute: 0 10*3/uL (ref 0.0–0.1)
Basophils Relative: 1 %
Eosinophils Absolute: 0 10*3/uL (ref 0.0–0.5)
Eosinophils Relative: 1 %
HCT: 34.9 % — ABNORMAL LOW (ref 39.0–52.0)
Hemoglobin: 11.6 g/dL — ABNORMAL LOW (ref 13.0–17.0)
Immature Granulocytes: 0 %
Lymphocytes Relative: 53 %
Lymphs Abs: 2.3 10*3/uL (ref 0.7–4.0)
MCH: 31.7 pg (ref 26.0–34.0)
MCHC: 33.2 g/dL (ref 30.0–36.0)
MCV: 95.4 fL (ref 80.0–100.0)
Monocytes Absolute: 0.4 10*3/uL (ref 0.1–1.0)
Monocytes Relative: 8 %
Neutro Abs: 1.6 10*3/uL — ABNORMAL LOW (ref 1.7–7.7)
Neutrophils Relative %: 37 %
Platelets: 219 10*3/uL (ref 150–400)
RBC: 3.66 MIL/uL — ABNORMAL LOW (ref 4.22–5.81)
RDW: 13.6 % (ref 11.5–15.5)
WBC: 4.3 10*3/uL (ref 4.0–10.5)
nRBC: 0 % (ref 0.0–0.2)

## 2018-12-25 LAB — COMPREHENSIVE METABOLIC PANEL
ALT: 14 U/L (ref 0–44)
AST: 19 U/L (ref 15–41)
Albumin: 4 g/dL (ref 3.5–5.0)
Alkaline Phosphatase: 44 U/L (ref 38–126)
Anion gap: 9 (ref 5–15)
BUN: 24 mg/dL — ABNORMAL HIGH (ref 8–23)
CO2: 24 mmol/L (ref 22–32)
Calcium: 8.8 mg/dL — ABNORMAL LOW (ref 8.9–10.3)
Chloride: 106 mmol/L (ref 98–111)
Creatinine, Ser: 1.84 mg/dL — ABNORMAL HIGH (ref 0.61–1.24)
GFR calc Af Amer: 40 mL/min — ABNORMAL LOW (ref >60)
GFR calc non Af Amer: 34 mL/min — ABNORMAL LOW (ref >60)
Glucose, Bld: 111 mg/dL — ABNORMAL HIGH (ref 70–99)
Potassium: 3.9 mmol/L (ref 3.5–5.1)
Sodium: 139 mmol/L (ref 135–145)
Total Bilirubin: 1.2 mg/dL (ref 0.3–1.2)
Total Protein: 7.1 g/dL (ref 6.5–8.1)

## 2018-12-25 LAB — PSA: Prostatic Specific Antigen: 0.69 ng/mL (ref 0.00–4.00)

## 2018-12-25 MED ORDER — DENOSUMAB 120 MG/1.7ML ~~LOC~~ SOLN
120.0000 mg | Freq: Once | SUBCUTANEOUS | Status: AC
  Administered 2018-12-25: 120 mg via SUBCUTANEOUS
  Filled 2018-12-25: qty 1.7

## 2018-12-25 NOTE — Patient Instructions (Signed)
[place] at [facility] Discharge Instructions  Follow up in 2 months for your labs and office visit. Continue monthly labs and injections.   Thank you for choosing [place] at [facility] to provide your oncology and hematology care.  To afford each patient quality time with our provider, please arrive at least 15 minutes before your scheduled appointment time.   If you have a lab appointment with the [facility] please come in thru the Main Entrance and check in at the main information desk.  You need to re-schedule your appointment should you arrive 10 or more minutes late.  We strive to give you quality time with our providers, and arriving late affects you and other patients whose appointments are after yours.  Also, if you no show three or more times for appointments you may be dismissed from the clinic at the providers discretion.     Again, thank you for choosing [facility].  Our hope is that these requests will decrease the amount of time that you wait before being seen by our physicians.       _____________________________________________________________  Should you have questions after your visit to [facility], please contact our office at [phone] between the hours of 8:00 a.m. and 4:30 p.m.  Voicemails left after 4:00 p.m. will not be returned until the following business day.  For prescription refill requests, have your pharmacy contact our office and allow 72 hours.    Due to Covid, you will need to wear a mask upon entering the hospital. If you do not have a mask, a mask will be given to you at the Main Entrance upon arrival. For doctor visits, patients may have 1 support person with them. For treatment visits, patients can not have anyone with them due to social distancing guidelines and our immunocompromised population.

## 2018-12-25 NOTE — Progress Notes (Signed)
Carlos Harper presents today for Xgeva injection. Lab work reviewed prior to administration. VSS. Pt reports that he has not been taking the Ca and Vit D supplements because it "messes with my bowels." He states that he drinks orange juice with added Ca and Vit D instead. Denies jaw/tooth pain and has no recent or future invasive dental work scheduled. Pt tolerated injection without incident or complaint. See MAR for details. Site clean and dry, band aid applied. Discharged in satisfactory condition with follow up instructions.

## 2018-12-25 NOTE — Assessment & Plan Note (Addendum)
1.  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 - He did report back pain and chest wall pain for 3 months prior which improved after orchiectomy. - He had a bone scan on 09/12/2017 which showed extensive abnormal uptake in the shoulders, sternum, ribs, throughout the spine, pelvis and proximal to mid femurs. - He had a CT of the abdomen and pelvis without contrast on 09/12/2017 which showed extensive retroperitoneal adenopathy, right hydronephrosis. - He underwent a prostate biopsy on 09/17/2017.  (Gleason 9) bilateral orchiectomy, cystoscopy with fulguration of bladder neck, and attempted right ureteral stent placement. - PSA on 09/24/2017 was 422 -he had 6 cycles of docetaxel chemotherapy from 11/19/2017 through 03/05/2018. -She had a repeat CT of abdomen and pelvis dated 03/25/2018 which showed interval reduction in size of pelvic lymph nodes which are now less than 10 mm in short axis.  No evidence of new adenopathy in the abdomen or pelvis.  Interval marked improvement in the right hydronephrosis.  Measurable volume reduction in the prostate gland. - PSA on 04/10/2018 was 4.52. -Bone scan dated 04/29/2018 showed widespread metastatic disease, mildly decrease in uptake with no new lesions. -Zytiga and prednisone 5 mg daily started on 05/07/2018.  He is tolerating this well.  Blood pressure is better and potassium is normal. - We will continue 1000mg  of Zytiga daily and 5 mg of prednisone daily. - PSA on 10/02/2018 it was 1.11. PSA on 10/30/2018 is 0.86. Today's PSA is pending.  -Labs on 01/21/2019 showed his potassium 3.9, creatinine 1.84, AST 19 ALT 14, WBC 4.3, hemoglobin 11.6, platelets 219. -We will see him back in 2 month with labs.  2.  Bone metastasis: - Last dose of Xgeva was on 10/02/2018 -Labs on 12/25/2018 showed his calcium of 8.8 -He will get his dose of Xgeva today. -She continues to take  her calcium supplements daily. -He will continue to come monthly for his Xgeva injection and labs.  3.  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 -She has received intermittent Feraheme in the past. -Labs done on 10/02/2018 showed his hemoglobin 11.7, ferritin 245, percent saturation 22, platelets 205 -Labs done on 10/30/2018 showed his hemoglobin 11.3, ferritin 276, percent saturation 19, platelets 174. -Labs done on 12/25/2018 showed his hemoglobin 11.6, platelets 219 - We do not recommend any replacement iron at this time. -We will recheck an iron panel in 2 month.

## 2018-12-25 NOTE — Patient Instructions (Signed)
[place] at [facility]  Discharge Instructions:  You received your Xgeva injection today.   Denosumab injection What is this medicine? DENOSUMAB (den oh sue mab) slows bone breakdown. Prolia is used to treat osteoporosis in women after menopause and in men, and in people who are taking corticosteroids for 6 months or more. Delton See is used to treat a high calcium level due to cancer and to prevent bone fractures and other bone problems caused by multiple myeloma or cancer bone metastases. Delton See is also used to treat giant cell tumor of the bone. This medicine may be used for other purposes; ask your health care provider or pharmacist if you have questions. COMMON BRAND NAME(S): Prolia, XGEVA What should I tell my health care provider before I take this medicine? They need to know if you have any of these conditions:  dental disease  having surgery or tooth extraction  infection  kidney disease  low levels of calcium or Vitamin D in the blood  malnutrition  on hemodialysis  skin conditions or sensitivity  thyroid or parathyroid disease  an unusual reaction to denosumab, other medicines, foods, dyes, or preservatives  pregnant or trying to get pregnant  breast-feeding How should I use this medicine? This medicine is for injection under the skin. It is given by a health care professional in a hospital or clinic setting. A special MedGuide will be given to you before each treatment. Be sure to read this information carefully each time. For Prolia, talk to your pediatrician regarding the use of this medicine in children. Special care may be needed. For Delton See, talk to your pediatrician regarding the use of this medicine in children. While this drug may be prescribed for children as young as 13 years for selected conditions, precautions do apply. Overdosage: If you think you have taken too much of this medicine contact a poison control center or emergency room  at once. NOTE: This medicine is only for you. Do not share this medicine with others. What if I miss a dose? It is important not to miss your dose. Call your doctor or health care professional if you are unable to keep an appointment. What may interact with this medicine? Do not take this medicine with any of the following medications:  other medicines containing denosumab This medicine may also interact with the following medications:  medicines that lower your chance of fighting infection  steroid medicines like prednisone or cortisone This list may not describe all possible interactions. Give your health care provider a list of all the medicines, herbs, non-prescription drugs, or dietary supplements you use. Also tell them if you smoke, drink alcohol, or use illegal drugs. Some items may interact with your medicine. What should I watch for while using this medicine? Visit your doctor or health care professional for regular checks on your progress. Your doctor or health care professional may order blood tests and other tests to see how you are doing. Call your doctor or health care professional for advice if you get a fever, chills or sore throat, or other symptoms of a cold or flu. Do not treat yourself. This drug may decrease your body's ability to fight infection. Try to avoid being around people who are sick. You should make sure you get enough calcium and vitamin D while you are taking this medicine, unless your doctor tells you not to. Discuss the foods you eat and the vitamins you take with your health care professional. See your dentist  regularly. Brush and floss your teeth as directed. Before you have any dental work done, tell your dentist you are receiving this medicine. Do not become pregnant while taking this medicine or for 5 months after stopping it. Talk with your doctor or health care professional about your birth control options while taking this medicine. Women should inform  their doctor if they wish to become pregnant or think they might be pregnant. There is a potential for serious side effects to an unborn child. Talk to your health care professional or pharmacist for more information. What side effects may I notice from receiving this medicine? Side effects that you should report to your doctor or health care professional as soon as possible:  allergic reactions like skin rash, itching or hives, swelling of the face, lips, or tongue  bone pain  breathing problems  dizziness  jaw pain, especially after dental work  redness, blistering, peeling of the skin  signs and symptoms of infection like fever or chills; cough; sore throat; pain or trouble passing urine  signs of low calcium like fast heartbeat, muscle cramps or muscle pain; pain, tingling, numbness in the hands or feet; seizures  unusual bleeding or bruising  unusually weak or tired Side effects that usually do not require medical attention (report to your doctor or health care professional if they continue or are bothersome):  constipation  diarrhea  headache  joint pain  loss of appetite  muscle pain  runny nose  tiredness  upset stomach This list may not describe all possible side effects. Call your doctor for medical advice about side effects. You may report side effects to FDA at 1-800-FDA-1088. Where should I keep my medicine? This medicine is only given in a clinic, doctor's office, or other health care setting and will not be stored at home. NOTE: This sheet is a summary. It may not cover all possible information. If you have questions about this medicine, talk to your doctor, pharmacist, or health care provider.  2020 Elsevier/Gold Standard (2017-08-16 16:10:44)  _______________________________________________________________  Thank you for choosing [place] at [facility] to provide your oncology and hematology care.  To afford each patient  quality time with our providers, please arrive at least 15 minutes before your scheduled appointment.  You need to re-schedule your appointment if you arrive 10 or more minutes late.  We strive to give you quality time with our providers, and arriving late affects you and other patients whose appointments are after yours.  Also, if you no show three or more times for appointments you may be dismissed from the clinic.  Again, thank you for choosing [place] at [facility] [place] hope is that these requests will allow you access to exceptional care and in a timely manner. _______________________________________________________________  If you have questions after your visit, please contact our office at [phone] between the hours of 8:30 a.m. and 5:00 p.m. Voicemails left after 4:30 p.m. will not be returned until the following business day. _______________________________________________________________  For prescription refill requests, have your pharmacy contact our office. _______________________________________________________________  Recommendations made by the consultant and any test results will be sent to your referring physician. _______________________________________________________________

## 2018-12-25 NOTE — Progress Notes (Signed)
[place], [place] [address]   CLINIC:  Medical Oncology/Hematology  PCP:  Raiford Simmonds., PA-C [address] [place]  [address] [phone]   REASON FOR VISIT: Follow-up for prostate cancer  CURRENT THERAPY: Zytiga and prednisone  BRIEF ONCOLOGIC HISTORY:  Oncology History  Malignant neoplasm of prostate ([place])  09/27/2017 Initial Diagnosis   Malignant neoplasm of prostate ([place])   11/19/2017 - 03/25/2018 Chemotherapy   The patient had pegfilgrastim (NEULASTA) injection 6 mg, 6 mg, Subcutaneous, Once, 6 of 6 cycles Administration: 6 mg (11/21/2017), 6 mg (12/12/2017), 6 mg (01/02/2018), 6 mg (01/24/2018), 6 mg (02/14/2018), 6 mg (03/07/2018) pegfilgrastim (NEULASTA ONPRO KIT) injection 6 mg, 6 mg, Subcutaneous, Once, 2 of 2 cycles DOCEtaxel (TAXOTERE) 140 mg in sodium chloride 0.9 % 250 mL chemo infusion, 75 mg/m2 = 140 mg, Intravenous,  Once, 6 of 6 cycles Administration: 140 mg (11/19/2017), 140 mg (12/10/2017), 140 mg (01/22/2018), 140 mg (12/31/2017), 140 mg (02/12/2018), 140 mg (03/05/2018) ondansetron (ZOFRAN) 8 mg, dexamethasone (DECADRON) 10 mg in sodium chloride 0.9 % 50 mL IVPB, , Intravenous,  Once, 4 of 4 cycles Administration:  (12/31/2017),  (01/22/2018),  (02/12/2018),  (03/05/2018)  for chemotherapy treatment.       INTERVAL HISTORY:  Mr. Salguero 78 y.o. male returns for routine follow-up for prostate cancer.  He reports he has been doing well since his last visit.  He does report he is having a few more hot flashes than normal.  He reports he is trying to take black cohosh for his hot flashes.  He denies any bright red bleeding per rectum or melena.  He denies any new bone pain. Denies any nausea, vomiting, or diarrhea. Denies any new pains. Had not noticed any recent bleeding such as epistaxis, hematuria or hematochezia. Denies recent chest pain on exertion, shortness of breath on minimal exertion, pre-syncopal episodes, or  palpitations. Denies any numbness or tingling in hands or feet. Denies any recent fevers, infections, or recent hospitalizations. Patient reports appetite at 100% and energy level at 25%.  He is eating well maintain his weight this time.   REVIEW OF SYSTEMS:  Review of Systems  Endocrine: Positive for hot flashes.  Psychiatric/Behavioral: Positive for sleep disturbance.  All other systems reviewed and are negative.    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   Sickle cell trait ([place])    Vertigo    Past Surgical History:  Procedure Laterality Date   CYSTOSCOPY WITH FULGERATION  09/17/2017   Procedure: CYSTOSCOPY WITH FULGERATION OF BLADDER NECK;  Surgeon: [name], MD;  Location: [facility];  Service: Urology;;   CYSTOSCOPY WITH STENT PLACEMENT Right 09/17/2017   Procedure: CYSTOSCOPY WITH RIGHT RETROGRADE PYELOGRAM ATTEMPTED STENT PLACEMENT;  Surgeon: [name], MD;  Location: [facility];  Service: Urology;  Laterality: Right;   left leg surgery due to [place] Bilateral 09/17/2017   Procedure: ORCHIECTOMY;  Surgeon: [name], MD;  Location: [facility];  Service: Urology;  Laterality: Bilateral;   PORTACATH PLACEMENT Left 10/14/2017   Procedure: INSERTION PORT-A-CATH;  Surgeon: Aviva Signs, MD;  Location: [facility];  Service: General;  Laterality: Left;   PROSTATE BIOPSY N/A 09/17/2017   Procedure: BIOPSY TRANSRECTAL ULTRASONIC PROSTATE (TUBP);  Surgeon: [name], MD;  Location: [facility];  Service: Urology;  Laterality:  N/A;     SOCIAL HISTORY:  Social History   Socioeconomic History   Marital status: Single    Spouse name: Not on file   Number of children: Not on file   Years of education: Not on file   Highest education level: Not on file  Occupational History   Not on file  Social Needs   Financial resource strain: Not on  file   Food insecurity    Worry: Not on file    Inability: Not on file   Transportation needs    Medical: Not on file    Non-medical: Not on file  Tobacco Use   Smoking status: Never Smoker   Smokeless tobacco: Never Used  Substance and Sexual Activity   Alcohol use: Yes    Comment: Drinks beer occasionally    Drug use: No   Sexual activity: Not on file  Lifestyle   Physical activity    Days per week: Not on file    Minutes per session: Not on file   Stress: Not on file  Relationships   Social connections    Talks on phone: Not on file    Gets together: Not on file    Attends religious service: Not on file    Active member of club or organization: Not on file    Attends meetings of clubs or organizations: Not on file    Relationship status: Not on file   Intimate partner violence    Fear of current or ex partner: Not on file    Emotionally abused: Not on file    Physically abused: Not on file    Forced sexual activity: Not on file  Other Topics Concern   Not on file  Social History Narrative   Not on file    FAMILY HISTORY:  Family History  Problem Relation Age of Onset   Hypertension Mother    Lung cancer Father    Lung cancer Brother    HIV/AIDS Brother    Breast cancer Daughter     CURRENT MEDICATIONS:  Outpatient Encounter Medications as of 12/25/2018  Medication Sig   abiraterone acetate (ZYTIGA) 250 MG tablet TAKE 4 TABLETS (1,000 MG TOTAL) BY MOUTH DAILY. TAKE ON AN EMPTY STOMACH 1 HOUR BEFORE OR 2 HOURS AFTER A MEAL   docusate sodium (COLACE) 100 MG capsule Take 100 mg by mouth daily.   [DISCONTINUED] predniSONE (DELTASONE) 5 MG tablet Take 1 tablet by mouth once daily with breakfast   prochlorperazine (COMPAZINE) 10 MG tablet Take 1 tablet (10 mg total) by mouth every 6 (six) hours as needed (Nausea or vomiting). (Patient not taking: Reported on 10/30/2018)   [DISCONTINUED] lidocaine-prilocaine (EMLA) cream Apply to affected area  once   No facility-administered encounter medications on file as of 12/25/2018.     ALLERGIES:  No Known Allergies   PHYSICAL EXAM:  ECOG Performance status: 1  Vitals:   12/25/18 1134  BP: 134/76  Pulse: 66  Resp: 18  Temp: 97.9 F (36.6 C)  SpO2: 100%   Filed Weights   12/25/18 1134  Weight: 173 lb (78.5 kg)    Physical Exam Constitutional:      Appearance: Normal appearance. He is normal weight.  Cardiovascular:     Rate and Rhythm: Normal rate and regular rhythm.     Heart sounds: Normal heart sounds.  Pulmonary:     Effort: Pulmonary effort is normal.     Breath sounds: Normal breath sounds.  Abdominal:  General: Bowel sounds are normal.     Palpations: Abdomen is soft.  Musculoskeletal: Normal range of motion.  Skin:    General: Skin is warm and dry.  Neurological:     Mental Status: He is alert and oriented to person, place, and time. Mental status is at baseline.  Psychiatric:        Mood and Affect: Mood normal.        Behavior: Behavior normal.        Thought Content: Thought content normal.        Judgment: Judgment normal.      LABORATORY DATA:  I have reviewed the labs as listed.  CBC    Component Value Date/Time   WBC 4.3 12/25/2018 0957   RBC 3.66 (L) 12/25/2018 0957   HGB 11.6 (L) 12/25/2018 0957   HCT 34.9 (L) 12/25/2018 0957   PLT 219 12/25/2018 0957   MCV 95.4 12/25/2018 0957   MCH 31.7 12/25/2018 0957   MCHC 33.2 12/25/2018 0957   RDW 13.6 12/25/2018 0957   LYMPHSABS 2.3 12/25/2018 0957   MONOABS 0.4 12/25/2018 0957   EOSABS 0.0 12/25/2018 0957   BASOSABS 0.0 12/25/2018 0957   CMP Latest Ref Rng & Units 12/25/2018 11/27/2018 10/30/2018  Glucose 70 - 99 mg/dL 111(H) 96 88  BUN 8 - 23 mg/dL 24(H) 24(H) 26(H)  Creatinine 0.61 - 1.24 mg/dL 1.84(H) 1.52(H) 1.49(H)  Sodium 135 - 145 mmol/L 139 138 140  Potassium 3.5 - 5.1 mmol/L 3.9 4.3 4.1  Chloride 98 - 111 mmol/L 106 107 108  CO2 22 - 32 mmol/L _0 Calcium 8.9 - 10.3  mg/dL 8.8(L) 8.8(L) 8.7(L)  Total Protein 6.5 - 8.1 g/dL 7.1 7.0 6.6  Total Bilirubin 0.3 - 1.2 mg/dL 1.2 1.2 1.2  Alkaline Phos 38 - 126 U/L 44 46 45  AST 15 - 41 U/L _1 ALT 0 - 44 U/L _2 I personally performed a face-to-face visit.  All questions were answered to patient's stated satisfaction. Encouraged patient to call with any new concerns or questions before his next visit to the cancer center and we can certain see him sooner, if needed.     ASSESSMENT & PLAN:   Malignant neoplasm of prostate ([place]) 1.  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 - He did report back pain and chest wall pain for 3 months prior which improved after orchiectomy. - He had a bone scan on 09/12/2017 which showed extensive abnormal uptake in the shoulders, sternum, ribs, throughout the spine, pelvis and proximal to mid femurs. - He had a CT of the abdomen and pelvis without contrast on 09/12/2017 which showed extensive retroperitoneal adenopathy, right hydronephrosis. - He underwent a prostate biopsy on 09/17/2017.  (Gleason 9) bilateral orchiectomy, cystoscopy with fulguration of bladder neck, and attempted right ureteral stent placement. - PSA on 09/24/2017 was 422 -he had 6 cycles of docetaxel chemotherapy from 11/19/2017 through 03/05/2018. -She had a repeat CT of abdomen and pelvis dated 03/25/2018 which showed interval reduction in size of pelvic lymph nodes which are now less than 10 mm in short axis.  No evidence of new adenopathy in the abdomen or pelvis.  Interval marked improvement in the right hydronephrosis.  Measurable volume reduction in the prostate gland. - PSA on 04/10/2018 was 4.52. -Bone scan dated 04/29/2018  showed widespread metastatic disease, mildly decrease in uptake with no new lesions. -Zytiga and prednisone 5 mg daily started on 05/07/2018.  He is tolerating this well.   Blood pressure is better and potassium is normal. - We will continue 107m of Zytiga daily and 5 mg of prednisone daily. - PSA on 10/02/2018 it was 1.11. PSA on 10/30/2018 is 0.86. Today's PSA is pending.  -Labs on 01/21/2019 showed his potassium 3.9, creatinine 1.84, AST 19 ALT 14, WBC 4.3, hemoglobin 11.6, platelets 219. -We will see him back in 2 month with labs.  2.  Bone metastasis: - Last dose of Xgeva was on 10/02/2018 -Labs on 12/25/2018 showed his calcium of 8.8 -He will get his dose of Xgeva today. -She continues to take her calcium supplements daily. -He will continue to come monthly for his Xgeva injection and labs.  3.  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 -She has received intermittent Feraheme in the past. -Labs done on 10/02/2018 showed his hemoglobin 11.7, ferritin 245, percent saturation 22, platelets 205 -Labs done on 10/30/2018 showed his hemoglobin 11.3, ferritin 276, percent saturation 19, platelets 174. -Labs done on 12/25/2018 showed his hemoglobin 11.6, platelets 219 - We do not recommend any replacement iron at this time. -We will recheck an iron panel in 2 month.      Orders placed this encounter:  Orders Placed This Encounter  Procedures   Lactate dehydrogenase   CBC with Differential/Platelet   Comprehensive metabolic panel   Ferritin   Iron and TIBC   Vitamin B12   VITAMIN D 25 Hydroxy (Vit-D Deficiency, Fractures)   Folate      RFrancene Finders FNP-C [id number]

## 2018-12-31 ENCOUNTER — Other Ambulatory Visit (HOSPITAL_COMMUNITY): Payer: Self-pay | Admitting: Nurse Practitioner

## 2018-12-31 DIAGNOSIS — C61 Malignant neoplasm of prostate: Secondary | ICD-10-CM

## 2018-12-31 MED ORDER — PREDNISONE 5 MG PO TABS: 5.0000 mg | ORAL_TABLET | Freq: Every day | ORAL | 6 refills | Status: DC

## 2019-01-01 MED FILL — predniSONE 5 MG TABS: 5 | 30 days supply | Qty: 30 | Fill #0

## 2019-01-01 MED FILL — ABIRATERONE ACETATE 250 MG: 250 | 30 days supply | Qty: 120 | Fill #0

## 2019-01-22 ENCOUNTER — Inpatient Hospital Stay (HOSPITAL_COMMUNITY): Payer: Medicaid Other | Attending: Hematology

## 2019-01-22 ENCOUNTER — Ambulatory Visit (HOSPITAL_COMMUNITY): Payer: Medicaid Other

## 2019-01-22 DIAGNOSIS — C7951 Secondary malignant neoplasm of bone: Secondary | ICD-10-CM | POA: Insufficient documentation

## 2019-01-22 DIAGNOSIS — C61 Malignant neoplasm of prostate: Secondary | ICD-10-CM | POA: Insufficient documentation

## 2019-01-27 ENCOUNTER — Other Ambulatory Visit (HOSPITAL_COMMUNITY): Payer: Self-pay

## 2019-01-27 DIAGNOSIS — C61 Malignant neoplasm of prostate: Secondary | ICD-10-CM

## 2019-01-28 ENCOUNTER — Inpatient Hospital Stay (HOSPITAL_COMMUNITY): Payer: Medicaid Other

## 2019-01-28 ENCOUNTER — Other Ambulatory Visit: Payer: Self-pay

## 2019-01-28 ENCOUNTER — Inpatient Hospital Stay (HOSPITAL_COMMUNITY): Payer: Medicaid Other | Attending: Hematology

## 2019-01-28 VITALS — BP 129/85 | HR 63 | Temp 97.9°F | Resp 18

## 2019-01-28 DIAGNOSIS — C61 Malignant neoplasm of prostate: Secondary | ICD-10-CM

## 2019-01-28 DIAGNOSIS — C7951 Secondary malignant neoplasm of bone: Secondary | ICD-10-CM | POA: Diagnosis present

## 2019-01-28 LAB — COMPREHENSIVE METABOLIC PANEL
ALT: 15 U/L (ref 0–44)
AST: 19 U/L (ref 15–41)
Albumin: 4 g/dL (ref 3.5–5.0)
Alkaline Phosphatase: 47 U/L (ref 38–126)
Anion gap: 10 (ref 5–15)
BUN: 27 mg/dL — ABNORMAL HIGH (ref 8–23)
CO2: 22 mmol/L (ref 22–32)
Calcium: 9 mg/dL (ref 8.9–10.3)
Chloride: 104 mmol/L (ref 98–111)
Creatinine, Ser: 1.43 mg/dL — ABNORMAL HIGH (ref 0.61–1.24)
GFR calc Af Amer: 54 mL/min — ABNORMAL LOW (ref >60)
GFR calc non Af Amer: 47 mL/min — ABNORMAL LOW (ref >60)
Glucose, Bld: 119 mg/dL — ABNORMAL HIGH (ref 70–99)
Potassium: 3.8 mmol/L (ref 3.5–5.1)
Sodium: 136 mmol/L (ref 135–145)
Total Bilirubin: 1.2 mg/dL (ref 0.3–1.2)
Total Protein: 6.9 g/dL (ref 6.5–8.1)

## 2019-01-28 MED ORDER — DENOSUMAB 120 MG/1.7ML ~~LOC~~ SOLN
120.0000 mg | Freq: Once | SUBCUTANEOUS | Status: AC
  Administered 2019-01-28: 13:00:00 120 mg via SUBCUTANEOUS
  Filled 2019-01-28: qty 1.7

## 2019-01-28 NOTE — Progress Notes (Signed)
Carlos Harper presents today for denosumab injection. Lab work reviewed prior to administration. VSS. Pt reports he is not taking Ca and Vit D, but is still drinking the orange juice with Ca and Vit D in it. Pt denies tooth/jaw pain and denies recent or future invasive dental work. Injection tolerated well, see MAR for details. Site clean and dry, band aid applied. Pt discharged in satisfactory condition with follow up instructions.

## 2019-01-28 NOTE — Patient Instructions (Signed)
[facility] at [facility]  Discharge Instructions:  Xgeva injection received today. _______________________________________________________________  Thank you for choosing [facility] at [facility] to provide your oncology and hematology care.  To afford each patient quality time with our providers, please arrive at least 15 minutes before your scheduled appointment.  You need to re-schedule your appointment if you arrive 10 or more minutes late.  We strive to give you quality time with our providers, and arriving late affects you and other patients whose appointments are after yours.  Also, if you no show three or more times for appointments you may be dismissed from the clinic.  Again, thank you for choosing [facility]. Our hope is that these requests will allow you access to exceptional care and in a timely manner. _______________________________________________________________  If you have questions after your visit, please contact our office at [phone] between the hours of 8:30 a.m. and 5:00 p.m. Voicemails left after 4:30 p.m. will not be returned until the following business day. _______________________________________________________________  For prescription refill requests, have your pharmacy contact our office. _______________________________________________________________  Recommendations made by the consultant and any test results will be sent to your referring physician. _______________________________________________________________ 

## 2019-01-29 ENCOUNTER — Other Ambulatory Visit (HOSPITAL_COMMUNITY): Payer: Self-pay | Admitting: Hematology

## 2019-01-29 DIAGNOSIS — C61 Malignant neoplasm of prostate: Secondary | ICD-10-CM

## 2019-02-02 MED FILL — ABIRATERONE ACETATE 250 MG: 250 | 30 days supply | Qty: 120 | Fill #0

## 2019-02-02 MED FILL — predniSONE 5 MG TABS: 5 | 30 days supply | Qty: 30 | Fill #1

## 2019-02-19 ENCOUNTER — Other Ambulatory Visit (HOSPITAL_COMMUNITY): Payer: Medicaid Other

## 2019-02-19 ENCOUNTER — Ambulatory Visit (HOSPITAL_COMMUNITY): Payer: Medicaid Other | Admitting: Nurse Practitioner

## 2019-02-19 ENCOUNTER — Ambulatory Visit (HOSPITAL_COMMUNITY): Payer: Medicaid Other

## 2019-02-25 ENCOUNTER — Inpatient Hospital Stay (HOSPITAL_BASED_OUTPATIENT_CLINIC_OR_DEPARTMENT_OTHER): Payer: Medicaid Other | Admitting: Nurse Practitioner

## 2019-02-25 ENCOUNTER — Inpatient Hospital Stay (HOSPITAL_COMMUNITY): Payer: Medicaid Other

## 2019-02-25 ENCOUNTER — Inpatient Hospital Stay (HOSPITAL_COMMUNITY): Payer: Medicaid Other | Attending: Nurse Practitioner

## 2019-02-25 ENCOUNTER — Other Ambulatory Visit: Payer: Self-pay

## 2019-02-25 DIAGNOSIS — N189 Chronic kidney disease, unspecified: Secondary | ICD-10-CM | POA: Insufficient documentation

## 2019-02-25 DIAGNOSIS — C61 Malignant neoplasm of prostate: Secondary | ICD-10-CM

## 2019-02-25 DIAGNOSIS — C7951 Secondary malignant neoplasm of bone: Secondary | ICD-10-CM | POA: Diagnosis not present

## 2019-02-25 DIAGNOSIS — D631 Anemia in chronic kidney disease: Secondary | ICD-10-CM | POA: Diagnosis not present

## 2019-02-25 LAB — COMPREHENSIVE METABOLIC PANEL
ALT: 15 U/L (ref 0–44)
AST: 17 U/L (ref 15–41)
Albumin: 3.8 g/dL (ref 3.5–5.0)
Alkaline Phosphatase: 45 U/L (ref 38–126)
Anion gap: 10 (ref 5–15)
BUN: 21 mg/dL (ref 8–23)
CO2: 25 mmol/L (ref 22–32)
Calcium: 9.1 mg/dL (ref 8.9–10.3)
Chloride: 107 mmol/L (ref 98–111)
Creatinine, Ser: 1.59 mg/dL — ABNORMAL HIGH (ref 0.61–1.24)
GFR calc Af Amer: 47 mL/min — ABNORMAL LOW (ref >60)
GFR calc non Af Amer: 41 mL/min — ABNORMAL LOW (ref >60)
Glucose, Bld: 98 mg/dL (ref 70–99)
Potassium: 3.8 mmol/L (ref 3.5–5.1)
Sodium: 142 mmol/L (ref 135–145)
Total Bilirubin: 1 mg/dL (ref 0.3–1.2)
Total Protein: 6.7 g/dL (ref 6.5–8.1)

## 2019-02-25 LAB — IRON AND TIBC
Iron: 68 ug/dL (ref 45–182)
Saturation Ratios: 23 % (ref 17.9–39.5)
TIBC: 302 ug/dL (ref 250–450)
UIBC: 234 ug/dL

## 2019-02-25 LAB — CBC WITH DIFFERENTIAL/PLATELET
Abs Immature Granulocytes: 0 10*3/uL (ref 0.00–0.07)
Basophils Absolute: 0 10*3/uL (ref 0.0–0.1)
Basophils Relative: 1 %
Eosinophils Absolute: 0 10*3/uL (ref 0.0–0.5)
Eosinophils Relative: 1 %
HCT: 33.5 % — ABNORMAL LOW (ref 39.0–52.0)
Hemoglobin: 11.1 g/dL — ABNORMAL LOW (ref 13.0–17.0)
Immature Granulocytes: 0 %
Lymphocytes Relative: 50 %
Lymphs Abs: 1.8 10*3/uL (ref 0.7–4.0)
MCH: 32 pg (ref 26.0–34.0)
MCHC: 33.1 g/dL (ref 30.0–36.0)
MCV: 96.5 fL (ref 80.0–100.0)
Monocytes Absolute: 0.3 10*3/uL (ref 0.1–1.0)
Monocytes Relative: 8 %
Neutro Abs: 1.5 10*3/uL — ABNORMAL LOW (ref 1.7–7.7)
Neutrophils Relative %: 40 %
Platelets: 182 10*3/uL (ref 150–400)
RBC: 3.47 MIL/uL — ABNORMAL LOW (ref 4.22–5.81)
RDW: 13.4 % (ref 11.5–15.5)
WBC: 3.6 10*3/uL — ABNORMAL LOW (ref 4.0–10.5)
nRBC: 0 % (ref 0.0–0.2)

## 2019-02-25 LAB — FOLATE: Folate: 19.1 ng/mL (ref >5.9)

## 2019-02-25 LAB — LACTATE DEHYDROGENASE: LDH: 147 U/L (ref 98–192)

## 2019-02-25 LAB — FERRITIN: Ferritin: 274 ng/mL (ref 24–336)

## 2019-02-25 LAB — VITAMIN D 25 HYDROXY (VIT D DEFICIENCY, FRACTURES): Vit D, 25-Hydroxy: 27.02 ng/mL — ABNORMAL LOW (ref 30–100)

## 2019-02-25 LAB — VITAMIN B12: Vitamin B-12: 283 pg/mL (ref 180–914)

## 2019-02-25 MED ORDER — DENOSUMAB 120 MG/1.7ML ~~LOC~~ SOLN
120.0000 mg | Freq: Once | SUBCUTANEOUS | Status: AC
  Administered 2019-02-25: 120 mg via SUBCUTANEOUS
  Filled 2019-02-25: qty 1.7

## 2019-02-25 NOTE — Assessment & Plan Note (Addendum)
1.  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 - He did report back pain and chest wall pain for 3 months prior which improved after orchiectomy. - He had a bone scan on 09/12/2017 which showed extensive abnormal uptake in the shoulders, sternum, ribs, throughout the spine, pelvis and proximal to mid femurs. - He had a CT of the abdomen and pelvis without contrast on 09/12/2017 which showed extensive retroperitoneal adenopathy, right hydronephrosis. - He underwent a prostate biopsy on 09/17/2017.  (Gleason 9) bilateral orchiectomy, cystoscopy with fulguration of bladder neck, and attempted right ureteral stent placement. - PSA on 09/24/2017 was 422 -he had 6 cycles of docetaxel chemotherapy from 11/19/2017 through 03/05/2018. -She had a repeat CT of abdomen and pelvis dated 03/25/2018 which showed interval reduction in size of pelvic lymph nodes which are now less than 10 mm in short axis.  No evidence of new adenopathy in the abdomen or pelvis.  Interval marked improvement in the right hydronephrosis.  Measurable volume reduction in the prostate gland. - PSA on 04/10/2018 was 4.52. -Bone scan dated 04/29/2018 showed widespread metastatic disease, mildly decrease in uptake with no new lesions. -Zytiga and prednisone 5 mg daily started on 05/07/2018.  He is tolerating this well.  Blood pressure is better and potassium is normal. - We will continue 1000mg  of Zytiga daily and 5 mg of prednisone daily. - PSA on 10/02/2018 it was 1.11. PSA on 12/25/2018 was 0.69. -Labs on 02/25/2019 showed his potassium 3.8, creatinine 1.59, AST 17 ALT 18, WBC 3.6, hemoglobin 11.1, platelets 182. -We will see him back in 2 month with labs.  2.  Bone metastasis: - Last dose of Xgeva was on 02/25/2019. -Labs on 02/25/2019 showed his calcium of 9.1 -He will get his dose of Xgeva today. -She continues to take her calcium supplements  daily. -He will continue to come monthly for his Xgeva injection and labs.  3.  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 -She has received intermittent Feraheme in the past. -Labs done on 02/25/2019 showed his hemoglobin 11.1, platelets 182, ferritin 274, percent saturation 23. - We do not recommend any replacement iron at this time. -We will recheck an iron panel in 2 month.

## 2019-02-25 NOTE — Progress Notes (Signed)
Patient tolerated injection with no complaints voiced.  Site clean and dry with no bruising or swelling noted at site.  Band aid applied.  Vss with discharge and left ambulatory with no s/s of distress noted.  

## 2019-02-25 NOTE — Progress Notes (Signed)
[place], [place] [address]   CLINIC:  Medical Oncology/Hematology  PCP:  Carlos Harper., PA-C [address] [place] [address] [phone]   REASON FOR VISIT: Follow-up for prostate cancer  CURRENT THERAPY: Zytiga and prednisone  BRIEF ONCOLOGIC HISTORY:  Oncology History  Malignant neoplasm of prostate ([place])  09/27/2017 Initial Diagnosis   Malignant neoplasm of prostate ([place])   11/19/2017 - 03/25/2018 Chemotherapy   The patient had pegfilgrastim (NEULASTA) injection 6 mg, 6 mg, Subcutaneous, Once, 6 of 6 cycles Administration: 6 mg (11/21/2017), 6 mg (12/12/2017), 6 mg (01/02/2018), 6 mg (01/24/2018), 6 mg (02/14/2018), 6 mg (03/07/2018) pegfilgrastim (NEULASTA ONPRO KIT) injection 6 mg, 6 mg, Subcutaneous, Once, 2 of 2 cycles DOCEtaxel (TAXOTERE) 140 mg in sodium chloride 0.9 % 250 mL chemo infusion, 75 mg/m2 = 140 mg, Intravenous,  Once, 6 of 6 cycles Administration: 140 mg (11/19/2017), 140 mg (12/10/2017), 140 mg (01/22/2018), 140 mg (12/31/2017), 140 mg (02/12/2018), 140 mg (03/05/2018) ondansetron (ZOFRAN) 8 mg, dexamethasone (DECADRON) 10 mg in sodium chloride 0.9 % 50 mL IVPB, , Intravenous,  Once, 4 of 4 cycles Administration:  (12/31/2017),  (01/22/2018),  (02/12/2018),  (03/05/2018)  for chemotherapy treatment.       INTERVAL HISTORY:  Carlos Harper 78 y.o. male returns for routine follow-up for prostate cancer.  Patient report he has been feeling well since his last visit.  He has no complaints at this time. Denies any nausea, vomiting, or diarrhea. Denies any new pains. Had not noticed any recent bleeding such as epistaxis, hematuria or hematochezia. Denies recent chest pain on exertion, shortness of breath on minimal exertion, pre-syncopal episodes, or palpitations. Denies any numbness or tingling in hands or feet. Denies any recent fevers, infections, or recent hospitalizations. Patient reports appetite at 100% and energy level  at 50%.  He is eating well maintain his weight this time.     REVIEW OF SYSTEMS:  Review of Systems  All other systems reviewed and are negative.    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   Sickle cell trait ([place])    Vertigo    Past Surgical History:  Procedure Laterality Date   CYSTOSCOPY WITH FULGERATION  09/17/2017   Procedure: CYSTOSCOPY WITH FULGERATION OF BLADDER NECK;  Surgeon: [name], MD;  Location: [facility];  Service: Urology;;   CYSTOSCOPY WITH STENT PLACEMENT Right 09/17/2017   Procedure: CYSTOSCOPY WITH RIGHT RETROGRADE PYELOGRAM ATTEMPTED STENT PLACEMENT;  Surgeon: [name], MD;  Location: [facility];  Service: Urology;  Laterality: Right;   left leg surgery due to [place] Bilateral 09/17/2017   Procedure: ORCHIECTOMY;  Surgeon: [name], MD;  Location: [facility];  Service: Urology;  Laterality: Bilateral;   PORTACATH PLACEMENT Left 10/14/2017   Procedure: INSERTION PORT-A-CATH;  Surgeon: Aviva Signs, MD;  Location: [facility];  Service: General;  Laterality: Left;   PROSTATE BIOPSY N/A 09/17/2017   Procedure: BIOPSY TRANSRECTAL ULTRASONIC PROSTATE (TUBP);  Surgeon: [name], MD;  Location: [facility];  Service: Urology;  Laterality: N/A;     SOCIAL HISTORY:  Social History   Socioeconomic History   Marital status: Single    Spouse name: Not on file   Number of children: Not on file   Years of education: Not on file   Highest education level:  Not on file  Occupational History   Not on file  Social Needs   Financial resource strain: Not on file   Food insecurity    Worry: Not on file    Inability: Not on file   Transportation needs    Medical: Not on file    Non-medical: Not on file  Tobacco Use   Smoking status: Never Smoker   Smokeless tobacco: Never Used  Substance and Sexual Activity    Alcohol use: Yes    Comment: Drinks beer occasionally    Drug use: No   Sexual activity: Not on file  Lifestyle   Physical activity    Days per week: Not on file    Minutes per session: Not on file   Stress: Not on file  Relationships   Social connections    Talks on phone: Not on file    Gets together: Not on file    Attends religious service: Not on file    Active member of club or organization: Not on file    Attends meetings of clubs or organizations: Not on file    Relationship status: Not on file   Intimate partner violence    Fear of current or ex partner: Not on file    Emotionally abused: Not on file    Physically abused: Not on file    Forced sexual activity: Not on file  Other Topics Concern   Not on file  Social History Narrative   Not on file    FAMILY HISTORY:  Family History  Problem Relation Age of Onset   Hypertension Mother    Lung cancer Father    Lung cancer Brother    HIV/AIDS Brother    Breast cancer Daughter     CURRENT MEDICATIONS:  Outpatient Encounter Medications as of 02/25/2019  Medication Sig   abiraterone acetate (ZYTIGA) 250 MG tablet TAKE 4 TABLETS (1,000 MG TOTAL) BY MOUTH DAILY. TAKE ON AN EMPTY STOMACH 1 HOUR BEFORE OR 2 HOURS AFTER A MEAL   docusate sodium (COLACE) 100 MG capsule Take 100 mg by mouth daily.   predniSONE (DELTASONE) 5 MG tablet Take 1 tablet (5 mg total) by mouth daily with breakfast.   prochlorperazine (COMPAZINE) 10 MG tablet Take 1 tablet (10 mg total) by mouth every 6 (six) hours as needed (Nausea or vomiting). (Patient not taking: Reported on 10/30/2018)   No facility-administered encounter medications on file as of 02/25/2019.     ALLERGIES:  No Known Allergies   PHYSICAL EXAM:  ECOG Performance status: 1  Vitals:   02/25/19 1014  BP: (!) 152/78  Pulse: 62  Resp: 18  Temp: (!) 97.5 F (36.4 C)  SpO2: 100%   Filed Weights   02/25/19 1014  Weight: 176 lb 9.6 oz (80.1 kg)     Physical Exam Constitutional:      Appearance: Normal appearance. He is normal weight.  Cardiovascular:     Rate and Rhythm: Normal rate and regular rhythm.     Heart sounds: Normal heart sounds.  Pulmonary:     Effort: Pulmonary effort is normal.     Breath sounds: Normal breath sounds.  Abdominal:     General: Bowel sounds are normal.     Palpations: Abdomen is soft.  Musculoskeletal: Normal range of motion.  Skin:    General: Skin is warm.  Neurological:     Mental Status: He is alert and oriented to person, place, and time. Mental status is at baseline.  Psychiatric:  Mood and Affect: Mood normal.        Behavior: Behavior normal.        Thought Content: Thought content normal.        Judgment: Judgment normal.      LABORATORY DATA:  I have reviewed the labs as listed.  CBC    Component Value Date/Time   WBC 3.6 (L) 02/25/2019 0940   RBC 3.47 (L) 02/25/2019 0940   HGB 11.1 (L) 02/25/2019 0940   HCT 33.5 (L) 02/25/2019 0940   PLT 182 02/25/2019 0940   MCV 96.5 02/25/2019 0940   MCH 32.0 02/25/2019 0940   MCHC 33.1 02/25/2019 0940   RDW 13.4 02/25/2019 0940   LYMPHSABS 1.8 02/25/2019 0940   MONOABS 0.3 02/25/2019 0940   EOSABS 0.0 02/25/2019 0940   BASOSABS 0.0 02/25/2019 0940   CMP Latest Ref Rng & Units 02/25/2019 01/28/2019 12/25/2018  Glucose 70 - 99 mg/dL 98 119(H) 111(H)  BUN 8 - 23 mg/dL 21 27(H) 24(H)  Creatinine 0.61 - 1.24 mg/dL 1.59(H) 1.43(H) 1.84(H)  Sodium 135 - 145 mmol/L 142 136 139  Potassium 3.5 - 5.1 mmol/L 3.8 3.8 3.9  Chloride 98 - 111 mmol/L 107 104 106  CO2 22 - 32 mmol/L '25 22 24  ' Calcium 8.9 - 10.3 mg/dL 9.1 9.0 8.8(L)  Total Protein 6.5 - 8.1 g/dL 6.7 6.9 7.1  Total Bilirubin 0.3 - 1.2 mg/dL 1.0 1.2 1.2  Alkaline Phos 38 - 126 U/L 45 47 44  AST 15 - 41 U/L '17 19 19  ' ALT 0 - 44 U/L '15 15 14      ' I personally performed a face-to-face visit, made revisions and my assessment and plan is as follows.  All questions were  answered to patient's stated satisfaction. Encouraged patient to call with any new concerns or questions before his next visit to the cancer center and we can certain see him sooner, if needed.     ASSESSMENT & PLAN:   Malignant neoplasm of prostate ([place]) 1.  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 - He did report back pain and chest wall pain for 3 months prior which improved after orchiectomy. - He had a bone scan on 09/12/2017 which showed extensive abnormal uptake in the shoulders, sternum, ribs, throughout the spine, pelvis and proximal to mid femurs. - He had a CT of the abdomen and pelvis without contrast on 09/12/2017 which showed extensive retroperitoneal adenopathy, right hydronephrosis. - He underwent a prostate biopsy on 09/17/2017.  (Gleason 9) bilateral orchiectomy, cystoscopy with fulguration of bladder neck, and attempted right ureteral stent placement. - PSA on 09/24/2017 was 422 -he had 6 cycles of docetaxel chemotherapy from 11/19/2017 through 03/05/2018. -She had a repeat CT of abdomen and pelvis dated 03/25/2018 which showed interval reduction in size of pelvic lymph nodes which are now less than 10 mm in short axis.  No evidence of new adenopathy in the abdomen or pelvis.  Interval marked improvement in the right hydronephrosis.  Measurable volume reduction in the prostate gland. - PSA on 04/10/2018 was 4.52. -Bone scan dated 04/29/2018 showed widespread metastatic disease, mildly decrease in uptake with no new lesions. -Zytiga and prednisone 5 mg daily started on 05/07/2018.  He is tolerating this well.  Blood pressure is better and potassium is normal. - We will continue 1047m of Zytiga daily and 5 mg of prednisone daily. -  PSA on 10/02/2018 it was 1.11. PSA on 12/25/2018 was 0.69. -Labs on 02/25/2019 showed his potassium 3.8, creatinine 1.59, AST 17 ALT 18, WBC 3.6, hemoglobin  11.1, platelets 182. -We will see him back in 2 month with labs.  2.  Bone metastasis: - Last dose of Xgeva was on 02/25/2019. -Labs on 02/25/2019 showed his calcium of 9.1 -He will get his dose of Xgeva today. -She continues to take her calcium supplements daily. -He will continue to come monthly for his Xgeva injection and labs.  3.  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 -She has received intermittent Feraheme in the past. -Labs done on 02/25/2019 showed his hemoglobin 11.1, platelets 182, ferritin 274, percent saturation 23. - We do not recommend any replacement iron at this time. -We will recheck an iron panel in 2 month.      Orders placed this encounter:  Orders Placed This Encounter  Procedures   CBC with Differential/Platelet   Comprehensive metabolic panel   Lactate dehydrogenase   CBC with Differential/Platelet   Comprehensive metabolic panel   Ferritin   Iron and TIBC   Magnesium   CBC with Differential/Platelet   Comprehensive metabolic panel   Vitamin C61   VITAMIN D 25 Hydroxy (Vit-D Deficiency, Fractures)      Francene Finders, FNP-C [place] [phone]

## 2019-02-25 NOTE — Patient Instructions (Signed)
[facility] at [facility] Discharge Instructions  Follow up in 2 months with labs    Thank you for choosing [facility] at [facility] to provide your oncology and hematology care.  To afford each patient quality time with our provider, please arrive at least 15 minutes before your scheduled appointment time.   If you have a lab appointment with the Cancer Center please come in thru the Main Entrance and check in at the main information desk.  You need to re-schedule your appointment should you arrive 10 or more minutes late.  We strive to give you quality time with our providers, and arriving late affects you and other patients whose appointments are after yours.  Also, if you no show three or more times for appointments you may be dismissed from the clinic at the providers discretion.     Again, thank you for choosing [facility].  Our hope is that these requests will decrease the amount of time that you wait before being seen by our physicians.       _____________________________________________________________  Should you have questions after your visit to [facility], please contact our office at [phone] between the hours of 8:00 a.m. and 4:30 p.m.  Voicemails left after 4:00 p.m. will not be returned until the following business day.  For prescription refill requests, have your pharmacy contact our office and allow 72 hours.    Due to Covid, you will need to wear a mask upon entering the hospital. If you do not have a mask, a mask will be given to you at the Main Entrance upon arrival. For doctor visits, patients may have 1 support person with them. For treatment visits, patients can not have anyone with them due to social distancing guidelines and our immunocompromised population.      

## 2019-02-26 LAB — PSA: Prostatic Specific Antigen: 0.52 ng/mL (ref 0.00–4.00)

## 2019-03-02 ENCOUNTER — Other Ambulatory Visit (HOSPITAL_COMMUNITY): Payer: Self-pay | Admitting: Hematology

## 2019-03-02 DIAGNOSIS — C61 Malignant neoplasm of prostate: Secondary | ICD-10-CM

## 2019-03-03 MED FILL — predniSONE 5 MG TABS: 5 | 30 days supply | Qty: 30 | Fill #2

## 2019-03-03 MED FILL — ABIRATERONE ACETATE 250 MG: 250 | 30 days supply | Qty: 120 | Fill #0

## 2019-03-25 ENCOUNTER — Inpatient Hospital Stay (HOSPITAL_COMMUNITY): Payer: Medicaid Other

## 2019-03-25 ENCOUNTER — Ambulatory Visit (HOSPITAL_COMMUNITY): Payer: Medicaid Other

## 2019-03-26 ENCOUNTER — Other Ambulatory Visit: Payer: Self-pay

## 2019-03-26 ENCOUNTER — Encounter (HOSPITAL_COMMUNITY): Payer: Self-pay

## 2019-03-26 ENCOUNTER — Inpatient Hospital Stay (HOSPITAL_COMMUNITY): Payer: Medicaid Other

## 2019-03-26 ENCOUNTER — Inpatient Hospital Stay (HOSPITAL_COMMUNITY): Payer: Medicaid Other | Attending: Hematology

## 2019-03-26 VITALS — BP 144/93 | HR 66 | Temp 97.1°F | Resp 18

## 2019-03-26 DIAGNOSIS — C7951 Secondary malignant neoplasm of bone: Secondary | ICD-10-CM | POA: Insufficient documentation

## 2019-03-26 DIAGNOSIS — C61 Malignant neoplasm of prostate: Secondary | ICD-10-CM | POA: Diagnosis present

## 2019-03-26 DIAGNOSIS — N189 Chronic kidney disease, unspecified: Secondary | ICD-10-CM | POA: Insufficient documentation

## 2019-03-26 DIAGNOSIS — I129 Hypertensive chronic kidney disease with stage 1 through stage 4 chronic kidney disease, or unspecified chronic kidney disease: Secondary | ICD-10-CM | POA: Diagnosis not present

## 2019-03-26 DIAGNOSIS — D631 Anemia in chronic kidney disease: Secondary | ICD-10-CM | POA: Diagnosis not present

## 2019-03-26 LAB — COMPREHENSIVE METABOLIC PANEL
ALT: 16 U/L (ref 0–44)
AST: 19 U/L (ref 15–41)
Albumin: 4 g/dL (ref 3.5–5.0)
Alkaline Phosphatase: 46 U/L (ref 38–126)
Anion gap: 8 (ref 5–15)
BUN: 26 mg/dL — ABNORMAL HIGH (ref 8–23)
CO2: 26 mmol/L (ref 22–32)
Calcium: 9.5 mg/dL (ref 8.9–10.3)
Chloride: 106 mmol/L (ref 98–111)
Creatinine, Ser: 1.6 mg/dL — ABNORMAL HIGH (ref 0.61–1.24)
GFR calc Af Amer: 47 mL/min — ABNORMAL LOW (ref >60)
GFR calc non Af Amer: 40 mL/min — ABNORMAL LOW (ref >60)
Glucose, Bld: 88 mg/dL (ref 70–99)
Potassium: 4.3 mmol/L (ref 3.5–5.1)
Sodium: 140 mmol/L (ref 135–145)
Total Bilirubin: 1.2 mg/dL (ref 0.3–1.2)
Total Protein: 7 g/dL (ref 6.5–8.1)

## 2019-03-26 MED ORDER — DENOSUMAB 120 MG/1.7ML ~~LOC~~ SOLN
120.0000 mg | Freq: Once | SUBCUTANEOUS | Status: AC
  Administered 2019-03-26: 120 mg via SUBCUTANEOUS
  Filled 2019-03-26: qty 1.7

## 2019-03-26 NOTE — Patient Instructions (Signed)
[facility] at [facility]  Discharge Instructions:   _______________________________________________________________  Thank you for choosing [facility] at [facility] to provide your oncology and hematology care.  To afford each patient quality time with our providers, please arrive at least 15 minutes before your scheduled appointment.  You need to re-schedule your appointment if you arrive 10 or more minutes late.  We strive to give you quality time with our providers, and arriving late affects you and other patients whose appointments are after yours.  Also, if you no show three or more times for appointments you may be dismissed from the clinic.  Again, thank you for choosing [facility] at [facility]. Our hope is that these requests will allow you access to exceptional care and in a timely manner. _______________________________________________________________  If you have questions after your visit, please contact our office at [phone] between the hours of 8:30 a.m. and 5:00 p.m. Voicemails left after 4:30 p.m. will not be returned until the following business day. _______________________________________________________________  For prescription refill requests, have your pharmacy contact our office. _______________________________________________________________  Recommendations made by the consultant and any test results will be sent to your referring physician. _______________________________________________________________ 

## 2019-03-30 ENCOUNTER — Other Ambulatory Visit (HOSPITAL_COMMUNITY): Payer: Self-pay | Admitting: Nurse Practitioner

## 2019-03-30 DIAGNOSIS — C61 Malignant neoplasm of prostate: Secondary | ICD-10-CM

## 2019-04-02 MED FILL — ABIRATERONE ACETATE 250 MG: 250 | 30 days supply | Qty: 120 | Fill #0

## 2019-04-02 MED FILL — predniSONE 5 MG TABS: 5 | 30 days supply | Qty: 30 | Fill #3

## 2019-04-22 ENCOUNTER — Inpatient Hospital Stay (HOSPITAL_COMMUNITY): Payer: Medicaid Other

## 2019-04-22 ENCOUNTER — Inpatient Hospital Stay (HOSPITAL_BASED_OUTPATIENT_CLINIC_OR_DEPARTMENT_OTHER): Payer: Medicaid Other | Admitting: Nurse Practitioner

## 2019-04-22 ENCOUNTER — Other Ambulatory Visit: Payer: Self-pay

## 2019-04-22 ENCOUNTER — Encounter (HOSPITAL_COMMUNITY): Payer: Self-pay | Admitting: Nurse Practitioner

## 2019-04-22 DIAGNOSIS — C61 Malignant neoplasm of prostate: Secondary | ICD-10-CM | POA: Diagnosis not present

## 2019-04-22 LAB — CBC WITH DIFFERENTIAL/PLATELET
Abs Immature Granulocytes: 0.01 10*3/uL (ref 0.00–0.07)
Basophils Absolute: 0 10*3/uL (ref 0.0–0.1)
Basophils Relative: 1 %
Eosinophils Absolute: 0 10*3/uL (ref 0.0–0.5)
Eosinophils Relative: 1 %
HCT: 38.9 % — ABNORMAL LOW (ref 39.0–52.0)
Hemoglobin: 12.6 g/dL — ABNORMAL LOW (ref 13.0–17.0)
Immature Granulocytes: 0 %
Lymphocytes Relative: 52 %
Lymphs Abs: 2 10*3/uL (ref 0.7–4.0)
MCH: 31.5 pg (ref 26.0–34.0)
MCHC: 32.4 g/dL (ref 30.0–36.0)
MCV: 97.3 fL (ref 80.0–100.0)
Monocytes Absolute: 0.3 10*3/uL (ref 0.1–1.0)
Monocytes Relative: 7 %
Neutro Abs: 1.5 10*3/uL — ABNORMAL LOW (ref 1.7–7.7)
Neutrophils Relative %: 39 %
Platelets: 190 10*3/uL (ref 150–400)
RBC: 4 MIL/uL — ABNORMAL LOW (ref 4.22–5.81)
RDW: 13.2 % (ref 11.5–15.5)
WBC: 3.8 10*3/uL — ABNORMAL LOW (ref 4.0–10.5)
nRBC: 0 % (ref 0.0–0.2)

## 2019-04-22 LAB — MAGNESIUM: Magnesium: 2.3 mg/dL (ref 1.7–2.4)

## 2019-04-22 LAB — COMPREHENSIVE METABOLIC PANEL
ALT: 15 U/L (ref 0–44)
AST: 20 U/L (ref 15–41)
Albumin: 3.9 g/dL (ref 3.5–5.0)
Alkaline Phosphatase: 40 U/L (ref 38–126)
Anion gap: 11 (ref 5–15)
BUN: 26 mg/dL — ABNORMAL HIGH (ref 8–23)
CO2: 24 mmol/L (ref 22–32)
Calcium: 9.1 mg/dL (ref 8.9–10.3)
Chloride: 107 mmol/L (ref 98–111)
Creatinine, Ser: 1.65 mg/dL — ABNORMAL HIGH (ref 0.61–1.24)
GFR calc Af Amer: 45 mL/min — ABNORMAL LOW (ref >60)
GFR calc non Af Amer: 39 mL/min — ABNORMAL LOW (ref >60)
Glucose, Bld: 83 mg/dL (ref 70–99)
Potassium: 3.6 mmol/L (ref 3.5–5.1)
Sodium: 142 mmol/L (ref 135–145)
Total Bilirubin: 1.1 mg/dL (ref 0.3–1.2)
Total Protein: 7 g/dL (ref 6.5–8.1)

## 2019-04-22 LAB — IRON AND TIBC
Iron: 70 ug/dL (ref 45–182)
Saturation Ratios: 21 % (ref 17.9–39.5)
TIBC: 328 ug/dL (ref 250–450)
UIBC: 258 ug/dL

## 2019-04-22 LAB — FERRITIN: Ferritin: 245 ng/mL (ref 24–336)

## 2019-04-22 LAB — VITAMIN D 25 HYDROXY (VIT D DEFICIENCY, FRACTURES): Vit D, 25-Hydroxy: 31.71 ng/mL (ref 30–100)

## 2019-04-22 LAB — VITAMIN B12: Vitamin B-12: 251 pg/mL (ref 180–914)

## 2019-04-22 LAB — LACTATE DEHYDROGENASE: LDH: 145 U/L (ref 98–192)

## 2019-04-22 MED ORDER — DENOSUMAB 120 MG/1.7ML ~~LOC~~ SOLN
120.0000 mg | Freq: Once | SUBCUTANEOUS | Status: AC
  Administered 2019-04-22: 120 mg via SUBCUTANEOUS

## 2019-04-22 MED ORDER — CYANOCOBALAMIN 1000 MCG/ML IJ SOLN
INTRAMUSCULAR | Status: AC
  Filled 2019-04-22: qty 2

## 2019-04-22 MED ORDER — DENOSUMAB 120 MG/1.7ML ~~LOC~~ SOLN
SUBCUTANEOUS | Status: AC
  Filled 2019-04-22: qty 1.7

## 2019-04-22 NOTE — Assessment & Plan Note (Addendum)
1.  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 - He did report back pain and chest wall pain for 3 months prior which improved after orchiectomy. - He had a bone scan on 09/12/2017 which showed extensive abnormal uptake in the shoulders, sternum, ribs, throughout the spine, pelvis and proximal to mid femurs. - He had a CT of the abdomen and pelvis without contrast on 09/12/2017 which showed extensive retroperitoneal adenopathy, right hydronephrosis. - He underwent a prostate biopsy on 09/17/2017.  (Gleason 9) bilateral orchiectomy, cystoscopy with fulguration of bladder neck, and attempted right ureteral stent placement. - PSA on 09/24/2017 was 422 -he had 6 cycles of docetaxel chemotherapy from 11/19/2017 through 03/05/2018. -She had a repeat CT of abdomen and pelvis dated 03/25/2018 which showed interval reduction in size of pelvic lymph nodes which are now less than 10 mm in short axis.  No evidence of new adenopathy in the abdomen or pelvis.  Interval marked improvement in the right hydronephrosis.  Measurable volume reduction in the prostate gland. - PSA on 04/10/2018 was 4.52. -Bone scan dated 04/29/2018 showed widespread metastatic disease, mildly decrease in uptake with no new lesions. -Zytiga and prednisone 5 mg daily started on 05/07/2018.  He is tolerating this well.  Blood pressure is better and potassium is normal. - We will continue 1000mg  of Zytiga daily and 5 mg of prednisone daily. - PSA on 10/02/2018 it was 1.11. PSA on 02/25/2019 was 0.59. -Labs on 04/22/2019 showed his potassium 3.6, creatinine 1.65, AST 20 ALT 15, WBC 3.8, hemoglobin 12.6, platelets 190. -We will see him back in 2 month with labs.  2.  Bone metastasis: - Last dose of Xgeva was on 03/26/2019. -Labs on 04/22/2019 showed his calcium of 9.1 -He will get his dose of Xgeva today. -She continues to take her calcium  supplements daily. -He will continue to come monthly for his Xgeva injection and labs.  3.  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 -He has received intermittent Feraheme in the past. -Labs done on 04/22/2019 showed his hemoglobin 12.6, platelets 190, ferritin 245 , percent saturation 21 . - We do not recommend any replacement iron at this time. -We will recheck an iron panel in 2 month.

## 2019-04-22 NOTE — Patient Instructions (Signed)
[facility] at [facility] Discharge Instructions  Follow up in 2 months with labs    Thank you for choosing [facility] at [facility] to provide your oncology and hematology care.  To afford each patient quality time with our provider, please arrive at least 15 minutes before your scheduled appointment time.   If you have a lab appointment with the Cancer Center please come in thru the Main Entrance and check in at the main information desk.  You need to re-schedule your appointment should you arrive 10 or more minutes late.  We strive to give you quality time with our providers, and arriving late affects you and other patients whose appointments are after yours.  Also, if you no show three or more times for appointments you may be dismissed from the clinic at the providers discretion.     Again, thank you for choosing [facility].  Our hope is that these requests will decrease the amount of time that you wait before being seen by our physicians.       _____________________________________________________________  Should you have questions after your visit to [facility], please contact our office at [phone] between the hours of 8:00 a.m. and 4:30 p.m.  Voicemails left after 4:00 p.m. will not be returned until the following business day.  For prescription refill requests, have your pharmacy contact our office and allow 72 hours.    Due to Covid, you will need to wear a mask upon entering the hospital. If you do not have a mask, a mask will be given to you at the Main Entrance upon arrival. For doctor visits, patients may have 1 support person with them. For treatment visits, patients can not have anyone with them due to social distancing guidelines and our immunocompromised population.      
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
## 2019-05-01 ENCOUNTER — Other Ambulatory Visit (HOSPITAL_COMMUNITY): Payer: Self-pay | Admitting: Nurse Practitioner

## 2019-05-01 DIAGNOSIS — C61 Malignant neoplasm of prostate: Secondary | ICD-10-CM

## 2019-05-04 MED FILL — ABIRATERONE ACETATE 250 MG: 250 | 30 days supply | Qty: 120 | Fill #0

## 2019-05-04 MED FILL — predniSONE 5 MG TABS: 5 | 30 days supply | Qty: 30 | Fill #4

## 2019-05-20 ENCOUNTER — Other Ambulatory Visit: Payer: Self-pay

## 2019-05-20 ENCOUNTER — Inpatient Hospital Stay (HOSPITAL_COMMUNITY): Payer: Medicaid Other

## 2019-05-20 ENCOUNTER — Encounter (HOSPITAL_COMMUNITY): Payer: Self-pay

## 2019-05-20 ENCOUNTER — Inpatient Hospital Stay (HOSPITAL_COMMUNITY): Payer: Medicaid Other | Attending: Hematology

## 2019-05-20 VITALS — BP 159/81 | HR 62 | Temp 97.1°F | Resp 18

## 2019-05-20 DIAGNOSIS — C61 Malignant neoplasm of prostate: Secondary | ICD-10-CM | POA: Diagnosis not present

## 2019-05-20 DIAGNOSIS — C7951 Secondary malignant neoplasm of bone: Secondary | ICD-10-CM | POA: Diagnosis present

## 2019-05-20 LAB — COMPREHENSIVE METABOLIC PANEL
ALT: 17 U/L (ref 0–44)
AST: 21 U/L (ref 15–41)
Albumin: 4.1 g/dL (ref 3.5–5.0)
Alkaline Phosphatase: 42 U/L (ref 38–126)
Anion gap: 9 (ref 5–15)
BUN: 20 mg/dL (ref 8–23)
CO2: 26 mmol/L (ref 22–32)
Calcium: 9.3 mg/dL (ref 8.9–10.3)
Chloride: 105 mmol/L (ref 98–111)
Creatinine, Ser: 1.63 mg/dL — ABNORMAL HIGH (ref 0.61–1.24)
GFR calc Af Amer: 46 mL/min — ABNORMAL LOW (ref >60)
GFR calc non Af Amer: 39 mL/min — ABNORMAL LOW (ref >60)
Glucose, Bld: 99 mg/dL (ref 70–99)
Potassium: 4.2 mmol/L (ref 3.5–5.1)
Sodium: 140 mmol/L (ref 135–145)
Total Bilirubin: 1.5 mg/dL — ABNORMAL HIGH (ref 0.3–1.2)
Total Protein: 7.3 g/dL (ref 6.5–8.1)

## 2019-05-20 MED ORDER — DENOSUMAB 120 MG/1.7ML ~~LOC~~ SOLN
120.0000 mg | Freq: Once | SUBCUTANEOUS | Status: AC
  Administered 2019-05-20: 120 mg via SUBCUTANEOUS
  Filled 2019-05-20: qty 1.7

## 2019-05-20 MED ORDER — ONDANSETRON HCL 4 MG/2ML IJ SOLN
INTRAMUSCULAR | Status: AC
  Filled 2019-05-20: qty 2

## 2019-05-20 NOTE — Progress Notes (Signed)
Carlos Harper tolerated Xgeva injection well without complaints or incident. Calcium 9.3 today and pt denied any tooth or jaw pain and no recent or future dental visits prior to administering this medication. VSS Pt discharged self ambulatory in satisfactory condition

## 2019-05-20 NOTE — Patient Instructions (Signed)
[facility] at [facility] Discharge Instructions Received Xgeva injection today. Follow-up as scheduled. Call clinic for any questions or concerns   Thank you for choosing [facility] at [facility] to provide your oncology and hematology care.  To afford each patient quality time with our provider, please arrive at least 15 minutes before your scheduled appointment time.   If you have a lab appointment with the Cancer Center please come in thru the Main Entrance and check in at the main information desk.  You need to re-schedule your appointment should you arrive 10 or more minutes late.  We strive to give you quality time with our providers, and arriving late affects you and other patients whose appointments are after yours.  Also, if you no show three or more times for appointments you may be dismissed from the clinic at the providers discretion.     Again, thank you for choosing [facility].  Our hope is that these requests will decrease the amount of time that you wait before being seen by our physicians.       _____________________________________________________________  Should you have questions after your visit to [facility], please contact our office at [phone] between the hours of 8:00 a.m. and 4:30 p.m.  Voicemails left after 4:00 p.m. will not be returned until the following business day.  For prescription refill requests, have your pharmacy contact our office and allow 72 hours.    Due to Covid, you will need to wear a mask upon entering the hospital. If you do not have a mask, a mask will be given to you at the Main Entrance upon arrival. For doctor visits, patients may have 1 support person with them. For treatment visits, patients can not have anyone with them due to social distancing guidelines and our immunocompromised population.     

## 2019-05-29 ENCOUNTER — Other Ambulatory Visit (HOSPITAL_COMMUNITY): Payer: Self-pay | Admitting: Nurse Practitioner

## 2019-05-29 DIAGNOSIS — C61 Malignant neoplasm of prostate: Secondary | ICD-10-CM

## 2019-06-02 ENCOUNTER — Other Ambulatory Visit (HOSPITAL_COMMUNITY): Payer: Self-pay | Admitting: *Deleted

## 2019-06-02 DIAGNOSIS — C61 Malignant neoplasm of prostate: Secondary | ICD-10-CM

## 2019-06-02 MED ORDER — ABIRATERONE ACETATE 250 MG PO TABS: 1000.0000 mg | ORAL_TABLET | Freq: Every day | ORAL | 0 refills | Status: DC

## 2019-06-03 MED FILL — ABIRATERONE ACETATE 250 MG: 250 | 30 days supply | Qty: 120 | Fill #0

## 2019-06-03 MED FILL — predniSONE 5 MG TABS: 5 | 30 days supply | Qty: 30 | Fill #5

## 2019-06-17 ENCOUNTER — Inpatient Hospital Stay (HOSPITAL_COMMUNITY): Payer: Medicaid Other | Attending: Hematology

## 2019-06-17 ENCOUNTER — Other Ambulatory Visit: Payer: Self-pay

## 2019-06-17 ENCOUNTER — Inpatient Hospital Stay (HOSPITAL_BASED_OUTPATIENT_CLINIC_OR_DEPARTMENT_OTHER): Payer: Medicaid Other | Admitting: Hematology

## 2019-06-17 ENCOUNTER — Inpatient Hospital Stay (HOSPITAL_COMMUNITY): Payer: Medicaid Other

## 2019-06-17 ENCOUNTER — Other Ambulatory Visit (HOSPITAL_COMMUNITY): Payer: Self-pay | Admitting: *Deleted

## 2019-06-17 ENCOUNTER — Encounter (HOSPITAL_COMMUNITY): Payer: Self-pay | Admitting: Hematology

## 2019-06-17 DIAGNOSIS — C61 Malignant neoplasm of prostate: Secondary | ICD-10-CM | POA: Insufficient documentation

## 2019-06-17 DIAGNOSIS — N189 Chronic kidney disease, unspecified: Secondary | ICD-10-CM | POA: Insufficient documentation

## 2019-06-17 DIAGNOSIS — D631 Anemia in chronic kidney disease: Secondary | ICD-10-CM | POA: Diagnosis not present

## 2019-06-17 DIAGNOSIS — C7951 Secondary malignant neoplasm of bone: Secondary | ICD-10-CM | POA: Insufficient documentation

## 2019-06-17 DIAGNOSIS — M549 Dorsalgia, unspecified: Secondary | ICD-10-CM | POA: Insufficient documentation

## 2019-06-17 LAB — CBC WITH DIFFERENTIAL/PLATELET
Abs Immature Granulocytes: 0.01 10*3/uL (ref 0.00–0.07)
Basophils Absolute: 0 10*3/uL (ref 0.0–0.1)
Basophils Relative: 1 %
Eosinophils Absolute: 0 10*3/uL (ref 0.0–0.5)
Eosinophils Relative: 0 %
HCT: 35.9 % — ABNORMAL LOW (ref 39.0–52.0)
Hemoglobin: 12.1 g/dL — ABNORMAL LOW (ref 13.0–17.0)
Immature Granulocytes: 0 %
Lymphocytes Relative: 45 %
Lymphs Abs: 2 10*3/uL (ref 0.7–4.0)
MCH: 32.2 pg (ref 26.0–34.0)
MCHC: 33.7 g/dL (ref 30.0–36.0)
MCV: 95.5 fL (ref 80.0–100.0)
Monocytes Absolute: 0.3 10*3/uL (ref 0.1–1.0)
Monocytes Relative: 7 %
Neutro Abs: 2.2 10*3/uL (ref 1.7–7.7)
Neutrophils Relative %: 47 %
Platelets: 186 10*3/uL (ref 150–400)
RBC: 3.76 MIL/uL — ABNORMAL LOW (ref 4.22–5.81)
RDW: 13.4 % (ref 11.5–15.5)
WBC: 4.6 10*3/uL (ref 4.0–10.5)
nRBC: 0 % (ref 0.0–0.2)

## 2019-06-17 LAB — COMPREHENSIVE METABOLIC PANEL
ALT: 15 U/L (ref 0–44)
AST: 16 U/L (ref 15–41)
Albumin: 4 g/dL (ref 3.5–5.0)
Alkaline Phosphatase: 44 U/L (ref 38–126)
Anion gap: 8 (ref 5–15)
BUN: 22 mg/dL (ref 8–23)
CO2: 27 mmol/L (ref 22–32)
Calcium: 9 mg/dL (ref 8.9–10.3)
Chloride: 104 mmol/L (ref 98–111)
Creatinine, Ser: 1.48 mg/dL — ABNORMAL HIGH (ref 0.61–1.24)
GFR calc Af Amer: 51 mL/min — ABNORMAL LOW (ref >60)
GFR calc non Af Amer: 44 mL/min — ABNORMAL LOW (ref >60)
Glucose, Bld: 112 mg/dL — ABNORMAL HIGH (ref 70–99)
Potassium: 3.4 mmol/L — ABNORMAL LOW (ref 3.5–5.1)
Sodium: 139 mmol/L (ref 135–145)
Total Bilirubin: 1.1 mg/dL (ref 0.3–1.2)
Total Protein: 7 g/dL (ref 6.5–8.1)

## 2019-06-17 LAB — PSA: Prostatic Specific Antigen: 0.32 ng/mL (ref 0.00–4.00)

## 2019-06-17 LAB — LACTATE DEHYDROGENASE: LDH: 136 U/L (ref 98–192)

## 2019-06-17 MED ORDER — TRAMADOL HCL 50 MG PO TABS: 50.0000 mg | ORAL_TABLET | Freq: Two times a day (BID) | ORAL | 0 refills | Status: DC | PRN

## 2019-06-17 MED ORDER — DENOSUMAB 120 MG/1.7ML ~~LOC~~ SOLN
120.0000 mg | Freq: Once | SUBCUTANEOUS | Status: AC
  Administered 2019-06-17: 120 mg via SUBCUTANEOUS
  Filled 2019-06-17: qty 1.7

## 2019-06-17 NOTE — Progress Notes (Signed)
Patient tolerated Xgeva injection with no complaints voiced.  Denied tooth and jaw pain.  Taking calcium as directed.  Site clean and dry with no bruising or swelling noted at site.  Band aid applied.  Vss with discharge and left ambulatory with no s/s of distress noted.

## 2019-06-17 NOTE — Patient Instructions (Addendum)
[facility] at [facility] Discharge Instructions  You were seen today by Dr. Delton Coombes. He went over your recent lab results. He will send in a prescription for tramadol to help with the pain. If the pain is not better please call us and let us know. Continue injections. He will see you back in 2 months for labs and follow up.   Thank you for choosing [facility] at [facility] to provide your oncology and hematology care.  To afford each patient quality time with our provider, please arrive at least 15 minutes before your scheduled appointment time.   If you have a lab appointment with the [facility] please come in thru the  Main Entrance and check in at the main information desk  You need to re-schedule your appointment should you arrive 10 or more minutes late.  We strive to give you quality time with our providers, and arriving late affects you and other patients whose appointments are after yours.  Also, if you no show three or more times for appointments you may be dismissed from the clinic at the providers discretion.     Again, thank you for choosing [facility].  Our hope is that these requests will decrease the amount of time that you wait before being seen by our physicians.       _____________________________________________________________  Should you have questions after your visit to [facility], please contact our office at [phone] between the hours of 8:00 a.m. and 4:30 p.m.  Voicemails left after 4:00 p.m. will not be returned until the following business day.  For prescription refill requests, have your pharmacy contact our office and allow 72 hours.    Cancer Center Support Programs:   > Cancer Support Group  2nd Tuesday of the month 1pm-2pm, Journey Room

## 2019-06-17 NOTE — Assessment & Plan Note (Signed)
1.  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 -6 cycles of docetaxel from 11/19/2017 through 03/05/2018. -Zytiga and prednisone 5 mg daily started on 05/07/2018.  PSA at the start was 4.52. -Latest PSA on 02/25/2019 was 0.52.  PSA from today is pending. -He is tolerating Zytiga reasonably well.  We reviewed CBC which was grossly normal.  LFTs were normal. -We will continue Zytiga as long as PSA trends down.  I will see him back in 2 months for follow-up.  2.  Bone metastasis: -His calcium is 9.  He cannot take oral calcium secondary to constipation. -He drinks orange juice with calcium in it. -He may proceed with denosumab today.  3.  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 -Last Feraheme was on 01/22/2018. -CBC today shows hemoglobin 12.1.  Creatinine is 1.48, improved from 1.6 previously.  4.  Back pain: -He reported back pain 10 days ago when he was trying to crank generator with his right arm. -Back pain is in the mid back and across.  Its not going down the extremity. -I reviewed his previous bone scan which showed some activity in the thoracic spine. -He reports that pain has slightly gotten better.  I will give him tramadol 50 mg to be taken at bedtime as needed. -He was told to call us if the pain does not get better in the next 2 weeks.  We will order a scan at that time.
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
## 2019-06-26 MED FILL — ABIRATERONE ACETATE 250 MG: 250 | 30 days supply | Qty: 120 | Fill #0

## 2019-06-30 ENCOUNTER — Encounter (HOSPITAL_COMMUNITY): Payer: Self-pay | Admitting: Nurse Practitioner

## 2019-07-15 ENCOUNTER — Inpatient Hospital Stay (HOSPITAL_COMMUNITY): Payer: Medicaid Other

## 2019-07-15 ENCOUNTER — Other Ambulatory Visit (HOSPITAL_COMMUNITY): Payer: Self-pay | Admitting: Surgery

## 2019-07-15 ENCOUNTER — Other Ambulatory Visit: Payer: Self-pay

## 2019-07-15 ENCOUNTER — Inpatient Hospital Stay (HOSPITAL_COMMUNITY): Payer: Medicaid Other | Attending: Hematology

## 2019-07-15 ENCOUNTER — Encounter (HOSPITAL_COMMUNITY): Payer: Self-pay

## 2019-07-15 VITALS — BP 156/75 | HR 71 | Temp 96.4°F | Resp 16

## 2019-07-15 DIAGNOSIS — C7951 Secondary malignant neoplasm of bone: Secondary | ICD-10-CM | POA: Insufficient documentation

## 2019-07-15 DIAGNOSIS — C61 Malignant neoplasm of prostate: Secondary | ICD-10-CM

## 2019-07-15 DIAGNOSIS — Z9221 Personal history of antineoplastic chemotherapy: Secondary | ICD-10-CM | POA: Diagnosis not present

## 2019-07-15 DIAGNOSIS — D649 Anemia, unspecified: Secondary | ICD-10-CM | POA: Diagnosis not present

## 2019-07-15 LAB — COMPREHENSIVE METABOLIC PANEL
ALT: 14 U/L (ref 0–44)
AST: 21 U/L (ref 15–41)
Albumin: 4.1 g/dL (ref 3.5–5.0)
Alkaline Phosphatase: 48 U/L (ref 38–126)
Anion gap: 9 (ref 5–15)
BUN: 22 mg/dL (ref 8–23)
CO2: 26 mmol/L (ref 22–32)
Calcium: 9.2 mg/dL (ref 8.9–10.3)
Chloride: 105 mmol/L (ref 98–111)
Creatinine, Ser: 1.59 mg/dL — ABNORMAL HIGH (ref 0.61–1.24)
GFR calc Af Amer: 47 mL/min — ABNORMAL LOW (ref >60)
GFR calc non Af Amer: 41 mL/min — ABNORMAL LOW (ref >60)
Glucose, Bld: 86 mg/dL (ref 70–99)
Potassium: 3.3 mmol/L — ABNORMAL LOW (ref 3.5–5.1)
Sodium: 140 mmol/L (ref 135–145)
Total Bilirubin: 1.3 mg/dL — ABNORMAL HIGH (ref 0.3–1.2)
Total Protein: 7.2 g/dL (ref 6.5–8.1)

## 2019-07-15 MED ORDER — PREDNISONE 5 MG PO TABS: 5.0000 mg | ORAL_TABLET | Freq: Every day | ORAL | 6 refills | Status: DC

## 2019-07-15 MED ORDER — DENOSUMAB 120 MG/1.7ML ~~LOC~~ SOLN
120.0000 mg | Freq: Once | SUBCUTANEOUS | Status: AC
  Administered 2019-07-15: 10:00:00 120 mg via SUBCUTANEOUS
  Filled 2019-07-15: qty 1.7

## 2019-07-15 NOTE — Progress Notes (Signed)
Patient not taking calcium due to constipation, but is drinking orange juice with calcium.  Denied tooth, jaw, and leg pain.  No recent or upcoming dental visits.  Patient tolerated injection with no complaints.  Site clean and dry with no bruising or swelling noted at site.  Band aid applied.  VSS with discharge and left ambulatory with no s/s of distress noted.

## 2019-07-27 ENCOUNTER — Ambulatory Visit: Payer: Medicaid Other | Attending: Internal Medicine

## 2019-07-27 DIAGNOSIS — Z23 Encounter for immunization: Secondary | ICD-10-CM

## 2019-07-27 NOTE — Progress Notes (Signed)
   Covid-19 Vaccination Clinic  Name:  Fher Wyndham    MRN: [id number] DOB: 05/17/1940  07/27/2019  Mr. Kichline was observed post Covid-19 immunization for 15 minutes without incident. He was provided with Vaccine Information Sheet and instruction to access the V-Safe system.   Mr. Griepentrog was instructed to call 911 with any severe reactions post vaccine: Marland Kitchen Difficulty breathing  . Swelling of face and throat  . A fast heartbeat  . A bad rash all over body  . Dizziness and weakness   Immunizations Administered    Name Date Dose VIS Date Route   Pfizer COVID-19 Vaccine 07/27/2019 12:16 PM 0.3 mL 04/03/2019 Intramuscular   Manufacturer: [place]   Lot: [id number]   [place]: [id number]

## 2019-07-29 ENCOUNTER — Other Ambulatory Visit (HOSPITAL_COMMUNITY): Payer: Self-pay | Admitting: Nurse Practitioner

## 2019-07-29 DIAGNOSIS — C61 Malignant neoplasm of prostate: Secondary | ICD-10-CM

## 2019-07-30 MED FILL — ABIRATERONE ACETATE 250 MG: 250 | 30 days supply | Qty: 120 | Fill #0

## 2019-08-12 ENCOUNTER — Encounter (HOSPITAL_COMMUNITY): Payer: Self-pay | Admitting: Hematology

## 2019-08-12 ENCOUNTER — Encounter (HOSPITAL_COMMUNITY): Payer: Self-pay

## 2019-08-12 ENCOUNTER — Other Ambulatory Visit: Payer: Self-pay

## 2019-08-12 ENCOUNTER — Inpatient Hospital Stay (HOSPITAL_COMMUNITY): Payer: Medicaid Other

## 2019-08-12 ENCOUNTER — Inpatient Hospital Stay (HOSPITAL_BASED_OUTPATIENT_CLINIC_OR_DEPARTMENT_OTHER): Payer: Medicaid Other | Admitting: Hematology

## 2019-08-12 ENCOUNTER — Inpatient Hospital Stay (HOSPITAL_COMMUNITY): Payer: Medicaid Other | Attending: Hematology

## 2019-08-12 VITALS — BP 139/75 | HR 72 | Temp 96.9°F | Resp 18 | Wt 180.6 lb

## 2019-08-12 VITALS — BP 139/75 | HR 72 | Temp 96.9°F | Resp 18

## 2019-08-12 DIAGNOSIS — N189 Chronic kidney disease, unspecified: Secondary | ICD-10-CM | POA: Diagnosis not present

## 2019-08-12 DIAGNOSIS — C61 Malignant neoplasm of prostate: Secondary | ICD-10-CM

## 2019-08-12 DIAGNOSIS — C7951 Secondary malignant neoplasm of bone: Secondary | ICD-10-CM | POA: Insufficient documentation

## 2019-08-12 DIAGNOSIS — I129 Hypertensive chronic kidney disease with stage 1 through stage 4 chronic kidney disease, or unspecified chronic kidney disease: Secondary | ICD-10-CM | POA: Diagnosis not present

## 2019-08-12 DIAGNOSIS — D631 Anemia in chronic kidney disease: Secondary | ICD-10-CM | POA: Insufficient documentation

## 2019-08-12 LAB — CBC WITH DIFFERENTIAL/PLATELET
Abs Immature Granulocytes: 0.01 10*3/uL (ref 0.00–0.07)
Basophils Absolute: 0 10*3/uL (ref 0.0–0.1)
Basophils Relative: 0 %
Eosinophils Absolute: 0 10*3/uL (ref 0.0–0.5)
Eosinophils Relative: 1 %
HCT: 36.5 % — ABNORMAL LOW (ref 39.0–52.0)
Hemoglobin: 12 g/dL — ABNORMAL LOW (ref 13.0–17.0)
Immature Granulocytes: 0 %
Lymphocytes Relative: 31 %
Lymphs Abs: 1.7 10*3/uL (ref 0.7–4.0)
MCH: 31.6 pg (ref 26.0–34.0)
MCHC: 32.9 g/dL (ref 30.0–36.0)
MCV: 96.1 fL (ref 80.0–100.0)
Monocytes Absolute: 0.4 10*3/uL (ref 0.1–1.0)
Monocytes Relative: 8 %
Neutro Abs: 3.3 10*3/uL (ref 1.7–7.7)
Neutrophils Relative %: 60 %
Platelets: 198 10*3/uL (ref 150–400)
RBC: 3.8 MIL/uL — ABNORMAL LOW (ref 4.22–5.81)
RDW: 13.6 % (ref 11.5–15.5)
WBC: 5.5 10*3/uL (ref 4.0–10.5)
nRBC: 0 % (ref 0.0–0.2)

## 2019-08-12 LAB — COMPREHENSIVE METABOLIC PANEL
ALT: 15 U/L (ref 0–44)
AST: 17 U/L (ref 15–41)
Albumin: 4 g/dL (ref 3.5–5.0)
Alkaline Phosphatase: 47 U/L (ref 38–126)
Anion gap: 9 (ref 5–15)
BUN: 22 mg/dL (ref 8–23)
CO2: 28 mmol/L (ref 22–32)
Calcium: 9.1 mg/dL (ref 8.9–10.3)
Chloride: 104 mmol/L (ref 98–111)
Creatinine, Ser: 1.65 mg/dL — ABNORMAL HIGH (ref 0.61–1.24)
GFR calc Af Amer: 45 mL/min — ABNORMAL LOW (ref >60)
GFR calc non Af Amer: 39 mL/min — ABNORMAL LOW (ref >60)
Glucose, Bld: 90 mg/dL (ref 70–99)
Potassium: 3.8 mmol/L (ref 3.5–5.1)
Sodium: 141 mmol/L (ref 135–145)
Total Bilirubin: 1.1 mg/dL (ref 0.3–1.2)
Total Protein: 7 g/dL (ref 6.5–8.1)

## 2019-08-12 LAB — FERRITIN: Ferritin: 269 ng/mL (ref 24–336)

## 2019-08-12 LAB — IRON AND TIBC
Iron: 91 ug/dL (ref 45–182)
Saturation Ratios: 28 % (ref 17.9–39.5)
TIBC: 320 ug/dL (ref 250–450)
UIBC: 229 ug/dL

## 2019-08-12 LAB — FOLATE: Folate: 16.5 ng/mL (ref >5.9)

## 2019-08-12 LAB — PSA: Prostatic Specific Antigen: 0.26 ng/mL (ref 0.00–4.00)

## 2019-08-12 LAB — VITAMIN B12: Vitamin B-12: 690 pg/mL (ref 180–914)

## 2019-08-12 MED ORDER — DENOSUMAB 120 MG/1.7ML ~~LOC~~ SOLN
120.0000 mg | Freq: Once | SUBCUTANEOUS | Status: AC
  Administered 2019-08-12: 11:00:00 120 mg via SUBCUTANEOUS
  Filled 2019-08-12: qty 1.7

## 2019-08-12 NOTE — Progress Notes (Signed)
Patient taking calcium as directed.  Denied tooth, jaw, and leg pain.  No recent or upcoming dental visits.  Labs reviewed.  Patient tolerated injection with no complaints voiced.  See MAR for details.  Site clean and dry with no bruising or swelling noted.  Band aid applied.  Vss with discharge and left ambulatory with no s/s of distress noted.  
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
## 2019-08-12 NOTE — Patient Instructions (Addendum)
[facility] at [facility] Discharge Instructions  You were seen today by Dr. Katragadda. He went over your recent lab results. He will see you back in 4 weeks for labs and follow up.   Thank you for choosing [facility] at [facility] to provide your oncology and hematology care.  To afford each patient quality time with our provider, please arrive at least 15 minutes before your scheduled appointment time.   If you have a lab appointment with the Cancer Center please come in thru the  Main Entrance and check in at the main information desk  You need to re-schedule your appointment should you arrive 10 or more minutes late.  We strive to give you quality time with our providers, and arriving late affects you and other patients whose appointments are after yours.  Also, if you no show three or more times for appointments you may be dismissed from the clinic at the providers discretion.     Again, thank you for choosing [facility].  Our hope is that these requests will decrease the amount of time that you wait before being seen by our physicians.       _____________________________________________________________  Should you have questions after your visit to [facility], please contact our office at [phone] between the hours of 8:00 a.m. and 4:30 p.m.  Voicemails left after 4:00 p.m. will not be returned until the following business day.  For prescription refill requests, have your pharmacy contact our office and allow 72 hours.    Cancer Center Support Programs:   > Cancer Support Group  2nd Tuesday of the month 1pm-2pm, Journey Room    

## 2019-08-12 NOTE — Assessment & Plan Note (Addendum)
1.  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 -6 cycles of docetaxel from 11/19/2017 through 11/132019.  He developed left foot drop from it. -Zytiga and prednisone started on 05/07/2018, PSA was 4.52. -Last PSA was 0.32 on 06/17/2019. -I have reviewed his labs.  LFTs are normal.  He will continue Zytiga as long as it helps.  2.  Bone metastasis: -Calcium is 9.1.  He cannot take oral calcium secondary to constipation. -He will continue denosumab monthly.  3.  Normocytic anemia: -This is from combination of CKD and iron deficiency. -Hemoglobin today is 12 and ferritin is 269.  Saturation is 28.  4.  Back pain: -He developed back pain which lasted couple of weeks.  He took tramadol for it.  This has improved at this time.  5.  CKD: -His baseline creatinine is between 1.5-1.6. -Today's creatinine is 1.65.  We will closely monitor it.

## 2019-08-19 ENCOUNTER — Ambulatory Visit: Payer: Medicaid Other | Attending: Internal Medicine

## 2019-08-19 DIAGNOSIS — Z23 Encounter for immunization: Secondary | ICD-10-CM

## 2019-08-19 NOTE — Progress Notes (Signed)
   Covid-19 Vaccination Clinic  Name:  Carlos Harper    MRN: [id number] DOB: Feb 23, 1941  08/19/2019  [name] was observed post Covid-19 immunization for 15 minutes without incident. He was provided with Vaccine Information Sheet and instruction to access the V-Safe system.   [name] was instructed to call 911 with any severe reactions post vaccine: Marland Kitchen Difficulty breathing  . Swelling of face and throat  . A fast heartbeat  . A bad rash all over body  . Dizziness and weakness   Immunizations Administered    Name Date Dose VIS Date Route   Pfizer COVID-19 Vaccine 08/19/2019  9:23 AM 0.3 mL 06/17/2018 Intramuscular   Manufacturer: [place]   Lot: [id number]   [place]: [id number]

## 2019-08-31 ENCOUNTER — Other Ambulatory Visit (HOSPITAL_COMMUNITY): Payer: Self-pay | Admitting: Nurse Practitioner

## 2019-08-31 DIAGNOSIS — C61 Malignant neoplasm of prostate: Secondary | ICD-10-CM

## 2019-09-01 ENCOUNTER — Other Ambulatory Visit (HOSPITAL_COMMUNITY): Payer: Self-pay | Admitting: *Deleted

## 2019-09-01 DIAGNOSIS — C61 Malignant neoplasm of prostate: Secondary | ICD-10-CM

## 2019-09-01 MED ORDER — ABIRATERONE ACETATE 250 MG PO TABS: 1000.0000 mg | ORAL_TABLET | Freq: Every day | ORAL | 0 refills | Status: DC

## 2019-09-02 ENCOUNTER — Other Ambulatory Visit (HOSPITAL_COMMUNITY): Payer: Self-pay | Admitting: Surgery

## 2019-09-09 ENCOUNTER — Inpatient Hospital Stay (HOSPITAL_BASED_OUTPATIENT_CLINIC_OR_DEPARTMENT_OTHER): Payer: Medicaid Other | Admitting: Hematology

## 2019-09-09 ENCOUNTER — Other Ambulatory Visit: Payer: Self-pay

## 2019-09-09 ENCOUNTER — Inpatient Hospital Stay (HOSPITAL_COMMUNITY): Payer: Medicaid Other

## 2019-09-09 ENCOUNTER — Encounter (HOSPITAL_COMMUNITY): Payer: Self-pay | Admitting: Hematology

## 2019-09-09 ENCOUNTER — Inpatient Hospital Stay (HOSPITAL_COMMUNITY): Payer: Medicaid Other | Attending: Hematology

## 2019-09-09 ENCOUNTER — Other Ambulatory Visit (HOSPITAL_COMMUNITY): Payer: Medicaid Other

## 2019-09-09 VITALS — BP 139/80 | HR 63 | Temp 97.1°F | Resp 18 | Wt 179.7 lb

## 2019-09-09 DIAGNOSIS — C61 Malignant neoplasm of prostate: Secondary | ICD-10-CM | POA: Insufficient documentation

## 2019-09-09 DIAGNOSIS — D631 Anemia in chronic kidney disease: Secondary | ICD-10-CM | POA: Insufficient documentation

## 2019-09-09 DIAGNOSIS — C7951 Secondary malignant neoplasm of bone: Secondary | ICD-10-CM | POA: Insufficient documentation

## 2019-09-09 DIAGNOSIS — N189 Chronic kidney disease, unspecified: Secondary | ICD-10-CM | POA: Insufficient documentation

## 2019-09-09 LAB — PSA: Prostatic Specific Antigen: 0.23 ng/mL (ref 0.00–4.00)

## 2019-09-09 LAB — CBC WITH DIFFERENTIAL/PLATELET
Abs Immature Granulocytes: 0.01 10*3/uL (ref 0.00–0.07)
Basophils Absolute: 0 10*3/uL (ref 0.0–0.1)
Basophils Relative: 1 %
Eosinophils Absolute: 0 10*3/uL (ref 0.0–0.5)
Eosinophils Relative: 1 %
HCT: 34.1 % — ABNORMAL LOW (ref 39.0–52.0)
Hemoglobin: 11.4 g/dL — ABNORMAL LOW (ref 13.0–17.0)
Immature Granulocytes: 0 %
Lymphocytes Relative: 31 %
Lymphs Abs: 1.2 10*3/uL (ref 0.7–4.0)
MCH: 32 pg (ref 26.0–34.0)
MCHC: 33.4 g/dL (ref 30.0–36.0)
MCV: 95.8 fL (ref 80.0–100.0)
Monocytes Absolute: 0.3 10*3/uL (ref 0.1–1.0)
Monocytes Relative: 8 %
Neutro Abs: 2.3 10*3/uL (ref 1.7–7.7)
Neutrophils Relative %: 59 %
Platelets: 205 10*3/uL (ref 150–400)
RBC: 3.56 MIL/uL — ABNORMAL LOW (ref 4.22–5.81)
RDW: 13.8 % (ref 11.5–15.5)
WBC: 3.9 10*3/uL — ABNORMAL LOW (ref 4.0–10.5)
nRBC: 0 % (ref 0.0–0.2)

## 2019-09-09 LAB — COMPREHENSIVE METABOLIC PANEL
ALT: 15 U/L (ref 0–44)
AST: 19 U/L (ref 15–41)
Albumin: 3.8 g/dL (ref 3.5–5.0)
Alkaline Phosphatase: 46 U/L (ref 38–126)
Anion gap: 10 (ref 5–15)
BUN: 22 mg/dL (ref 8–23)
CO2: 26 mmol/L (ref 22–32)
Calcium: 9.1 mg/dL (ref 8.9–10.3)
Chloride: 104 mmol/L (ref 98–111)
Creatinine, Ser: 1.62 mg/dL — ABNORMAL HIGH (ref 0.61–1.24)
GFR calc Af Amer: 46 mL/min — ABNORMAL LOW (ref >60)
GFR calc non Af Amer: 40 mL/min — ABNORMAL LOW (ref >60)
Glucose, Bld: 109 mg/dL — ABNORMAL HIGH (ref 70–99)
Potassium: 3.8 mmol/L (ref 3.5–5.1)
Sodium: 140 mmol/L (ref 135–145)
Total Bilirubin: 1.5 mg/dL — ABNORMAL HIGH (ref 0.3–1.2)
Total Protein: 6.7 g/dL (ref 6.5–8.1)

## 2019-09-09 MED ORDER — DENOSUMAB 120 MG/1.7ML ~~LOC~~ SOLN
120.0000 mg | Freq: Once | SUBCUTANEOUS | Status: AC
  Administered 2019-09-09: 120 mg via SUBCUTANEOUS
  Filled 2019-09-09: qty 1.7

## 2019-09-09 NOTE — Patient Instructions (Signed)
[facility] at [facility] Discharge Instructions Received Xgeva injection today. Follow-up as scheduled. Call clinic for any questions or concerns   Thank you for choosing [facility] at [facility] to provide your oncology and hematology care.  To afford each patient quality time with our provider, please arrive at least 15 minutes before your scheduled appointment time.   If you have a lab appointment with the Cancer Center please come in thru the Main Entrance and check in at the main information desk.  You need to re-schedule your appointment should you arrive 10 or more minutes late.  We strive to give you quality time with our providers, and arriving late affects you and other patients whose appointments are after yours.  Also, if you no show three or more times for appointments you may be dismissed from the clinic at the providers discretion.     Again, thank you for choosing [facility].  Our hope is that these requests will decrease the amount of time that you wait before being seen by our physicians.       _____________________________________________________________  Should you have questions after your visit to  Cancer Center, please contact our office at [phone] between the hours of 8:00 a.m. and 4:30 p.m.  Voicemails left after 4:00 p.m. will not be returned until the following business day.  For prescription refill requests, have your pharmacy contact our office and allow 72 hours.    Due to Covid, you will need to wear a mask upon entering the hospital. If you do not have a mask, a mask will be given to you at the Main Entrance upon arrival. For doctor visits, patients may have 1 support person with them. For treatment visits, patients can not have anyone with them due to social distancing guidelines and our immunocompromised population.     

## 2019-09-09 NOTE — Patient Instructions (Signed)
[place] at [facility] Discharge Instructions  You were seen today by Dr. Delton Coombes. He went over your recent results. Continue with your prescribed treatment regimen. I recommend trying Tums for calcium to help prevent constipation while still getting calcium intake. Continue with monthly Xgeva shots. He will see you back in for labs and follow up in 2 months.   Thank you for choosing [place] at [facility] to provide your oncology and hematology care.  To afford each patient quality time with our provider, please arrive at least 15 minutes before your scheduled appointment time.   If you have a lab appointment with the [place] please come in thru the  Main Entrance and check in at the main information desk  You need to re-schedule your appointment should you arrive 10 or more minutes late.  We strive to give you quality time with our providers, and arriving late affects you and other patients whose appointments are after yours.  Also, if you no show three or more times for appointments you may be dismissed from the clinic at the providers discretion.     Again, thank you for choosing [facility]  Our hope is that these requests will decrease the amount of time that you wait before being seen by our physicians.       _____________________________________________________________  Should you have questions after your visit to [facility], please contact our office at [phone] between the hours of 8:00 a.m. and 4:30 p.m.  Voicemails left after 4:00 p.m. will not be returned until the following business day.  For prescription refill requests, have your pharmacy contact our office and allow 72 hours.    Cancer Center Support Programs:   > Cancer Support Group  2nd Tuesday of the month 1pm-2pm, Journey Room

## 2019-09-09 NOTE — Assessment & Plan Note (Addendum)
1.  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 -6 cycles of docetaxel from 11/19/2017 through 03/05/2018.  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 -Zytiga and prednisone started on 05/07/2018.  PSA was 4.52.  I have answered his questions about duration of treatment and prognosis.  Will consider scans if the PSA is going up. -Last PSA has come down to 0.29.  I reviewed all the labs which include normal LFTs. -Continue Zytiga and prednisone at this time.  I will see him back in 2 months for follow-up.  2.  Bone metastasis: -Calcium is normal.  Continue denosumab monthly.  He cannot take oral calcium secondary to constipation.  I have suggested him to start taking Tums.  3.  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  Hemoglobin today is 11.4.  Ferritin is 269.  Saturation is 28.  4.  Back pain: -This is completely resolved.  5.  CKD: -Baseline creatinine between 1.5 and 1.6.  Today's creatinine 1.62.

## 2019-09-09 NOTE — Progress Notes (Signed)
1436 Labs reviewed with and pt seen by Dr. Delton Coombes and pt approved for Xgeva injection today per MD                                     Carlos Harper tolerated Xgeva injection well without complaints or incident. Calcium 9.1 today and pt denied any tooth or jaw pain and no recent or future dental procedures. Pt discharged self ambulatory in satisfactory condition

## 2019-09-09 NOTE — Progress Notes (Signed)
Carlos Harper, Carlos Harper   CLINIC:  Medical Oncology/Hematology  PCP:  Raiford Simmonds., PA-C [address] [place] / [place] [address] [phone]   REASON FOR VISIT:  Follow-up for prostate cancer  CURRENT THERAPY: Zytiga and prednisone  BRIEF ONCOLOGIC HISTORY:  Oncology History  Malignant neoplasm of prostate ([place])  09/27/2017 Initial Diagnosis   Malignant neoplasm of prostate ([place])   11/19/2017 - 03/25/2018 Chemotherapy   The patient had pegfilgrastim (NEULASTA) injection 6 mg, 6 mg, Subcutaneous, Once, 6 of 6 cycles Administration: 6 mg (11/21/2017), 6 mg (12/12/2017), 6 mg (01/02/2018), 6 mg (01/24/2018), 6 mg (02/14/2018), 6 mg (03/07/2018) pegfilgrastim (NEULASTA ONPRO KIT) injection 6 mg, 6 mg, Subcutaneous, Once, 2 of 2 cycles DOCEtaxel (TAXOTERE) 140 mg in sodium chloride 0.9 % 250 mL chemo infusion, 75 mg/m2 = 140 mg, Intravenous,  Once, 6 of 6 cycles Administration: 140 mg (11/19/2017), 140 mg (12/10/2017), 140 mg (01/22/2018), 140 mg (12/31/2017), 140 mg (02/12/2018), 140 mg (03/05/2018) ondansetron (ZOFRAN) 8 mg, dexamethasone (DECADRON) 10 mg in sodium chloride 0.9 % 50 mL IVPB, , Intravenous,  Once, 4 of 4 cycles Administration:  (12/31/2017),  (01/22/2018),  (02/12/2018),  (03/05/2018)  for chemotherapy treatment.      CANCER STAGING: Cancer Staging No matching staging information was found for the patient.  INTERVAL HISTORY:  Carlos Harper 79 y.o. male returns for routine follow-up and consideration for next cycle of chemotherapy. Karver was last seen on 08/12/2019.  Overall, he tells me he has been feeling pretty well. He has been taking his his medication well and without issue. He continues to take his steroids as directed. He stopped taking his calcium pills because they were making him constipated.   Overall, he feels ready for next cycle of chemo today.     REVIEW OF SYSTEMS:  Review of Systems  Constitutional:  Positive for fatigue (moderate). Negative for appetite change, chills and fever.  HENT:   Negative for lump/mass, mouth sores, sore throat and trouble swallowing.   Eyes: Negative for eye problems.  Respiratory: Negative for chest tightness, cough, shortness of breath and wheezing.   Cardiovascular: Negative for chest pain and palpitations.  Gastrointestinal: Negative for abdominal pain, constipation, diarrhea, nausea and vomiting.  Genitourinary: Negative for bladder incontinence, dysuria, frequency and hematuria.   Musculoskeletal: Negative for arthralgias, back pain, flank pain and myalgias.  Skin: Negative for rash.  Neurological: Positive for dizziness. Negative for headaches, light-headedness and numbness.  Hematological: Does not bruise/bleed easily.  Psychiatric/Behavioral: Negative for depression. The patient is not nervous/anxious.     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 ([place]) 09/27/2017   Sickle cell trait ([place])    Vertigo    Past Surgical History:  Procedure Laterality Date   CYSTOSCOPY WITH FULGERATION  09/17/2017   Procedure: CYSTOSCOPY WITH FULGERATION OF BLADDER NECK;  Surgeon: [name], MD;  Location: [facility];  Service: Urology;;   CYSTOSCOPY WITH STENT PLACEMENT Right 09/17/2017   Procedure: CYSTOSCOPY WITH RIGHT RETROGRADE PYELOGRAM ATTEMPTED STENT PLACEMENT;  Surgeon: [name], MD;  Location: [facility];  Service: Urology;  Laterality: Right;   left leg surgery due to [place] Bilateral 09/17/2017   Procedure: ORCHIECTOMY;  Surgeon: [name], MD;  Location: [facility];  Service: Urology;  Laterality: Bilateral;   PORTACATH PLACEMENT Left  10/14/2017   Procedure: INSERTION PORT-A-CATH;  Surgeon: Aviva Signs, MD;  Location: [facility];  Service: General;  Laterality: Left;   PROSTATE BIOPSY N/A 09/17/2017   Procedure: BIOPSY TRANSRECTAL  ULTRASONIC PROSTATE (TUBP);  Surgeon: [name], MD;  Location: [facility];  Service: Urology;  Laterality: N/A;    SOCIAL HISTORY:  Social History   Socioeconomic History   Marital status: Single    Spouse name: Not on file   Number of children: Not on file   Years of education: Not on file   Highest education level: Not on file  Occupational History   Not on file  Tobacco Use   Smoking status: Never Smoker   Smokeless tobacco: Never Used  Substance and Sexual Activity   Alcohol use: Yes    Comment: Drinks beer occasionally    Drug use: No   Sexual activity: Not on file  Other Topics Concern   Not on file  Social History Narrative   Not on file   Social Determinants of Health   Financial Resource Strain:    Difficulty of Paying Living Expenses:   Food Insecurity:    Worried About Charity fundraiser in the Last Year:    Arboriculturist in the Last Year:   Transportation Needs:    Film/video editor (Medical):    Lack of Transportation (Non-Medical):   Physical Activity:    Days of Exercise per Week:    Minutes of Exercise per Session:   Stress:    Feeling of Stress :   Social Connections:    Frequency of Communication with Friends and Family:    Frequency of Social Gatherings with Friends and Family:    Attends Religious Services:    Active Member of Clubs or Organizations:    Attends Music therapist:    Marital Status:   Intimate Partner Violence:    Fear of Current or Ex-Partner:    Emotionally Abused:    Physically Abused:    Sexually Abused:     FAMILY HISTORY:  Family History  Problem Relation Age of Onset   Hypertension Mother    Lung cancer Father    Lung cancer Brother    HIV/AIDS Brother    Breast cancer Daughter     CURRENT MEDICATIONS:  Current Outpatient Medications  Medication Sig Dispense Refill   abiraterone acetate (ZYTIGA) 250 MG tablet TAKE 4 TABLETS (1,000 MG TOTAL) BY MOUTH  DAILY. TAKE ON AN EMPTY STOMACH 1 HOUR BEFORE OR 2 HOURS AFTER A MEAL 120 tablet 0   docusate sodium (COLACE) 100 MG capsule Take 100 mg by mouth daily.     predniSONE (DELTASONE) 5 MG tablet Take 1 tablet (5 mg total) by mouth daily with breakfast. 30 tablet 6   No current facility-administered medications for this visit.    ALLERGIES:  No Known Allergies  PHYSICAL EXAM:  Performance status (ECOG): 1 - Symptomatic but completely ambulatory  Vitals:   09/09/19 1412  BP: 139/80  Pulse: 63  Resp: 18  Temp: (!) 97.1 F (36.2 C)  SpO2: 100%   Wt Readings from Last 3 Encounters:  09/09/19 179 lb 11.2 oz (81.5 kg)  08/12/19 180 lb 9.6 oz (81.9 kg)  06/17/19 178 lb 6.4 oz (80.9 kg)   Physical Exam Constitutional:      Appearance: Normal appearance.  HENT:     Nose: No congestion.     Mouth/Throat:     Mouth: Mucous membranes are moist.  Eyes:     Extraocular Movements: Extraocular movements intact.     Pupils: Pupils are equal, round, and reactive to light.  Cardiovascular:     Rate and Rhythm: Normal rate and regular rhythm.     Heart sounds: No murmur. No gallop.   Pulmonary:     Breath sounds: No wheezing, rhonchi or rales.  Abdominal:     Tenderness: There is no abdominal tenderness.  Musculoskeletal:        General: No tenderness.     Cervical back: Normal range of motion. No tenderness.     Right lower leg: No edema.     Left lower leg: No edema.  Skin:    General: Skin is warm and dry.     Findings: No bruising, erythema or rash.  Neurological:     Mental Status: He is alert and oriented to person, place, and time.     Sensory: No sensory deficit.     Motor: No weakness.  Psychiatric:        Mood and Affect: Mood normal.        Behavior: Behavior normal.        Thought Content: Thought content normal.        Judgment: Judgment normal.     LABORATORY DATA:  I have reviewed the labs as listed.  CBC Latest Ref Rng & Units 09/09/2019 08/12/2019 06/17/2019    WBC 4.0 - 10.5 K/uL 3.9(L) 5.5 4.6  Hemoglobin 13.0 - 17.0 g/dL 11.4(L) 12.0(L) 12.1(L)  Hematocrit 39.0 - 52.0 % 34.1(L) 36.5(L) 35.9(L)  Platelets 150 - 400 K/uL 205 198 186   CMP Latest Ref Rng & Units 09/09/2019 08/12/2019 07/15/2019  Glucose 70 - 99 mg/dL 109(H) 90 86  BUN 8 - 23 mg/dL '22 22 22  ' Creatinine 0.61 - 1.24 mg/dL 1.62(H) 1.65(H) 1.59(H)  Sodium 135 - 145 mmol/L 140 141 140  Potassium 3.5 - 5.1 mmol/L 3.8 3.8 3.3(L)  Chloride 98 - 111 mmol/L 104 104 105  CO2 22 - 32 mmol/L '26 28 26  ' Calcium 8.9 - 10.3 mg/dL 9.1 9.1 9.2  Total Protein 6.5 - 8.1 g/dL 6.7 7.0 7.2  Total Bilirubin 0.3 - 1.2 mg/dL 1.5(H) 1.1 1.3(H)  Alkaline Phos 38 - 126 U/L 46 47 48  AST 15 - 41 U/L '19 17 21  ' ALT 0 - 44 U/L '15 15 14    ' DIAGNOSTIC IMAGING:  I have reviewed scans.  ASSESSMENT & PLAN:  Malignant neoplasm of prostate ([place]) 1.  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 -6 cycles of docetaxel from 11/19/2017 through 03/05/2018.  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 -Zytiga and prednisone started on 05/07/2018.  PSA was 4.52.  I have answered his questions about duration of treatment and prognosis.  Will consider scans if the PSA is going up. -Last PSA has come down to 0.29.  I reviewed all the labs which include normal LFTs. -Continue Zytiga and prednisone at this time.  I will see him back in 2 months for follow-up.  2.  Bone metastasis: -Calcium is normal.  Continue denosumab monthly.  He cannot take oral calcium secondary to constipation.  I have suggested him to start taking Tums.  3.  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  Hemoglobin today is 11.4.  Ferritin is 269.  Saturation is 28.  4.  Back pain: -This is completely resolved.  5.  CKD: -Baseline creatinine between 1.5 and 1.6.  Today's creatinine 1.62.    Orders placed this encounter:  Orders Placed This Encounter  Procedures    Comprehensive metabolic panel   CBC with Differential   PSA   Total time spent is 30 minutes with more than 50% of time spent face-to-face discussing treatment plan, prognosis, counseling and coordination of care.  Derek Jack, MD, 09/09/19 6:06 PM  [place] [phone]   I, Jacqualyn Posey, am acting as a scribe for Dr. Sanda Linger.  I, Derek Jack MD, have reviewed the above documentation for accuracy and completeness, and I agree with the above.

## 2019-09-10 MED ORDER — LANREOTIDE ACETATE 120 MG/0.5ML ~~LOC~~ SOLN
SUBCUTANEOUS | Status: AC
  Filled 2019-09-10: qty 120

## 2019-09-10 MED ORDER — FULVESTRANT 250 MG/5ML IM SOLN
INTRAMUSCULAR | Status: AC
  Filled 2019-09-10: qty 10

## 2019-10-07 ENCOUNTER — Ambulatory Visit (HOSPITAL_COMMUNITY): Payer: Medicaid Other

## 2019-10-07 ENCOUNTER — Other Ambulatory Visit (HOSPITAL_COMMUNITY): Payer: Medicaid Other

## 2019-10-08 ENCOUNTER — Other Ambulatory Visit: Payer: Self-pay

## 2019-10-08 ENCOUNTER — Inpatient Hospital Stay (HOSPITAL_COMMUNITY): Payer: Medicaid Other | Attending: Hematology

## 2019-10-08 ENCOUNTER — Encounter (HOSPITAL_COMMUNITY): Payer: Self-pay

## 2019-10-08 ENCOUNTER — Inpatient Hospital Stay (HOSPITAL_COMMUNITY): Payer: Medicaid Other

## 2019-10-08 VITALS — BP 153/92 | HR 75 | Temp 97.2°F | Resp 18

## 2019-10-08 DIAGNOSIS — C61 Malignant neoplasm of prostate: Secondary | ICD-10-CM | POA: Diagnosis not present

## 2019-10-08 DIAGNOSIS — C7951 Secondary malignant neoplasm of bone: Secondary | ICD-10-CM | POA: Diagnosis present

## 2019-10-08 LAB — COMPREHENSIVE METABOLIC PANEL
ALT: 15 U/L (ref 0–44)
AST: 18 U/L (ref 15–41)
Albumin: 4.1 g/dL (ref 3.5–5.0)
Alkaline Phosphatase: 49 U/L (ref 38–126)
Anion gap: 8 (ref 5–15)
BUN: 22 mg/dL (ref 8–23)
CO2: 28 mmol/L (ref 22–32)
Calcium: 9.5 mg/dL (ref 8.9–10.3)
Chloride: 104 mmol/L (ref 98–111)
Creatinine, Ser: 1.56 mg/dL — ABNORMAL HIGH (ref 0.61–1.24)
GFR calc Af Amer: 48 mL/min — ABNORMAL LOW (ref >60)
GFR calc non Af Amer: 42 mL/min — ABNORMAL LOW (ref >60)
Glucose, Bld: 77 mg/dL (ref 70–99)
Potassium: 4.1 mmol/L (ref 3.5–5.1)
Sodium: 140 mmol/L (ref 135–145)
Total Bilirubin: 1.1 mg/dL (ref 0.3–1.2)
Total Protein: 7.1 g/dL (ref 6.5–8.1)

## 2019-10-08 MED ORDER — DENOSUMAB 120 MG/1.7ML ~~LOC~~ SOLN
120.0000 mg | Freq: Once | SUBCUTANEOUS | Status: AC
  Administered 2019-10-08: 120 mg via SUBCUTANEOUS
  Filled 2019-10-08: qty 1.7

## 2019-10-08 NOTE — Progress Notes (Signed)
Carlos Harper presents today for injection per the provider's orders.  Xgeva administration without incident; injection site WNL; see MAR for injection details.  Patient tolerated procedure well and without incident.  No questions or complaints noted at this time. Pt d/c clinic ambulatory

## 2019-10-16 MED FILL — predniSONE 5 MG TABS: 5 | 30 days supply | Qty: 30 | Fill #6

## 2019-10-30 ENCOUNTER — Other Ambulatory Visit (HOSPITAL_COMMUNITY): Payer: Self-pay | Admitting: Nurse Practitioner

## 2019-10-30 DIAGNOSIS — C61 Malignant neoplasm of prostate: Secondary | ICD-10-CM

## 2019-11-02 MED FILL — ABIRATERONE ACETATE 250 MG: 250 | 30 days supply | Qty: 120 | Fill #0

## 2019-11-04 ENCOUNTER — Other Ambulatory Visit (HOSPITAL_COMMUNITY): Payer: Medicaid Other

## 2019-11-04 ENCOUNTER — Ambulatory Visit (HOSPITAL_COMMUNITY): Payer: Medicaid Other

## 2019-11-04 ENCOUNTER — Ambulatory Visit (HOSPITAL_COMMUNITY): Payer: Medicaid Other | Admitting: Hematology

## 2019-11-05 ENCOUNTER — Inpatient Hospital Stay (HOSPITAL_COMMUNITY): Payer: Medicaid Other

## 2019-11-05 ENCOUNTER — Inpatient Hospital Stay (HOSPITAL_COMMUNITY): Payer: Medicaid Other | Attending: Hematology

## 2019-11-05 ENCOUNTER — Other Ambulatory Visit: Payer: Self-pay

## 2019-11-05 ENCOUNTER — Encounter (HOSPITAL_COMMUNITY): Payer: Self-pay | Admitting: Hematology

## 2019-11-05 ENCOUNTER — Inpatient Hospital Stay (HOSPITAL_BASED_OUTPATIENT_CLINIC_OR_DEPARTMENT_OTHER): Payer: Medicaid Other | Admitting: Hematology

## 2019-11-05 VITALS — BP 142/77 | HR 68 | Temp 97.0°F | Resp 18

## 2019-11-05 VITALS — Wt 180.1 lb

## 2019-11-05 DIAGNOSIS — R42 Dizziness and giddiness: Secondary | ICD-10-CM | POA: Insufficient documentation

## 2019-11-05 DIAGNOSIS — C7951 Secondary malignant neoplasm of bone: Secondary | ICD-10-CM | POA: Insufficient documentation

## 2019-11-05 DIAGNOSIS — D631 Anemia in chronic kidney disease: Secondary | ICD-10-CM | POA: Insufficient documentation

## 2019-11-05 DIAGNOSIS — C61 Malignant neoplasm of prostate: Secondary | ICD-10-CM

## 2019-11-05 DIAGNOSIS — N189 Chronic kidney disease, unspecified: Secondary | ICD-10-CM | POA: Insufficient documentation

## 2019-11-05 LAB — CBC WITH DIFFERENTIAL/PLATELET
Abs Immature Granulocytes: 0.01 10*3/uL (ref 0.00–0.07)
Basophils Absolute: 0 10*3/uL (ref 0.0–0.1)
Basophils Relative: 1 %
Eosinophils Absolute: 0 10*3/uL (ref 0.0–0.5)
Eosinophils Relative: 1 %
HCT: 34.4 % — ABNORMAL LOW (ref 39.0–52.0)
Hemoglobin: 11.4 g/dL — ABNORMAL LOW (ref 13.0–17.0)
Immature Granulocytes: 0 %
Lymphocytes Relative: 47 %
Lymphs Abs: 2 10*3/uL (ref 0.7–4.0)
MCH: 31.6 pg (ref 26.0–34.0)
MCHC: 33.1 g/dL (ref 30.0–36.0)
MCV: 95.3 fL (ref 80.0–100.0)
Monocytes Absolute: 0.4 10*3/uL (ref 0.1–1.0)
Monocytes Relative: 9 %
Neutro Abs: 1.8 10*3/uL (ref 1.7–7.7)
Neutrophils Relative %: 42 %
Platelets: 221 10*3/uL (ref 150–400)
RBC: 3.61 MIL/uL — ABNORMAL LOW (ref 4.22–5.81)
RDW: 13.9 % (ref 11.5–15.5)
WBC: 4.2 10*3/uL (ref 4.0–10.5)
nRBC: 0 % (ref 0.0–0.2)

## 2019-11-05 LAB — COMPREHENSIVE METABOLIC PANEL
ALT: 15 U/L (ref 0–44)
AST: 18 U/L (ref 15–41)
Albumin: 4.2 g/dL (ref 3.5–5.0)
Alkaline Phosphatase: 47 U/L (ref 38–126)
Anion gap: 8 (ref 5–15)
BUN: 21 mg/dL (ref 8–23)
CO2: 24 mmol/L (ref 22–32)
Calcium: 9 mg/dL (ref 8.9–10.3)
Chloride: 107 mmol/L (ref 98–111)
Creatinine, Ser: 1.56 mg/dL — ABNORMAL HIGH (ref 0.61–1.24)
GFR calc Af Amer: 48 mL/min — ABNORMAL LOW (ref >60)
GFR calc non Af Amer: 42 mL/min — ABNORMAL LOW (ref >60)
Glucose, Bld: 91 mg/dL (ref 70–99)
Potassium: 3.3 mmol/L — ABNORMAL LOW (ref 3.5–5.1)
Sodium: 139 mmol/L (ref 135–145)
Total Bilirubin: 1.2 mg/dL (ref 0.3–1.2)
Total Protein: 6.9 g/dL (ref 6.5–8.1)

## 2019-11-05 LAB — PSA: Prostatic Specific Antigen: 0.23 ng/mL (ref 0.00–4.00)

## 2019-11-05 MED ORDER — DENOSUMAB 120 MG/1.7ML ~~LOC~~ SOLN
120.0000 mg | Freq: Once | SUBCUTANEOUS | Status: AC
  Administered 2019-11-05: 120 mg via SUBCUTANEOUS

## 2019-11-05 NOTE — Progress Notes (Signed)
Patient is taking zytiga and has not missed any doses and reports no side effects at this time.

## 2019-11-05 NOTE — Patient Instructions (Signed)
[place] at [facility] Discharge Instructions  You were seen today by Dr. Delton Coombes. He went over your recent results. You got your injection today and you will receive the next injection in 1 month. Eat 1 banana every other morning. Dr. Delton Coombes will see you back in 2 months for labs and follow up.   Thank you for choosing [place] at [facility] to provide your oncology and hematology care.  To afford each patient quality time with our provider, please arrive at least 15 minutes before your scheduled appointment time.   If you have a lab appointment with the [place] please come in thru the Main Entrance and check in at the main information desk  You need to re-schedule your appointment should you arrive 10 or more minutes late.  We strive to give you quality time with our providers, and arriving late affects you and other patients whose appointments are after yours.  Also, if you no show three or more times for appointments you may be dismissed from the clinic at the providers discretion.     Again, thank you for choosing [facility].  Our hope is that these requests will decrease the amount of time that you wait before being seen by our physicians.       _____________________________________________________________  Should you have questions after your visit to [facility], please contact our office at [phone] between the hours of 8:00 a.m. and 4:30 p.m.  Voicemails left after 4:00 p.m. will not be returned until the following business day.  For prescription refill requests, have your pharmacy contact our office and allow 72 hours.    Cancer Center Support Programs:   > Cancer Support Group  2nd Tuesday of the month 1pm-2pm, Journey Room

## 2019-11-05 NOTE — Progress Notes (Signed)
Patient drinking orange juice with calcium, unable to take calcium pills due to constipation (Dr. Delton Coombes aware). Denied tooth, jaw, and leg pain.  No recent or upcoming dental visits.  Patient tolerated xgeva injection with no complaints.  Site clean and dry with no bruising or swelling noted at site.  Band aid applied.  VSS with discharge and left ambulatory with no s/s of distress noted.
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
## 2019-11-18 ENCOUNTER — Telehealth (HOSPITAL_COMMUNITY): Payer: Self-pay | Admitting: *Deleted

## 2019-11-18 NOTE — Telephone Encounter (Signed)
Carlos Harper daughter called stating that her father states that he doesn't feel well. He states that he is dizzy and light headed and doesn't feel worth 2 cents. He doesn't have a PCP and is currently only taking medications that we have prescribed. An appointment was given for tomorrow to see Francene Finders NP. Pt's daughter aware of appointment.

## 2019-11-19 ENCOUNTER — Inpatient Hospital Stay (HOSPITAL_BASED_OUTPATIENT_CLINIC_OR_DEPARTMENT_OTHER): Payer: Medicaid Other | Admitting: Nurse Practitioner

## 2019-11-19 ENCOUNTER — Other Ambulatory Visit: Payer: Self-pay

## 2019-11-19 DIAGNOSIS — C61 Malignant neoplasm of prostate: Secondary | ICD-10-CM

## 2019-11-19 MED ORDER — PREDNISONE 5 MG PO TABS: 5.0000 mg | ORAL_TABLET | Freq: Every day | ORAL | 6 refills | Status: DC

## 2019-11-19 NOTE — Assessment & Plan Note (Addendum)
1.  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 - He did report back pain and chest wall pain for 3 months prior which improved after orchiectomy on 09/17/2017. - He had a bone scan on 09/12/2017 which showed extensive abnormal uptake in the shoulders, sternum, ribs, throughout the spine, pelvis and proximal to mid femurs. - He had a CT of the abdomen and pelvis without contrast on 09/12/2017 which showed extensive retroperitoneal adenopathy, right hydronephrosis. - He underwent a prostate biopsy on 09/17/2017.  (Gleason 9) bilateral orchiectomy, cystoscopy with fulguration of bladder neck, and attempted right ureteral stent placement. - PSA on 09/24/2017 was 422 -he had 6 cycles of docetaxel chemotherapy from 11/19/2017 through 03/05/2018. -She had a repeat CT of abdomen and pelvis dated 03/25/2018 which showed interval reduction in size of pelvic lymph nodes which are now less than 10 mm in short axis.  No evidence of new adenopathy in the abdomen or pelvis.  Interval marked improvement in the right hydronephrosis.  Measurable volume reduction in the prostate gland. - PSA on 04/10/2018 was 4.52. -Bone scan dated 04/29/2018 showed widespread metastatic disease, mildly decrease in uptake with no new lesions. -Zytiga and prednisone 5 mg daily started on 05/07/2018.  He is tolerating this well.  Blood pressure is better and potassium is normal. - We will continue 1000mg  of Zytiga daily and 5 mg of prednisone daily. - PSA on 11/05/2019 was 0.23 -Labs on 11/05/2019 showed his potassium 3.3, creatinine 1.56, AST 18 ALT 15, WBC 4.2, hemoglobin 11.4, platelets 221 -We will see him back in 2 month with labs.  2.  Bone metastasis: -He will continue monthly denosumab -Labs on 11/05/2019 showed his calcium of 9.0 -She continues to take her calcium supplements daily.  3.  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 -He has received intermittent Feraheme in the past. -Labs done on 11/05/2019 showed his hemoglobin 11.4, platelets 221 - We do not recommend any replacement iron at this time. -We will recheck an iron panel in 2 month.  4.  Dizziness: -Patient was just seen on 11/05/2019 he made a follow-up appointment today due to his dizziness. -He reports he has had dizziness even prior to his cancer diagnosis.  He reports it is vertigo.  However his daughter wanted to him to get checked. -He does report decrease water intake.  We will increase his water intake. -He will also start checking his blood pressure when he has his dizzy spells.
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
## 2019-11-23 ENCOUNTER — Other Ambulatory Visit (HOSPITAL_COMMUNITY): Payer: Self-pay | Admitting: Hematology

## 2019-11-23 ENCOUNTER — Other Ambulatory Visit (HOSPITAL_COMMUNITY): Payer: Self-pay | Admitting: Nurse Practitioner

## 2019-11-23 DIAGNOSIS — C61 Malignant neoplasm of prostate: Secondary | ICD-10-CM

## 2019-11-25 MED FILL — ABIRATERONE ACETATE 250 MG: 250 | 30 days supply | Qty: 120 | Fill #0

## 2019-12-07 ENCOUNTER — Other Ambulatory Visit: Payer: Self-pay

## 2019-12-07 ENCOUNTER — Inpatient Hospital Stay (HOSPITAL_COMMUNITY): Payer: Medicaid Other

## 2019-12-07 ENCOUNTER — Ambulatory Visit (HOSPITAL_COMMUNITY): Payer: Medicaid Other

## 2019-12-07 ENCOUNTER — Inpatient Hospital Stay (HOSPITAL_COMMUNITY): Payer: Medicaid Other | Attending: Hematology

## 2019-12-07 ENCOUNTER — Encounter (HOSPITAL_COMMUNITY): Payer: Self-pay

## 2019-12-07 ENCOUNTER — Other Ambulatory Visit (HOSPITAL_COMMUNITY): Payer: Medicaid Other

## 2019-12-07 VITALS — BP 134/80 | HR 71 | Temp 97.3°F | Resp 18

## 2019-12-07 DIAGNOSIS — C61 Malignant neoplasm of prostate: Secondary | ICD-10-CM | POA: Insufficient documentation

## 2019-12-07 DIAGNOSIS — C7951 Secondary malignant neoplasm of bone: Secondary | ICD-10-CM | POA: Diagnosis present

## 2019-12-07 LAB — COMPREHENSIVE METABOLIC PANEL
ALT: 15 U/L (ref 0–44)
AST: 17 U/L (ref 15–41)
Albumin: 4.1 g/dL (ref 3.5–5.0)
Alkaline Phosphatase: 44 U/L (ref 38–126)
Anion gap: 8 (ref 5–15)
BUN: 20 mg/dL (ref 8–23)
CO2: 25 mmol/L (ref 22–32)
Calcium: 9 mg/dL (ref 8.9–10.3)
Chloride: 107 mmol/L (ref 98–111)
Creatinine, Ser: 1.56 mg/dL — ABNORMAL HIGH (ref 0.61–1.24)
GFR calc Af Amer: 48 mL/min — ABNORMAL LOW (ref >60)
GFR calc non Af Amer: 42 mL/min — ABNORMAL LOW (ref >60)
Glucose, Bld: 90 mg/dL (ref 70–99)
Potassium: 3.5 mmol/L (ref 3.5–5.1)
Sodium: 140 mmol/L (ref 135–145)
Total Bilirubin: 1 mg/dL (ref 0.3–1.2)
Total Protein: 7 g/dL (ref 6.5–8.1)

## 2019-12-07 MED ORDER — DENOSUMAB 120 MG/1.7ML ~~LOC~~ SOLN
120.0000 mg | Freq: Once | SUBCUTANEOUS | Status: AC
  Administered 2019-12-07: 120 mg via SUBCUTANEOUS

## 2019-12-07 MED ORDER — DENOSUMAB 120 MG/1.7ML ~~LOC~~ SOLN
SUBCUTANEOUS | Status: AC
  Filled 2019-12-07: qty 1.7

## 2019-12-07 NOTE — Patient Instructions (Signed)
[facility] at [facility] Discharge Instructions  Received Xgeva injection today. Follow-up as scheduled   Thank you for choosing [facility] at [facility] to provide your oncology and hematology care.  To afford each patient quality time with our provider, please arrive at least 15 minutes before your scheduled appointment time.   If you have a lab appointment with the Cancer Center please come in thru the Main Entrance and check in at the main information desk.  You need to re-schedule your appointment should you arrive 10 or more minutes late.  We strive to give you quality time with our providers, and arriving late affects you and other patients whose appointments are after yours.  Also, if you no show three or more times for appointments you may be dismissed from the clinic at the providers discretion.     Again, thank you for choosing [facility].  Our hope is that these requests will decrease the amount of time that you wait before being seen by our physicians.       _____________________________________________________________  Should you have questions after your visit to [facility], please contact our office at [phone] and follow the prompts.  Our office hours are 8:00 a.m. and 4:30 p.m. Monday - Friday.  Please note that voicemails left after 4:00 p.m. may not be returned until the following business day.  We are closed weekends and major holidays.  You do have access to a nurse 24-7, just call the main number to the clinic [phone] and do not press any options, hold on the line and a nurse will answer the phone.    For prescription refill requests, have your pharmacy contact our office and allow 72 hours.    Due to Covid, you will need to wear a mask upon entering the hospital. If you do not have a mask, a mask will be given to you at the Main Entrance upon arrival. For doctor visits, patients may have 1  support person age 18 or older with them. For treatment visits, patients can not have anyone with them due to social distancing guidelines and our immunocompromised population.     

## 2019-12-07 NOTE — Progress Notes (Signed)
Carlos Harper tolerated Xgeva injection well without complaints or incident. Calcium 9 today and pt denied any tooth or jaw pain and no recent or future dental visits prior to administering this medication. VSS Pt discharged self ambulatory in satisfactory condition

## 2019-12-18 ENCOUNTER — Other Ambulatory Visit (HOSPITAL_COMMUNITY): Payer: Self-pay | Admitting: Nurse Practitioner

## 2019-12-18 DIAGNOSIS — C61 Malignant neoplasm of prostate: Secondary | ICD-10-CM

## 2019-12-21 MED FILL — ABIRATERONE ACETATE 250 MG: 250 | 30 days supply | Qty: 120 | Fill #0

## 2020-01-07 ENCOUNTER — Inpatient Hospital Stay (HOSPITAL_COMMUNITY): Payer: Medicaid Other

## 2020-01-07 ENCOUNTER — Other Ambulatory Visit (HOSPITAL_COMMUNITY): Payer: Medicaid Other

## 2020-01-07 ENCOUNTER — Ambulatory Visit (HOSPITAL_COMMUNITY): Payer: Medicaid Other | Admitting: Hematology

## 2020-01-07 ENCOUNTER — Inpatient Hospital Stay (HOSPITAL_BASED_OUTPATIENT_CLINIC_OR_DEPARTMENT_OTHER): Payer: Medicaid Other | Admitting: Hematology

## 2020-01-07 ENCOUNTER — Inpatient Hospital Stay (HOSPITAL_COMMUNITY): Payer: Medicaid Other | Attending: Hematology

## 2020-01-07 ENCOUNTER — Ambulatory Visit (HOSPITAL_COMMUNITY): Payer: Medicaid Other

## 2020-01-07 ENCOUNTER — Other Ambulatory Visit: Payer: Self-pay

## 2020-01-07 VITALS — BP 149/87 | HR 65 | Temp 97.1°F | Resp 18 | Wt 178.2 lb

## 2020-01-07 DIAGNOSIS — N189 Chronic kidney disease, unspecified: Secondary | ICD-10-CM | POA: Diagnosis not present

## 2020-01-07 DIAGNOSIS — C61 Malignant neoplasm of prostate: Secondary | ICD-10-CM

## 2020-01-07 DIAGNOSIS — D631 Anemia in chronic kidney disease: Secondary | ICD-10-CM | POA: Diagnosis not present

## 2020-01-07 DIAGNOSIS — C7951 Secondary malignant neoplasm of bone: Secondary | ICD-10-CM | POA: Insufficient documentation

## 2020-01-07 DIAGNOSIS — I129 Hypertensive chronic kidney disease with stage 1 through stage 4 chronic kidney disease, or unspecified chronic kidney disease: Secondary | ICD-10-CM | POA: Diagnosis not present

## 2020-01-07 DIAGNOSIS — D573 Sickle-cell trait: Secondary | ICD-10-CM | POA: Diagnosis not present

## 2020-01-07 LAB — COMPREHENSIVE METABOLIC PANEL
ALT: 13 U/L (ref 0–44)
AST: 18 U/L (ref 15–41)
Albumin: 3.9 g/dL (ref 3.5–5.0)
Alkaline Phosphatase: 40 U/L (ref 38–126)
Anion gap: 10 (ref 5–15)
BUN: 22 mg/dL (ref 8–23)
CO2: 22 mmol/L (ref 22–32)
Calcium: 9.2 mg/dL (ref 8.9–10.3)
Chloride: 105 mmol/L (ref 98–111)
Creatinine, Ser: 1.57 mg/dL — ABNORMAL HIGH (ref 0.61–1.24)
GFR calc Af Amer: 48 mL/min — ABNORMAL LOW (ref >60)
GFR calc non Af Amer: 41 mL/min — ABNORMAL LOW (ref >60)
Glucose, Bld: 107 mg/dL — ABNORMAL HIGH (ref 70–99)
Potassium: 3.4 mmol/L — ABNORMAL LOW (ref 3.5–5.1)
Sodium: 137 mmol/L (ref 135–145)
Total Bilirubin: 1.3 mg/dL — ABNORMAL HIGH (ref 0.3–1.2)
Total Protein: 6.8 g/dL (ref 6.5–8.1)

## 2020-01-07 LAB — CBC WITH DIFFERENTIAL/PLATELET
Abs Immature Granulocytes: 0.01 10*3/uL (ref 0.00–0.07)
Basophils Absolute: 0 10*3/uL (ref 0.0–0.1)
Basophils Relative: 1 %
Eosinophils Absolute: 0 10*3/uL (ref 0.0–0.5)
Eosinophils Relative: 1 %
HCT: 34.5 % — ABNORMAL LOW (ref 39.0–52.0)
Hemoglobin: 11.5 g/dL — ABNORMAL LOW (ref 13.0–17.0)
Immature Granulocytes: 0 %
Lymphocytes Relative: 39 %
Lymphs Abs: 1.7 10*3/uL (ref 0.7–4.0)
MCH: 32 pg (ref 26.0–34.0)
MCHC: 33.3 g/dL (ref 30.0–36.0)
MCV: 96.1 fL (ref 80.0–100.0)
Monocytes Absolute: 0.3 10*3/uL (ref 0.1–1.0)
Monocytes Relative: 7 %
Neutro Abs: 2.2 10*3/uL (ref 1.7–7.7)
Neutrophils Relative %: 52 %
Platelets: 193 10*3/uL (ref 150–400)
RBC: 3.59 MIL/uL — ABNORMAL LOW (ref 4.22–5.81)
RDW: 14 % (ref 11.5–15.5)
WBC: 4.2 10*3/uL (ref 4.0–10.5)
nRBC: 0 % (ref 0.0–0.2)

## 2020-01-07 LAB — LACTATE DEHYDROGENASE: LDH: 144 U/L (ref 98–192)

## 2020-01-07 LAB — VITAMIN B12: Vitamin B-12: 460 pg/mL (ref 180–914)

## 2020-01-07 LAB — PSA: Prostatic Specific Antigen: 0.17 ng/mL (ref 0.00–4.00)

## 2020-01-07 LAB — VITAMIN D 25 HYDROXY (VIT D DEFICIENCY, FRACTURES): Vit D, 25-Hydroxy: 31.77 ng/mL (ref 30–100)

## 2020-01-07 MED ORDER — DENOSUMAB 120 MG/1.7ML ~~LOC~~ SOLN
120.0000 mg | Freq: Once | SUBCUTANEOUS | Status: AC
  Administered 2020-01-07: 120 mg via SUBCUTANEOUS
  Filled 2020-01-07: qty 1.7

## 2020-01-07 NOTE — Patient Instructions (Signed)
[place] at [facility] Discharge Instructions  You were seen today by Dr. Delton Coombes. He went over your recent results. You received your injection today; continue receiving your injections monthly. Dr. Delton Coombes will see you back in 2 months for labs and follow up.   Thank you for choosing [place] at [facility] to provide your oncology and hematology care.  To afford each patient quality time with our provider, please arrive at least 15 minutes before your scheduled appointment time.   If you have a lab appointment with the [facility] please come in thru the Main Entrance and check in at the main information desk  You need to re-schedule your appointment should you arrive 10 or more minutes late.  We strive to give you quality time with our providers, and arriving late affects you and other patients whose appointments are after yours.  Also, if you no show three or more times for appointments you may be dismissed from the clinic at the providers discretion.     Again, thank you for choosing [facility].  Our hope is that these requests will decrease the amount of time that you wait before being seen by our physicians.       _____________________________________________________________  Should you have questions after your visit to [facility], please contact our office at [phone] between the hours of 8:00 a.m. and 4:30 p.m.  Voicemails left after 4:00 p.m. will not be returned until the following business day.  For prescription refill requests, have your pharmacy contact our office and allow 72 hours.    Cancer Center Support Programs:   > Cancer Support Group  2nd Tuesday of the month 1pm-2pm, Journey Room
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
## 2020-01-07 NOTE — Progress Notes (Signed)
Patient taking calcium as directed.  Denied tooth, jaw, and leg pain.  No recent or upcoming dental visits.  Labs reviewed.  Patient tolerated injection with no complaints voiced.  See MAR for details.  Site clean and dry with no bruising or swelling noted.  Band aid applied.  Vss with discharge and left ambulatory with no s/s of distress noted.  

## 2020-01-13 ENCOUNTER — Other Ambulatory Visit (HOSPITAL_COMMUNITY): Payer: Self-pay | Admitting: Nurse Practitioner

## 2020-01-13 DIAGNOSIS — C61 Malignant neoplasm of prostate: Secondary | ICD-10-CM

## 2020-01-14 MED FILL — predniSONE 5 MG TABS: 5 | 30 days supply | Qty: 30 | Fill #0

## 2020-01-14 MED FILL — ABIRATERONE ACETATE 250 MG: 250 | 30 days supply | Qty: 120 | Fill #0

## 2020-02-04 ENCOUNTER — Encounter (HOSPITAL_COMMUNITY): Payer: Self-pay

## 2020-02-04 ENCOUNTER — Inpatient Hospital Stay (HOSPITAL_COMMUNITY): Payer: Medicaid Other | Attending: Hematology

## 2020-02-04 ENCOUNTER — Other Ambulatory Visit: Payer: Self-pay

## 2020-02-04 ENCOUNTER — Inpatient Hospital Stay (HOSPITAL_COMMUNITY): Payer: Medicaid Other

## 2020-02-04 VITALS — BP 155/83 | HR 68 | Temp 96.9°F | Resp 18

## 2020-02-04 DIAGNOSIS — C7951 Secondary malignant neoplasm of bone: Secondary | ICD-10-CM | POA: Insufficient documentation

## 2020-02-04 DIAGNOSIS — C61 Malignant neoplasm of prostate: Secondary | ICD-10-CM | POA: Insufficient documentation

## 2020-02-04 LAB — COMPREHENSIVE METABOLIC PANEL
ALT: 14 U/L (ref 0–44)
AST: 20 U/L (ref 15–41)
Albumin: 3.9 g/dL (ref 3.5–5.0)
Alkaline Phosphatase: 39 U/L (ref 38–126)
Anion gap: 8 (ref 5–15)
BUN: 23 mg/dL (ref 8–23)
CO2: 26 mmol/L (ref 22–32)
Calcium: 9.5 mg/dL (ref 8.9–10.3)
Chloride: 104 mmol/L (ref 98–111)
Creatinine, Ser: 1.47 mg/dL — ABNORMAL HIGH (ref 0.61–1.24)
GFR, Estimated: 45 mL/min — ABNORMAL LOW (ref >60)
Glucose, Bld: 97 mg/dL (ref 70–99)
Potassium: 4.1 mmol/L (ref 3.5–5.1)
Sodium: 138 mmol/L (ref 135–145)
Total Bilirubin: 1.2 mg/dL (ref 0.3–1.2)
Total Protein: 6.8 g/dL (ref 6.5–8.1)

## 2020-02-04 MED ORDER — DENOSUMAB 120 MG/1.7ML ~~LOC~~ SOLN
120.0000 mg | Freq: Once | SUBCUTANEOUS | Status: AC
  Administered 2020-02-04: 120 mg via SUBCUTANEOUS

## 2020-02-04 MED ORDER — DENOSUMAB 60 MG/ML ~~LOC~~ SOSY
PREFILLED_SYRINGE | SUBCUTANEOUS | Status: AC
  Filled 2020-02-04: qty 1

## 2020-02-04 MED ORDER — DENOSUMAB 120 MG/1.7ML ~~LOC~~ SOLN
SUBCUTANEOUS | Status: AC
  Filled 2020-02-04: qty 1.7

## 2020-02-04 MED ORDER — CYANOCOBALAMIN 1000 MCG/ML IJ SOLN
INTRAMUSCULAR | Status: AC
  Filled 2020-02-04: qty 1

## 2020-02-04 NOTE — Progress Notes (Signed)
Carlos Harper presents today for injection per the provider's orders.  Xgeva administration without incident; injection site WNL; see MAR for injection details.  Patient tolerated procedure well and without incident.  No questions or complaints noted at this time. Discharged ambulatory in stable condition.

## 2020-02-05 MED ORDER — PEGFILGRASTIM-CBQV 6 MG/0.6ML ~~LOC~~ SOSY
PREFILLED_SYRINGE | SUBCUTANEOUS | Status: AC
  Filled 2020-02-05: qty 0.6

## 2020-02-05 MED ORDER — LANREOTIDE ACETATE 120 MG/0.5ML ~~LOC~~ SOLN
SUBCUTANEOUS | Status: AC
  Filled 2020-02-05: qty 120

## 2020-02-10 ENCOUNTER — Other Ambulatory Visit (HOSPITAL_COMMUNITY): Payer: Self-pay | Admitting: *Deleted

## 2020-02-10 DIAGNOSIS — C61 Malignant neoplasm of prostate: Secondary | ICD-10-CM

## 2020-02-10 MED ORDER — ABIRATERONE ACETATE 250 MG PO TABS: 1000.0000 mg | ORAL_TABLET | Freq: Every day | ORAL | 0 refills | Status: DC

## 2020-02-11 MED FILL — predniSONE 5 MG TABS: 5 | 30 days supply | Qty: 30 | Fill #1

## 2020-02-12 ENCOUNTER — Other Ambulatory Visit (HOSPITAL_COMMUNITY): Payer: Self-pay | Admitting: Pharmacist

## 2020-02-12 DIAGNOSIS — C61 Malignant neoplasm of prostate: Secondary | ICD-10-CM

## 2020-02-12 MED ORDER — ABIRATERONE ACETATE 250 MG PO TABS: 1000.0000 mg | ORAL_TABLET | Freq: Every day | ORAL | 0 refills | Status: DC

## 2020-02-12 MED FILL — ABIRATERONE ACETATE 250 MG: 250 | 30 days supply | Qty: 120 | Fill #0

## 2020-02-12 NOTE — Progress Notes (Signed)
Zytiga prescription sent to local [facility] in error. Pt fills Zytiga at [facility] - prescription redirected there.

## 2020-02-15 ENCOUNTER — Other Ambulatory Visit (HOSPITAL_COMMUNITY): Payer: Self-pay

## 2020-02-15 ENCOUNTER — Other Ambulatory Visit (HOSPITAL_COMMUNITY): Payer: Self-pay | Admitting: *Deleted

## 2020-02-15 DIAGNOSIS — C61 Malignant neoplasm of prostate: Secondary | ICD-10-CM

## 2020-02-17 IMAGING — US US RENAL
1 series · 14 of 25 positions shown · non-contrast
Comparison: None.

CLINICAL DATA: Hydronephrosis with UPJ obstruction. Follow-up.
Stenting on [REDACTED]. History of prostate cancer and hypertension.

EXAM:
RENAL / URINARY TRACT ULTRASOUND COMPLETE

[Series 1: us renal · 0.19mm/px · 14 of 36 slices shown]
[im 1/36]
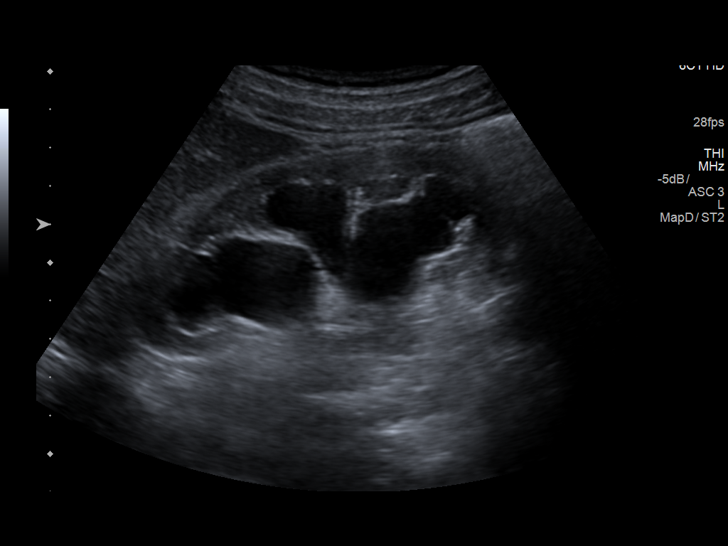
[im 3/36]
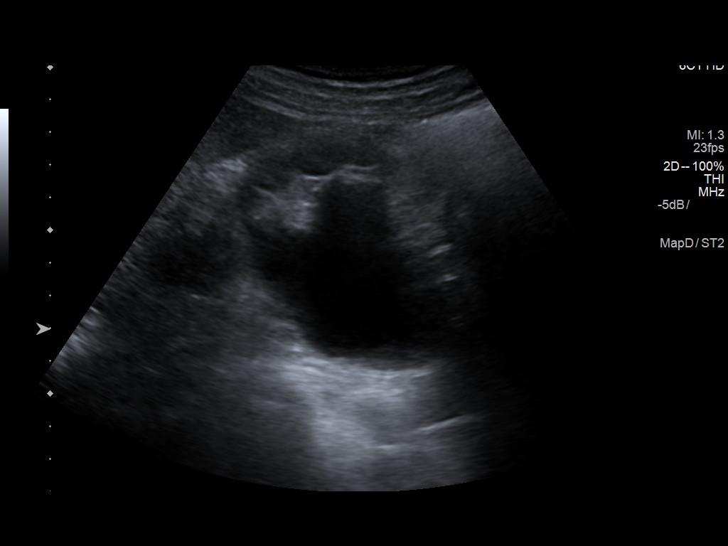
[im 6/36]
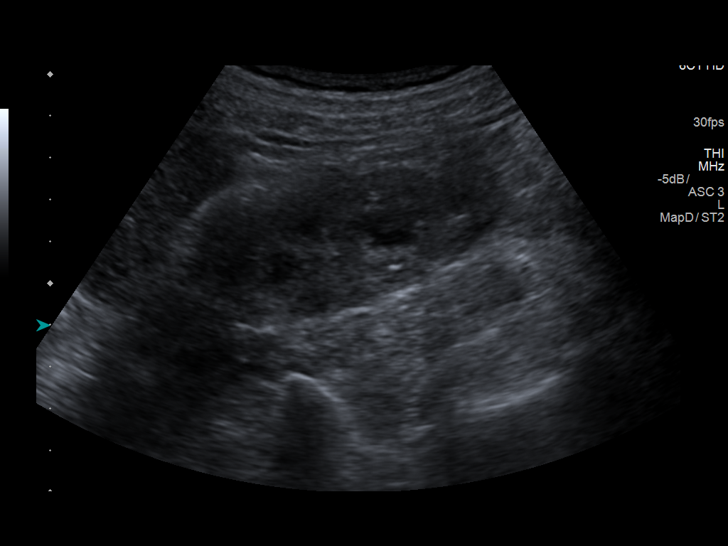
[im 9/36]
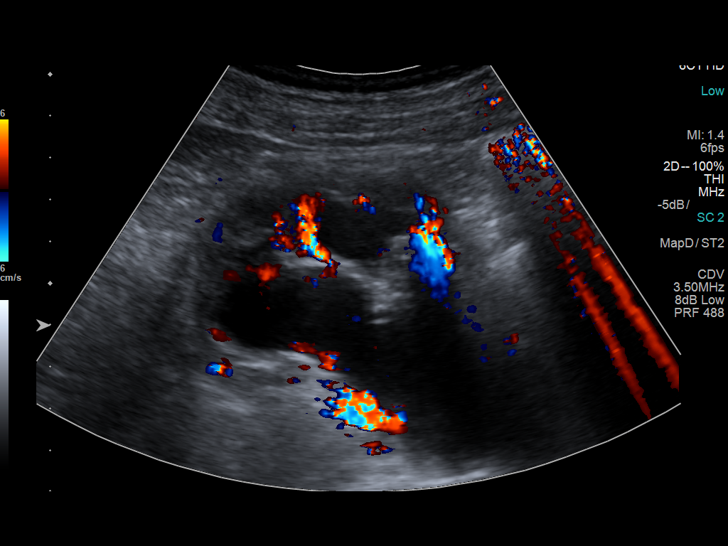
[im 12/36]
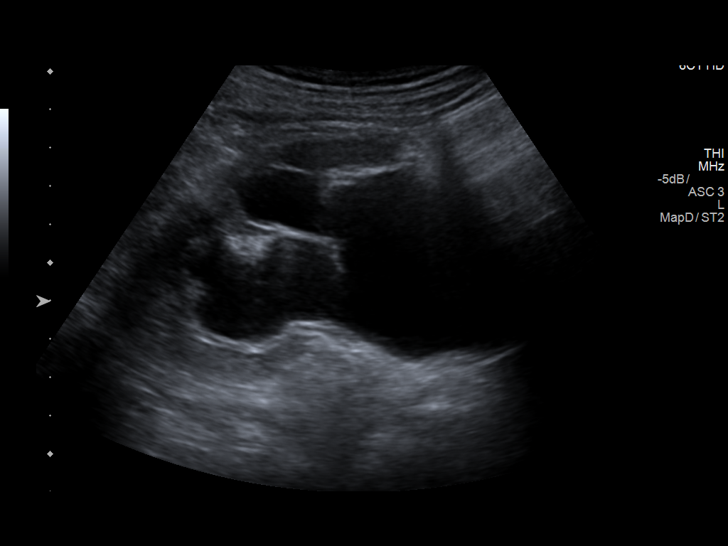
[im 14/36]
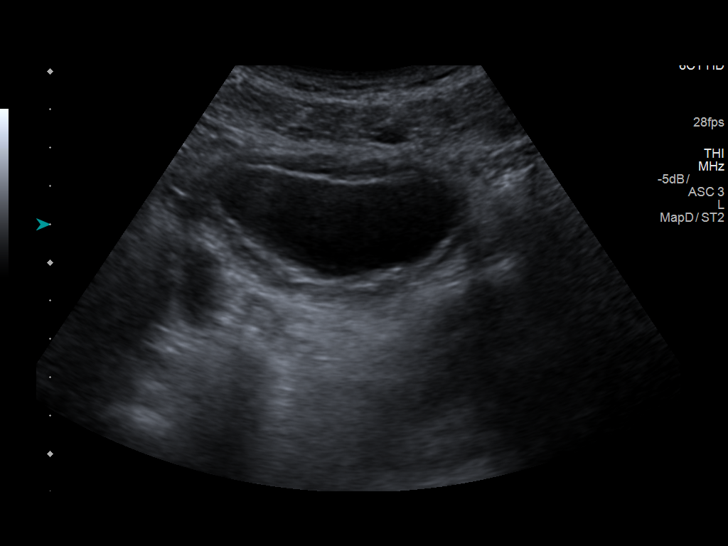
[im 17/36]
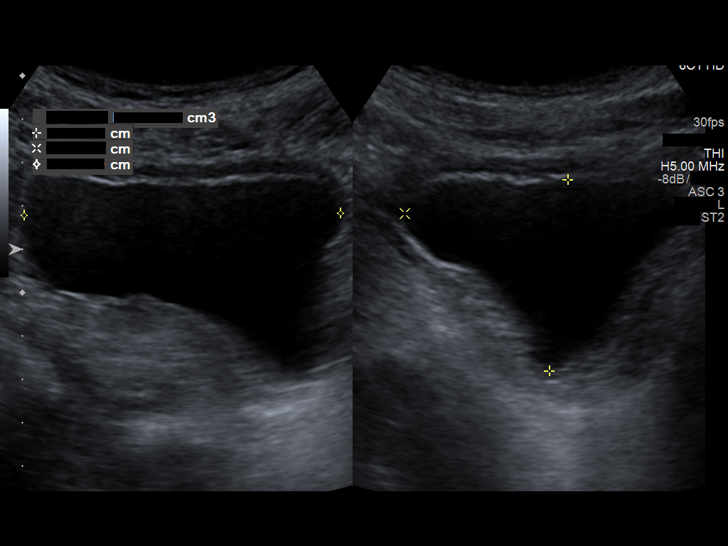
[im 19/36]
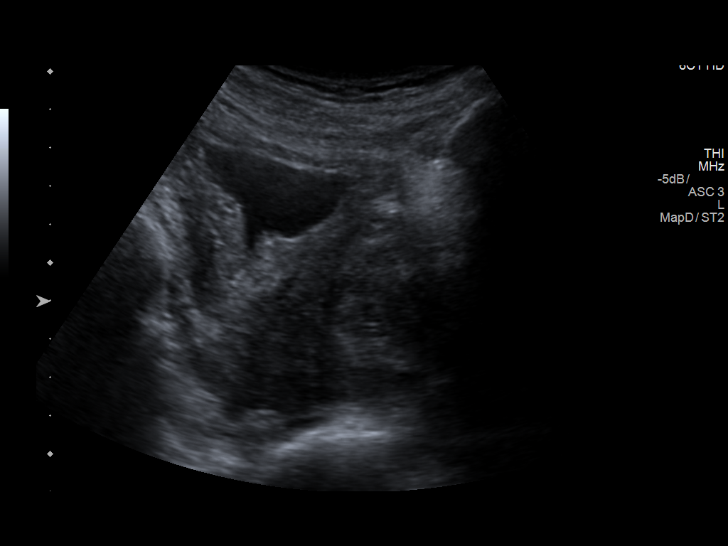
[im 22/36]
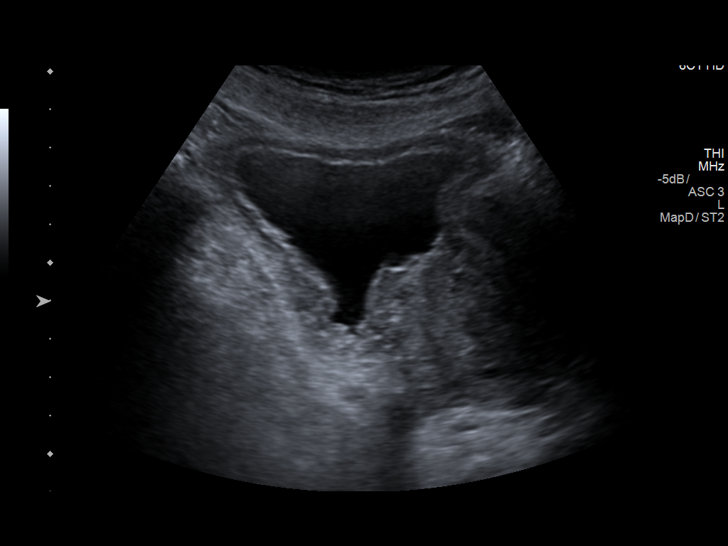
[im 24/36]
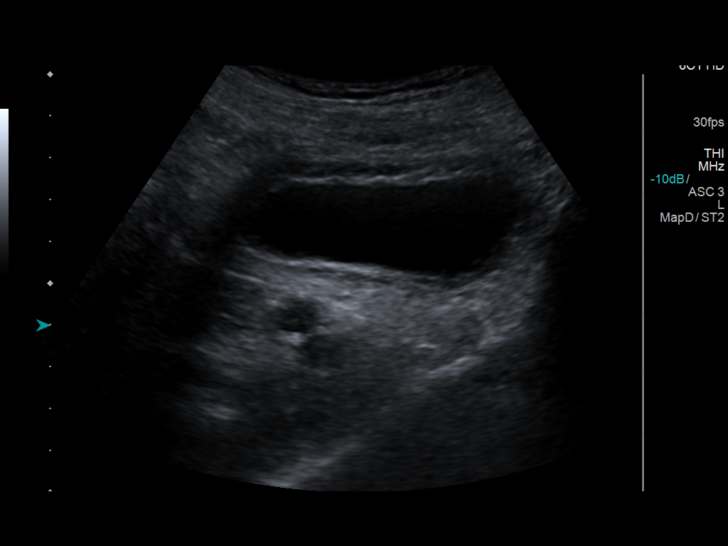
[im 27/36]
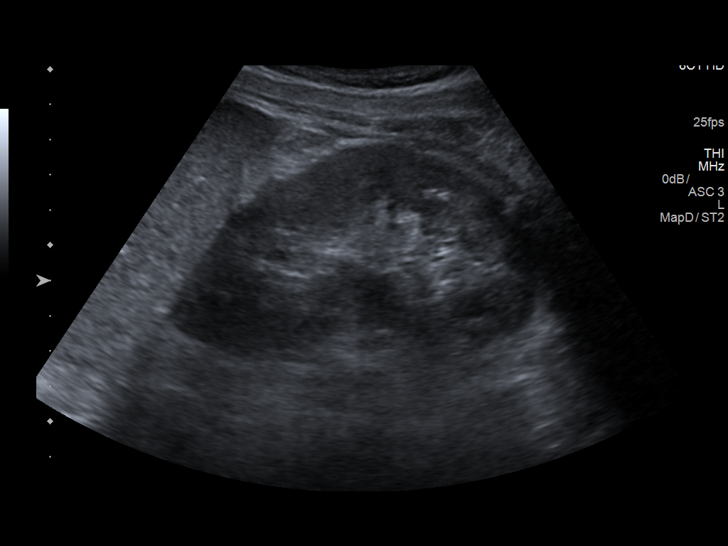
[im 30/36]
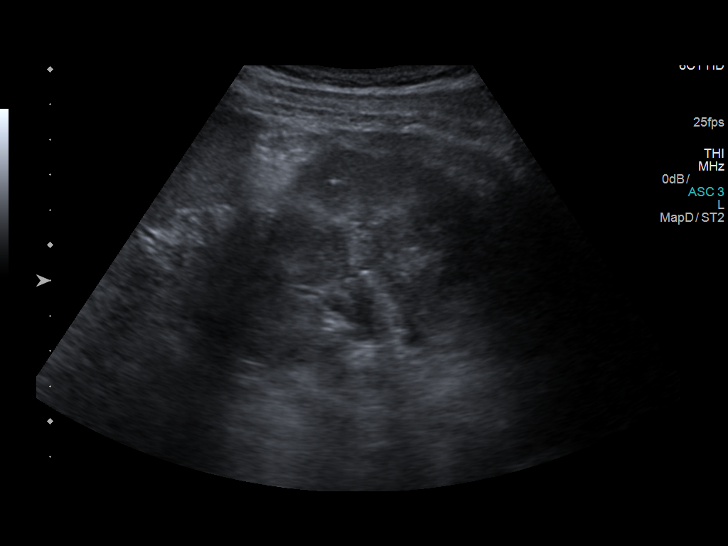
[im 33/36]
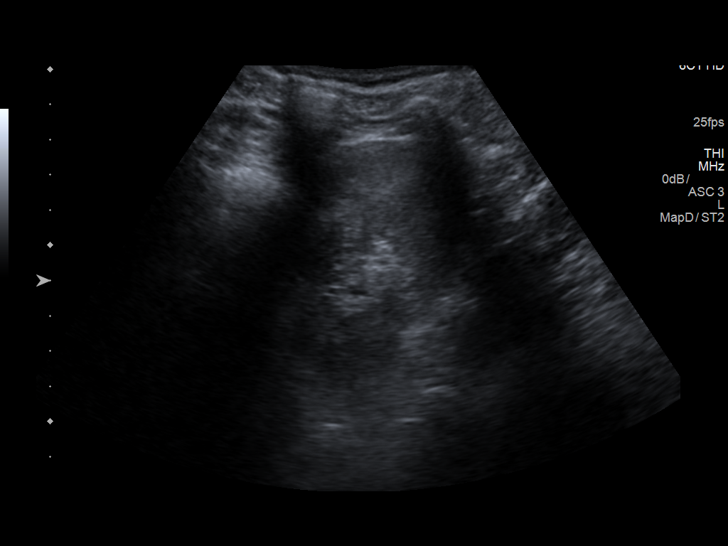
[im 36/36]
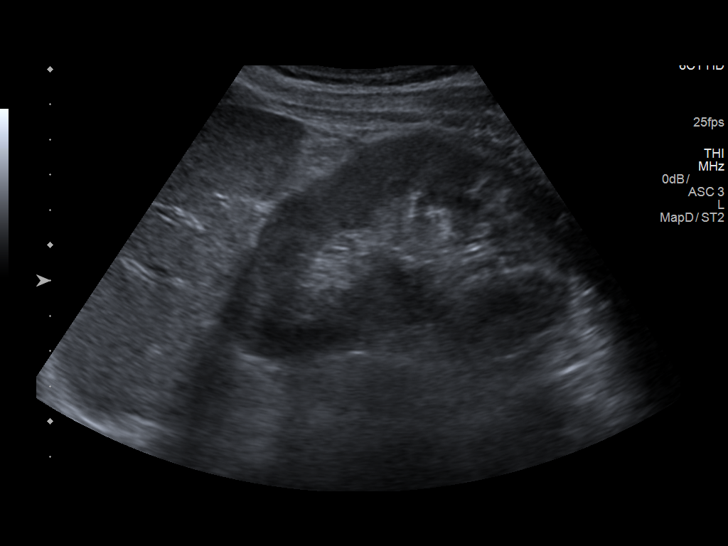

[14 of 25 positions shown; findings below may reference images not displayed]

FINDINGS: Right Kidney:

Length: 9.9 cm. Hydronephrosis, moderate to severe. No renal mass
seen. Normal echogenicity of the renal cortex.

Left Kidney:

Length: 9.5 cm. Echogenicity within normal limits. No mass or
hydronephrosis visualized.

Bladder:

Unremarkable.  Volume of 107 cc.  Patient unable to void.
IMPRESSION: 1. RIGHT-sided hydronephrosis, moderate to severe, similar to the
degree of hydronephrosis seen on CT abdomen of [DATE].
2. Normal LEFT kidney.
3. Prostate is enlarged with a volume of 109 cc.

## 2020-02-29 ENCOUNTER — Inpatient Hospital Stay (HOSPITAL_COMMUNITY): Payer: Medicaid Other | Attending: Hematology

## 2020-02-29 DIAGNOSIS — C61 Malignant neoplasm of prostate: Secondary | ICD-10-CM | POA: Insufficient documentation

## 2020-02-29 DIAGNOSIS — C7951 Secondary malignant neoplasm of bone: Secondary | ICD-10-CM | POA: Insufficient documentation

## 2020-03-03 ENCOUNTER — Ambulatory Visit (HOSPITAL_COMMUNITY): Payer: Medicaid Other | Admitting: Hematology

## 2020-03-03 ENCOUNTER — Other Ambulatory Visit: Payer: Self-pay

## 2020-03-03 ENCOUNTER — Encounter (HOSPITAL_COMMUNITY): Payer: Self-pay

## 2020-03-03 ENCOUNTER — Inpatient Hospital Stay (HOSPITAL_COMMUNITY): Payer: Medicaid Other

## 2020-03-03 VITALS — BP 150/76 | HR 109 | Temp 97.2°F | Resp 18

## 2020-03-03 DIAGNOSIS — C61 Malignant neoplasm of prostate: Secondary | ICD-10-CM

## 2020-03-03 DIAGNOSIS — C7951 Secondary malignant neoplasm of bone: Secondary | ICD-10-CM | POA: Diagnosis present

## 2020-03-03 LAB — CBC WITH DIFFERENTIAL/PLATELET
Abs Immature Granulocytes: 0.01 10*3/uL (ref 0.00–0.07)
Basophils Absolute: 0 10*3/uL (ref 0.0–0.1)
Basophils Relative: 0 %
Eosinophils Absolute: 0 10*3/uL (ref 0.0–0.5)
Eosinophils Relative: 1 %
HCT: 35.3 % — ABNORMAL LOW (ref 39.0–52.0)
Hemoglobin: 11.8 g/dL — ABNORMAL LOW (ref 13.0–17.0)
Immature Granulocytes: 0 %
Lymphocytes Relative: 30 %
Lymphs Abs: 1.4 10*3/uL (ref 0.7–4.0)
MCH: 31.3 pg (ref 26.0–34.0)
MCHC: 33.4 g/dL (ref 30.0–36.0)
MCV: 93.6 fL (ref 80.0–100.0)
Monocytes Absolute: 0.3 10*3/uL (ref 0.1–1.0)
Monocytes Relative: 7 %
Neutro Abs: 2.9 10*3/uL (ref 1.7–7.7)
Neutrophils Relative %: 62 %
Platelets: 212 10*3/uL (ref 150–400)
RBC: 3.77 MIL/uL — ABNORMAL LOW (ref 4.22–5.81)
RDW: 13.2 % (ref 11.5–15.5)
WBC: 4.6 10*3/uL (ref 4.0–10.5)
nRBC: 0 % (ref 0.0–0.2)

## 2020-03-03 LAB — COMPREHENSIVE METABOLIC PANEL
ALT: 14 U/L (ref 0–44)
AST: 16 U/L (ref 15–41)
Albumin: 4.1 g/dL (ref 3.5–5.0)
Alkaline Phosphatase: 42 U/L (ref 38–126)
Anion gap: 10 (ref 5–15)
BUN: 26 mg/dL — ABNORMAL HIGH (ref 8–23)
CO2: 23 mmol/L (ref 22–32)
Calcium: 9.4 mg/dL (ref 8.9–10.3)
Chloride: 105 mmol/L (ref 98–111)
Creatinine, Ser: 1.65 mg/dL — ABNORMAL HIGH (ref 0.61–1.24)
GFR, Estimated: 42 mL/min — ABNORMAL LOW (ref >60)
Glucose, Bld: 97 mg/dL (ref 70–99)
Potassium: 4 mmol/L (ref 3.5–5.1)
Sodium: 138 mmol/L (ref 135–145)
Total Bilirubin: 1.2 mg/dL (ref 0.3–1.2)
Total Protein: 7 g/dL (ref 6.5–8.1)

## 2020-03-03 LAB — FERRITIN: Ferritin: 251 ng/mL (ref 24–336)

## 2020-03-03 LAB — PSA: Prostatic Specific Antigen: 0.05 ng/mL (ref 0.00–4.00)

## 2020-03-03 LAB — IRON AND TIBC
Iron: 16 ug/dL — ABNORMAL LOW (ref 45–182)
Saturation Ratios: 5 % — ABNORMAL LOW (ref 17.9–39.5)
TIBC: 339 ug/dL (ref 250–450)
UIBC: 323 ug/dL

## 2020-03-03 MED ORDER — DENOSUMAB 120 MG/1.7ML ~~LOC~~ SOLN
120.0000 mg | Freq: Once | SUBCUTANEOUS | Status: AC
  Administered 2020-03-03: 120 mg via SUBCUTANEOUS

## 2020-03-03 MED ORDER — DENOSUMAB 120 MG/1.7ML ~~LOC~~ SOLN
SUBCUTANEOUS | Status: AC
  Filled 2020-03-03: qty 1.7

## 2020-03-03 NOTE — Patient Instructions (Signed)
[facility] at [facility] Discharge Instructions  Received Xgeva injection today. Follow-up as scheduled   Thank you for choosing [facility] at [facility] to provide your oncology and hematology care.  To afford each patient quality time with our provider, please arrive at least 15 minutes before your scheduled appointment time.   If you have a lab appointment with the Cancer Center please come in thru the Main Entrance and check in at the main information desk.  You need to re-schedule your appointment should you arrive 10 or more minutes late.  We strive to give you quality time with our providers, and arriving late affects you and other patients whose appointments are after yours.  Also, if you no show three or more times for appointments you may be dismissed from the clinic at the providers discretion.     Again, thank you for choosing [facility].  Our hope is that these requests will decrease the amount of time that you wait before being seen by our physicians.       _____________________________________________________________  Should you have questions after your visit to [facility], please contact our office at [phone] and follow the prompts.  Our office hours are 8:00 a.m. and 4:30 p.m. Monday - Friday.  Please note that voicemails left after 4:00 p.m. may not be returned until the following business day.  We are closed weekends and major holidays.  You do have access to a nurse 24-7, just call the main number to the clinic [phone] and do not press any options, hold on the line and a nurse will answer the phone.    For prescription refill requests, have your pharmacy contact our office and allow 72 hours.    Due to Covid, you will need to wear a mask upon entering the hospital. If you do not have a mask, a mask will be given to you at the Main Entrance upon arrival. For doctor visits, patients may have 1  support person age 18 or older with them. For treatment visits, patients can not have anyone with them due to social distancing guidelines and our immunocompromised population.     

## 2020-03-03 NOTE — Progress Notes (Signed)
Carlos Harper tolerated Xgeva injection well without complaints or incident. Calcium 9.4 today and pt denied any tooth or jaw pain and no recent or future dental visits prior to administering this medication. VSS.Pt discharged self ambulatory in satisfactory condition

## 2020-03-07 MED FILL — predniSONE 5 MG TABS: 5 | 30 days supply | Qty: 30 | Fill #2

## 2020-03-28 ENCOUNTER — Other Ambulatory Visit (HOSPITAL_COMMUNITY): Payer: Self-pay | Admitting: Hematology

## 2020-03-28 ENCOUNTER — Other Ambulatory Visit (HOSPITAL_COMMUNITY): Payer: Medicaid Other

## 2020-03-28 DIAGNOSIS — C61 Malignant neoplasm of prostate: Secondary | ICD-10-CM

## 2020-03-30 ENCOUNTER — Other Ambulatory Visit (HOSPITAL_COMMUNITY): Payer: Self-pay | Admitting: *Deleted

## 2020-03-30 ENCOUNTER — Other Ambulatory Visit (HOSPITAL_COMMUNITY): Payer: Self-pay

## 2020-03-30 DIAGNOSIS — C61 Malignant neoplasm of prostate: Secondary | ICD-10-CM

## 2020-03-30 MED ORDER — ABIRATERONE ACETATE 250 MG PO TABS: 1000.0000 mg | ORAL_TABLET | Freq: Every day | ORAL | 0 refills | Status: DC

## 2020-03-30 MED FILL — ABIRATERONE ACETATE 250 MG: 250 | 30 days supply | Qty: 120 | Fill #0

## 2020-03-31 ENCOUNTER — Encounter (HOSPITAL_COMMUNITY): Payer: Self-pay | Admitting: Oncology

## 2020-03-31 ENCOUNTER — Ambulatory Visit (HOSPITAL_COMMUNITY): Payer: Medicaid Other | Admitting: Oncology

## 2020-03-31 ENCOUNTER — Inpatient Hospital Stay (HOSPITAL_BASED_OUTPATIENT_CLINIC_OR_DEPARTMENT_OTHER): Payer: Medicaid Other | Admitting: Oncology

## 2020-03-31 ENCOUNTER — Inpatient Hospital Stay (HOSPITAL_COMMUNITY): Payer: Medicaid Other | Attending: Hematology and Oncology

## 2020-03-31 ENCOUNTER — Other Ambulatory Visit (HOSPITAL_COMMUNITY): Payer: Medicaid Other

## 2020-03-31 ENCOUNTER — Ambulatory Visit (HOSPITAL_COMMUNITY): Payer: Medicaid Other

## 2020-03-31 ENCOUNTER — Other Ambulatory Visit: Payer: Self-pay

## 2020-03-31 ENCOUNTER — Inpatient Hospital Stay (HOSPITAL_COMMUNITY): Payer: Medicaid Other

## 2020-03-31 VITALS — BP 156/97 | HR 64 | Temp 98.3°F | Resp 17 | Wt 179.6 lb

## 2020-03-31 DIAGNOSIS — D573 Sickle-cell trait: Secondary | ICD-10-CM | POA: Diagnosis not present

## 2020-03-31 DIAGNOSIS — D631 Anemia in chronic kidney disease: Secondary | ICD-10-CM | POA: Insufficient documentation

## 2020-03-31 DIAGNOSIS — K5900 Constipation, unspecified: Secondary | ICD-10-CM | POA: Insufficient documentation

## 2020-03-31 DIAGNOSIS — C61 Malignant neoplasm of prostate: Secondary | ICD-10-CM | POA: Insufficient documentation

## 2020-03-31 DIAGNOSIS — M549 Dorsalgia, unspecified: Secondary | ICD-10-CM | POA: Insufficient documentation

## 2020-03-31 DIAGNOSIS — R42 Dizziness and giddiness: Secondary | ICD-10-CM | POA: Insufficient documentation

## 2020-03-31 DIAGNOSIS — R202 Paresthesia of skin: Secondary | ICD-10-CM | POA: Insufficient documentation

## 2020-03-31 DIAGNOSIS — C7951 Secondary malignant neoplasm of bone: Secondary | ICD-10-CM | POA: Insufficient documentation

## 2020-03-31 DIAGNOSIS — I129 Hypertensive chronic kidney disease with stage 1 through stage 4 chronic kidney disease, or unspecified chronic kidney disease: Secondary | ICD-10-CM | POA: Insufficient documentation

## 2020-03-31 DIAGNOSIS — N189 Chronic kidney disease, unspecified: Secondary | ICD-10-CM | POA: Insufficient documentation

## 2020-03-31 DIAGNOSIS — R200 Anesthesia of skin: Secondary | ICD-10-CM | POA: Diagnosis not present

## 2020-03-31 DIAGNOSIS — M25559 Pain in unspecified hip: Secondary | ICD-10-CM | POA: Insufficient documentation

## 2020-03-31 LAB — COMPREHENSIVE METABOLIC PANEL
ALT: 13 U/L (ref 0–44)
AST: 16 U/L (ref 15–41)
Albumin: 4 g/dL (ref 3.5–5.0)
Alkaline Phosphatase: 47 U/L (ref 38–126)
Anion gap: 8 (ref 5–15)
BUN: 15 mg/dL (ref 8–23)
CO2: 26 mmol/L (ref 22–32)
Calcium: 8.8 mg/dL — ABNORMAL LOW (ref 8.9–10.3)
Chloride: 105 mmol/L (ref 98–111)
Creatinine, Ser: 1.44 mg/dL — ABNORMAL HIGH (ref 0.61–1.24)
GFR, Estimated: 49 mL/min — ABNORMAL LOW (ref >60)
Glucose, Bld: 91 mg/dL (ref 70–99)
Potassium: 3.4 mmol/L — ABNORMAL LOW (ref 3.5–5.1)
Sodium: 139 mmol/L (ref 135–145)
Total Bilirubin: 1.2 mg/dL (ref 0.3–1.2)
Total Protein: 7 g/dL (ref 6.5–8.1)

## 2020-03-31 LAB — CBC WITH DIFFERENTIAL/PLATELET
Abs Immature Granulocytes: 0.01 10*3/uL (ref 0.00–0.07)
Basophils Absolute: 0 10*3/uL (ref 0.0–0.1)
Basophils Relative: 1 %
Eosinophils Absolute: 0 10*3/uL (ref 0.0–0.5)
Eosinophils Relative: 1 %
HCT: 37.5 % — ABNORMAL LOW (ref 39.0–52.0)
Hemoglobin: 12.6 g/dL — ABNORMAL LOW (ref 13.0–17.0)
Immature Granulocytes: 0 %
Lymphocytes Relative: 48 %
Lymphs Abs: 2.1 10*3/uL (ref 0.7–4.0)
MCH: 31.6 pg (ref 26.0–34.0)
MCHC: 33.6 g/dL (ref 30.0–36.0)
MCV: 94 fL (ref 80.0–100.0)
Monocytes Absolute: 0.4 10*3/uL (ref 0.1–1.0)
Monocytes Relative: 8 %
Neutro Abs: 1.8 10*3/uL (ref 1.7–7.7)
Neutrophils Relative %: 42 %
Platelets: 231 10*3/uL (ref 150–400)
RBC: 3.99 MIL/uL — ABNORMAL LOW (ref 4.22–5.81)
RDW: 13.4 % (ref 11.5–15.5)
WBC: 4.4 10*3/uL (ref 4.0–10.5)
nRBC: 0 % (ref 0.0–0.2)

## 2020-03-31 LAB — PSA: Prostatic Specific Antigen: 0.17 ng/mL (ref 0.00–4.00)

## 2020-03-31 MED ORDER — DENOSUMAB 120 MG/1.7ML ~~LOC~~ SOLN
120.0000 mg | Freq: Once | SUBCUTANEOUS | Status: AC
  Administered 2020-03-31: 120 mg via SUBCUTANEOUS
  Filled 2020-03-31: qty 1.7

## 2020-03-31 MED FILL — predniSONE 5 MG TABS: 5 | 30 days supply | Qty: 30 | Fill #3

## 2020-03-31 NOTE — Progress Notes (Signed)
.  Carlos Harper presents today for injection per the provider's orders. Calcium levels 8.8  Xgeva administrated without incident; injection site WNL; see MAR for injection details. No jaw pain and no recent or future dental appointments.  Patient tolerated procedure well and without incident.  No questions or complaints noted at this time. VSS pt discharged self ambulatory in satisfactory condition.
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
## 2020-04-01 IMAGING — US US RENAL
1 series · 14 of 25 positions shown · non-contrast
Comparison: 10/11/2017

CLINICAL DATA: Follow-up hydronephrosis

EXAM:
RENAL / URINARY TRACT ULTRASOUND COMPLETE

[Series 1: us renal · 0.19mm/px · 14 of 71 slices shown]
[im 1/71]
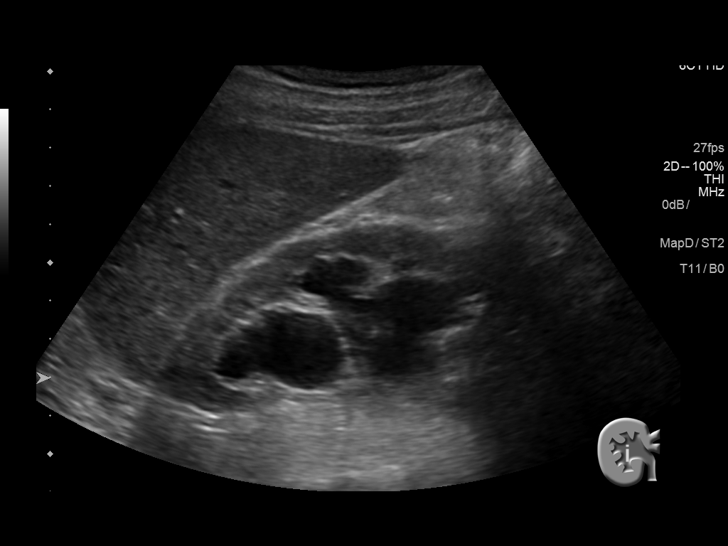
[im 6/71]
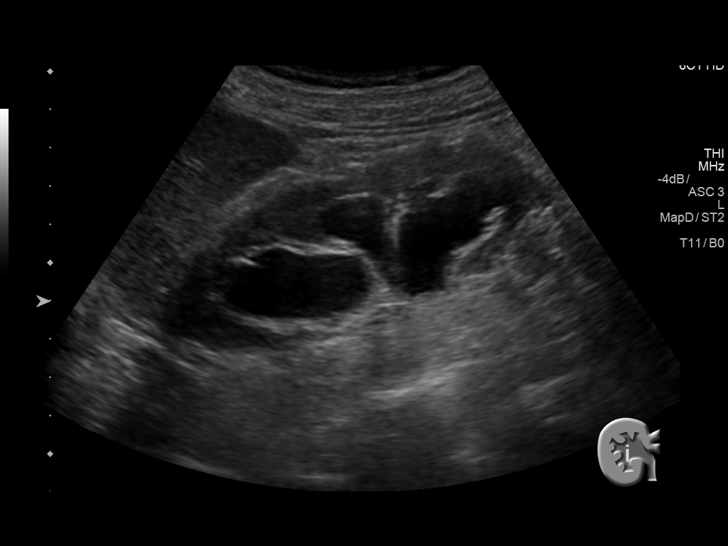
[im 12/71]
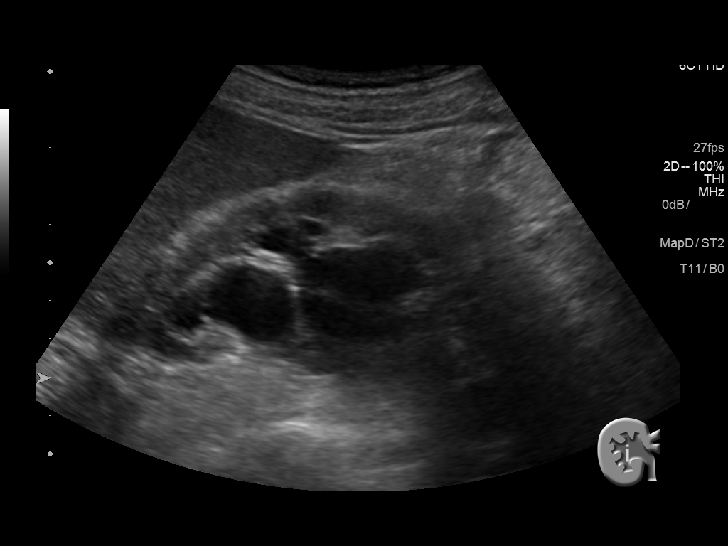
[im 18/71]
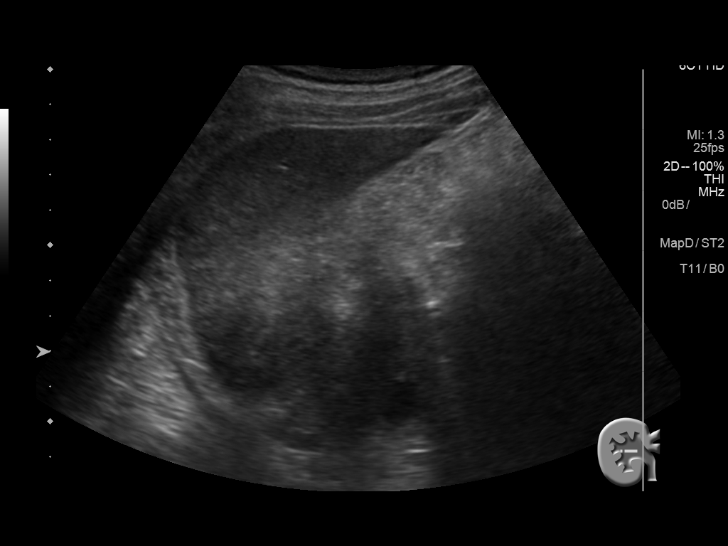
[im 24/71]
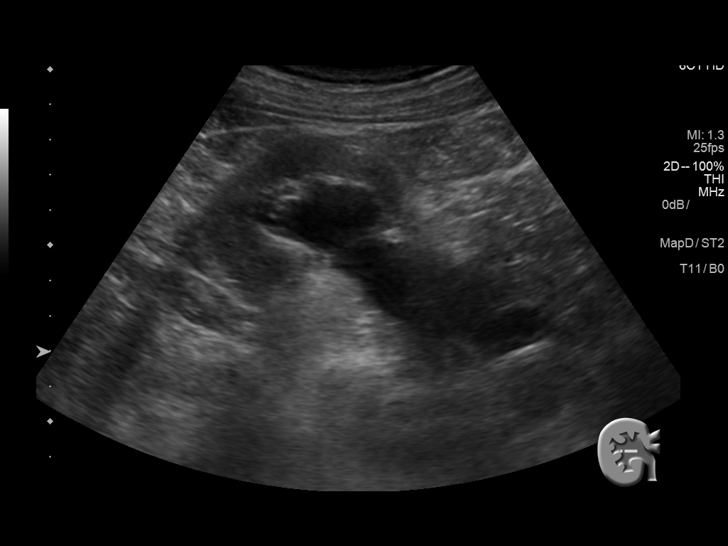
[im 27/71]
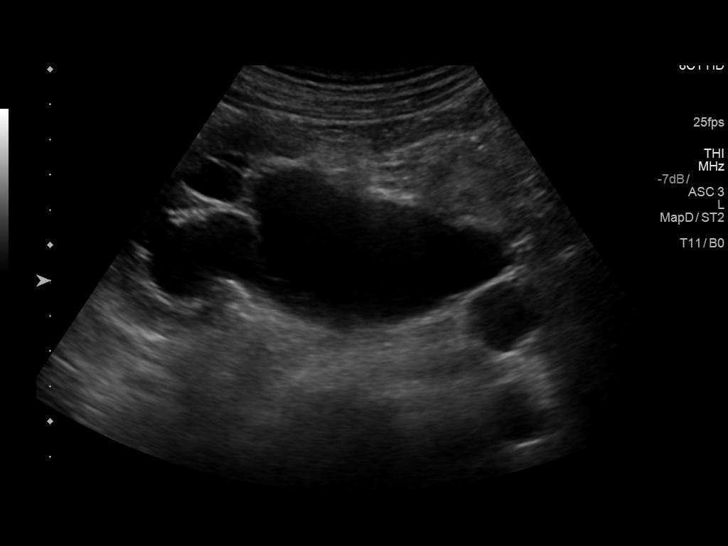
[im 33/71]
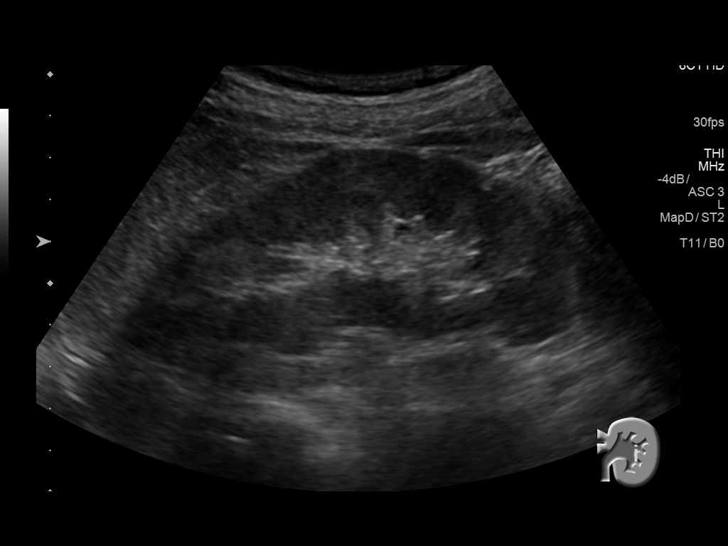
[im 38/71]
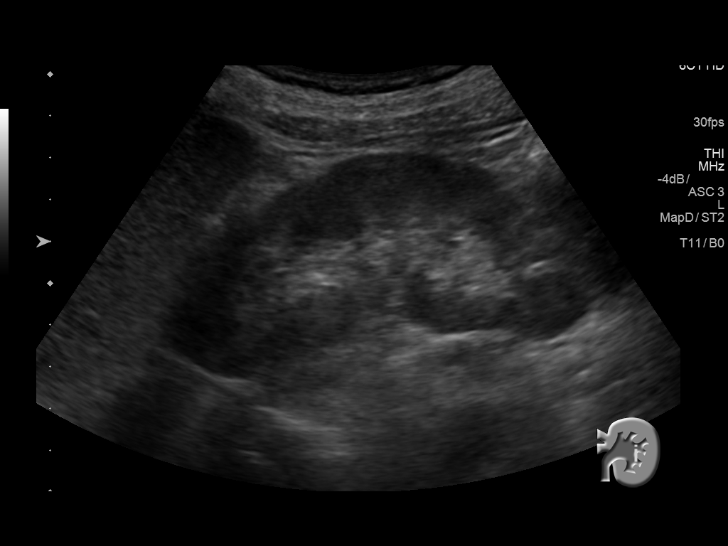
[im 44/71]
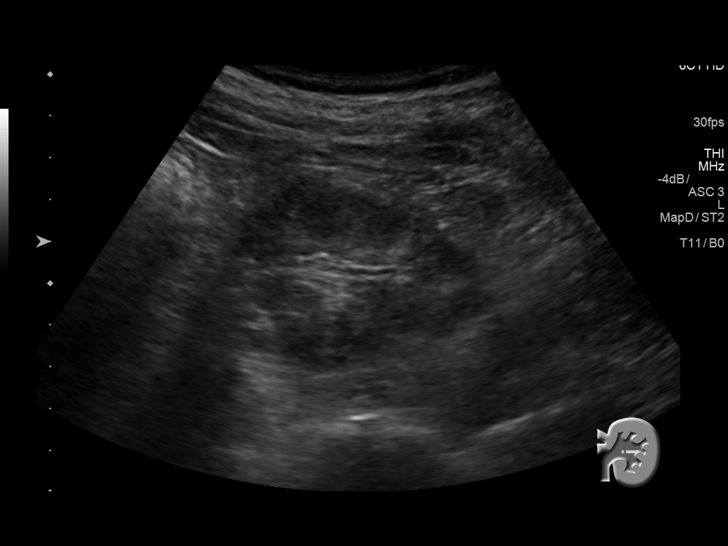
[im 47/71]
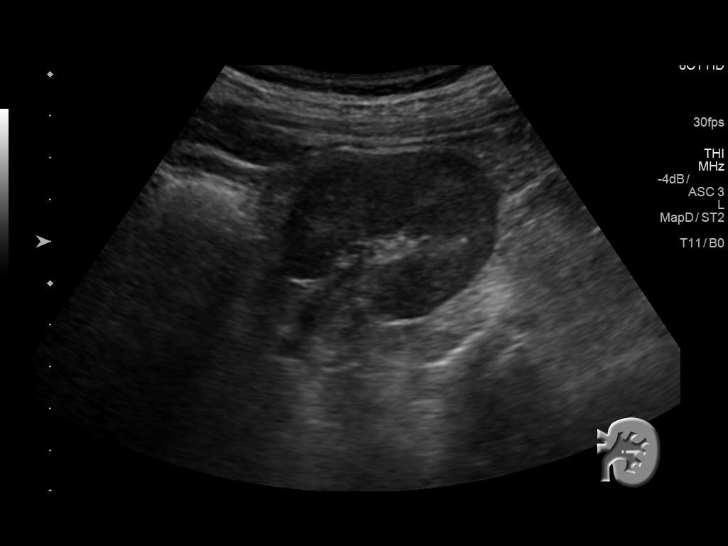
[im 53/71]
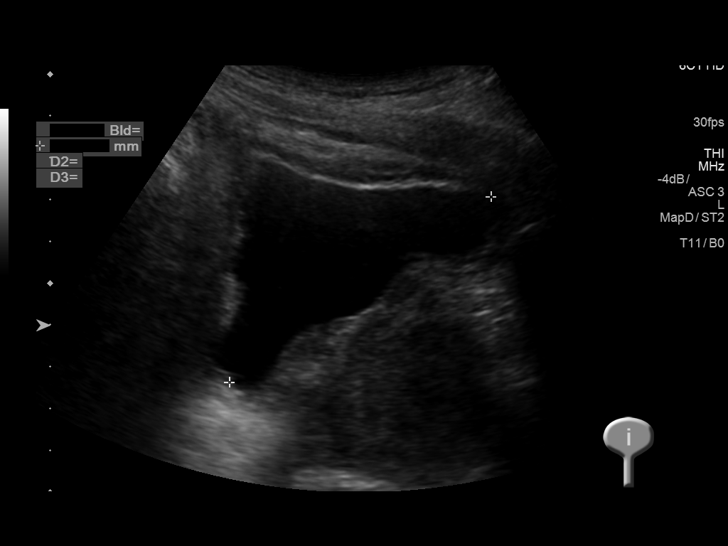
[im 59/71]
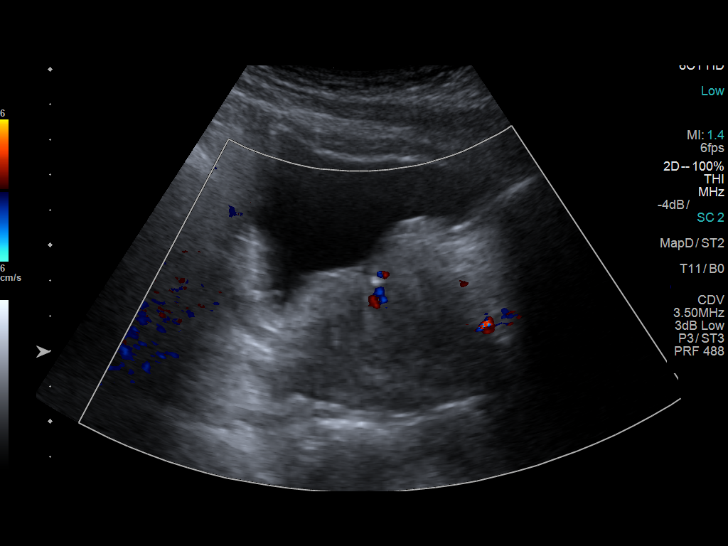
[im 65/71]
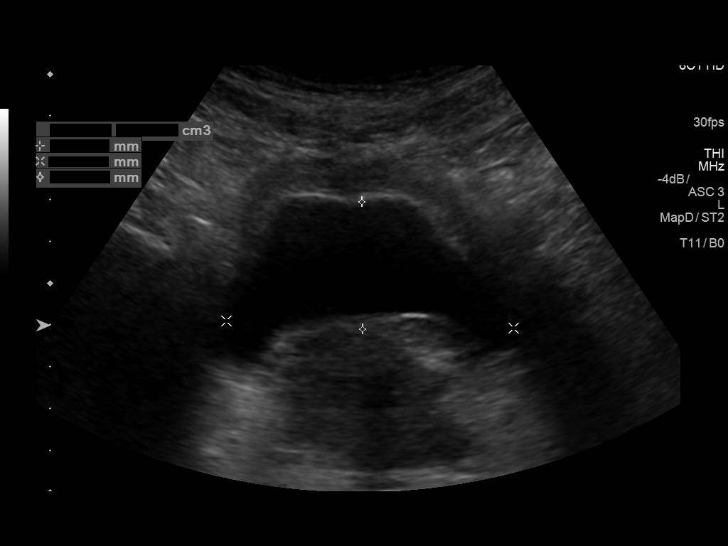
[im 71/71]
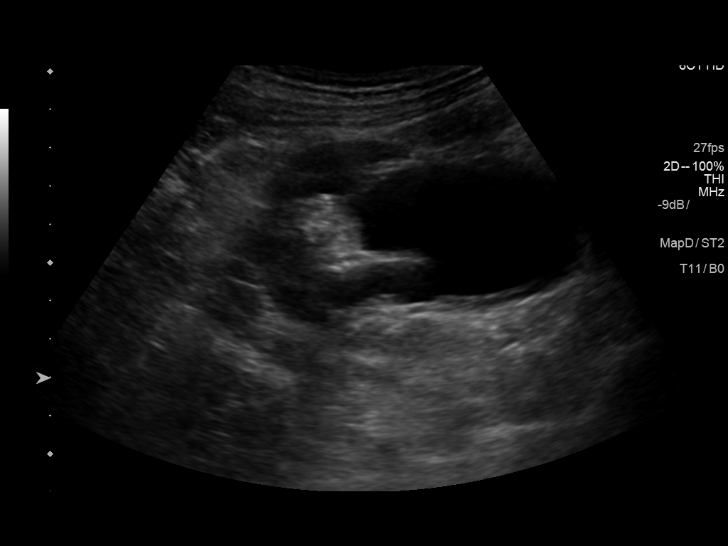

[14 of 25 positions shown; findings below may reference images not displayed]

FINDINGS: Right Kidney:

Length: 10.7 cm.. Persistent significant right hydronephrosis is
noted both pre and post voiding. These changes are stable from the
prior CT examination from 09/12/2017 and likely related to distal
ureteral obstruction related to the patient's known prostate
carcinoma.

Left Kidney:

Length: 11 cm.. Echogenicity within normal limits. No mass or
hydronephrosis visualized.

Bladder:

Appears normal for degree of bladder distention.
IMPRESSION: Significant right hydronephrosis stable in appearance from prior CT
examination from 09/12/2017 likely related to bladder wall
thickening and localized invasion from the patient's known prostate
carcinoma.

## 2020-04-19 ENCOUNTER — Other Ambulatory Visit (HOSPITAL_COMMUNITY): Payer: Self-pay | Admitting: Hematology and Oncology

## 2020-04-19 DIAGNOSIS — C61 Malignant neoplasm of prostate: Secondary | ICD-10-CM

## 2020-04-20 NOTE — Telephone Encounter (Signed)
Carlos Harper,  Sending to you for review. Lorayne Marek, RN

## 2020-04-25 ENCOUNTER — Other Ambulatory Visit (HOSPITAL_COMMUNITY): Payer: Self-pay | Admitting: Hematology

## 2020-04-25 NOTE — Telephone Encounter (Signed)
Dirk Dress, I saw this patient at [facility] while you were gone with met prostate cancer. Can you handle these refills? Thanks [employer]

## 2020-04-26 MED FILL — predniSONE 5 MG TABS: 5 | 30 days supply | Qty: 30 | Fill #4

## 2020-04-26 MED FILL — ABIRATERONE ACETATE 250 MG: 250 | 30 days supply | Qty: 120 | Fill #0

## 2020-04-28 ENCOUNTER — Inpatient Hospital Stay (HOSPITAL_COMMUNITY): Payer: Medicaid Other

## 2020-04-28 ENCOUNTER — Other Ambulatory Visit: Payer: Self-pay

## 2020-04-28 ENCOUNTER — Inpatient Hospital Stay (HOSPITAL_COMMUNITY): Payer: Medicaid Other | Attending: Hematology

## 2020-04-28 VITALS — BP 131/64 | HR 78 | Temp 96.8°F | Resp 18

## 2020-04-28 DIAGNOSIS — C61 Malignant neoplasm of prostate: Secondary | ICD-10-CM

## 2020-04-28 DIAGNOSIS — D631 Anemia in chronic kidney disease: Secondary | ICD-10-CM | POA: Insufficient documentation

## 2020-04-28 DIAGNOSIS — N189 Chronic kidney disease, unspecified: Secondary | ICD-10-CM | POA: Insufficient documentation

## 2020-04-28 DIAGNOSIS — I129 Hypertensive chronic kidney disease with stage 1 through stage 4 chronic kidney disease, or unspecified chronic kidney disease: Secondary | ICD-10-CM | POA: Diagnosis not present

## 2020-04-28 DIAGNOSIS — C7951 Secondary malignant neoplasm of bone: Secondary | ICD-10-CM | POA: Diagnosis not present

## 2020-04-28 LAB — COMPREHENSIVE METABOLIC PANEL
ALT: 19 U/L (ref 0–44)
AST: 28 U/L (ref 15–41)
Albumin: 3.5 g/dL (ref 3.5–5.0)
Alkaline Phosphatase: 56 U/L (ref 38–126)
Anion gap: 10 (ref 5–15)
BUN: 23 mg/dL (ref 8–23)
CO2: 24 mmol/L (ref 22–32)
Calcium: 8.7 mg/dL — ABNORMAL LOW (ref 8.9–10.3)
Chloride: 108 mmol/L (ref 98–111)
Creatinine, Ser: 1.44 mg/dL — ABNORMAL HIGH (ref 0.61–1.24)
GFR, Estimated: 49 mL/min — ABNORMAL LOW (ref >60)
Glucose, Bld: 103 mg/dL — ABNORMAL HIGH (ref 70–99)
Potassium: 3.6 mmol/L (ref 3.5–5.1)
Sodium: 142 mmol/L (ref 135–145)
Total Bilirubin: 1 mg/dL (ref 0.3–1.2)
Total Protein: 6.7 g/dL (ref 6.5–8.1)

## 2020-04-28 MED ORDER — DENOSUMAB 120 MG/1.7ML ~~LOC~~ SOLN
120.0000 mg | Freq: Once | SUBCUTANEOUS | Status: AC
  Administered 2020-04-28: 120 mg via SUBCUTANEOUS
  Filled 2020-04-28: qty 1.7

## 2020-04-28 NOTE — Progress Notes (Signed)
Patient tolerated Xgeva injection with no complaints voiced.  Site clean and dry with no bruising or swelling noted.  No complaints of pain.  Discharged with vital signs stable and no signs or symptoms of distress noted.  

## 2020-04-29 ENCOUNTER — Other Ambulatory Visit (HOSPITAL_COMMUNITY): Payer: Self-pay

## 2020-04-29 ENCOUNTER — Telehealth (HOSPITAL_COMMUNITY): Payer: Self-pay

## 2020-04-29 DIAGNOSIS — C61 Malignant neoplasm of prostate: Secondary | ICD-10-CM

## 2020-04-29 NOTE — Telephone Encounter (Signed)
Patients daughter Chrisandra Netters, called and left a message on 04/28/20 for Nursing office to return her call.  This nurse returned call to patients daughter this morning, when this nurse asked how can I assist her she did not say anything.  This nurse then called her name to make sure the connection was still secure and she responded by saying "Yes". This nurse asked her again was there something she needed pertaining to the patient.  Again Ms. Alroy Dust did not respond and disconnected the call.  This nurse attempted to return call to the patients daughter and there was no answer.

## 2020-05-02 ENCOUNTER — Other Ambulatory Visit: Payer: Self-pay

## 2020-05-02 ENCOUNTER — Inpatient Hospital Stay (HOSPITAL_BASED_OUTPATIENT_CLINIC_OR_DEPARTMENT_OTHER): Payer: Medicaid Other | Admitting: Hematology

## 2020-05-02 ENCOUNTER — Inpatient Hospital Stay (HOSPITAL_COMMUNITY): Payer: Medicaid Other

## 2020-05-02 ENCOUNTER — Other Ambulatory Visit (HOSPITAL_COMMUNITY): Payer: Self-pay

## 2020-05-02 VITALS — BP 145/80 | HR 71 | Temp 96.8°F | Resp 18 | Wt 176.4 lb

## 2020-05-02 DIAGNOSIS — C61 Malignant neoplasm of prostate: Secondary | ICD-10-CM

## 2020-05-02 LAB — COMPREHENSIVE METABOLIC PANEL
ALT: 19 U/L (ref 0–44)
AST: 16 U/L (ref 15–41)
Albumin: 3.5 g/dL (ref 3.5–5.0)
Alkaline Phosphatase: 64 U/L (ref 38–126)
Anion gap: 10 (ref 5–15)
BUN: 19 mg/dL (ref 8–23)
CO2: 24 mmol/L (ref 22–32)
Calcium: 8.6 mg/dL — ABNORMAL LOW (ref 8.9–10.3)
Chloride: 105 mmol/L (ref 98–111)
Creatinine, Ser: 1.46 mg/dL — ABNORMAL HIGH (ref 0.61–1.24)
GFR, Estimated: 48 mL/min — ABNORMAL LOW (ref >60)
Glucose, Bld: 107 mg/dL — ABNORMAL HIGH (ref 70–99)
Potassium: 3.3 mmol/L — ABNORMAL LOW (ref 3.5–5.1)
Sodium: 139 mmol/L (ref 135–145)
Total Bilirubin: 1.2 mg/dL (ref 0.3–1.2)
Total Protein: 7.1 g/dL (ref 6.5–8.1)

## 2020-05-02 LAB — CBC WITH DIFFERENTIAL/PLATELET
Abs Immature Granulocytes: 0.03 10*3/uL (ref 0.00–0.07)
Basophils Absolute: 0 10*3/uL (ref 0.0–0.1)
Basophils Relative: 1 %
Eosinophils Absolute: 0.1 10*3/uL (ref 0.0–0.5)
Eosinophils Relative: 2 %
HCT: 34.7 % — ABNORMAL LOW (ref 39.0–52.0)
Hemoglobin: 11.4 g/dL — ABNORMAL LOW (ref 13.0–17.0)
Immature Granulocytes: 1 %
Lymphocytes Relative: 33 %
Lymphs Abs: 1.6 10*3/uL (ref 0.7–4.0)
MCH: 30.8 pg (ref 26.0–34.0)
MCHC: 32.9 g/dL (ref 30.0–36.0)
MCV: 93.8 fL (ref 80.0–100.0)
Monocytes Absolute: 0.5 10*3/uL (ref 0.1–1.0)
Monocytes Relative: 10 %
Neutro Abs: 2.6 10*3/uL (ref 1.7–7.7)
Neutrophils Relative %: 53 %
Platelets: 304 10*3/uL (ref 150–400)
RBC: 3.7 MIL/uL — ABNORMAL LOW (ref 4.22–5.81)
RDW: 13.2 % (ref 11.5–15.5)
WBC: 4.9 10*3/uL (ref 4.0–10.5)
nRBC: 0 % (ref 0.0–0.2)

## 2020-05-02 LAB — PSA: Prostatic Specific Antigen: 0.17 ng/mL (ref 0.00–4.00)

## 2020-05-02 MED ORDER — DRONABINOL 5 MG PO CAPS: 5.0000 mg | ORAL_CAPSULE | Freq: Two times a day (BID) | ORAL | 1 refills | Status: DC
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
## 2020-05-02 NOTE — Patient Instructions (Signed)
[place] at [facility] Discharge Instructions  You were seen today by Dr. Delton Coombes. He went over your recent results. You will be scheduled for a CT of your abdomen and bone scan before your next visit. You will be prescribed Marinol 5 mg to take twice daily to improve your appetite. Dr. Delton Coombes will see you back in 3 weeks for follow up.   Thank you for choosing [place] at [facility] to provide your oncology and hematology care.  To afford each patient quality time with our provider, please arrive at least 15 minutes before your scheduled appointment time.   If you have a lab appointment with the [facility] please come in thru the Main Entrance and check in at the main information desk  You need to re-schedule your appointment should you arrive 10 or more minutes late.  We strive to give you quality time with our providers, and arriving late affects you and other patients whose appointments are after yours.  Also, if you no show three or more times for appointments you may be dismissed from the clinic at the providers discretion.     Again, thank you for choosing [facility].  Our hope is that these requests will decrease the amount of time that you wait before being seen by our physicians.       _____________________________________________________________  Should you have questions after your visit to [facility], please contact our office at [phone] between the hours of 8:00 a.m. and 4:30 p.m.  Voicemails left after 4:00 p.m. will not be returned until the following business day.  For prescription refill requests, have your pharmacy contact our office and allow 72 hours.    Cancer Center Support Programs:   > Cancer Support Group  2nd Tuesday of the month 1pm-2pm, Journey Room

## 2020-05-11 ENCOUNTER — Encounter (HOSPITAL_COMMUNITY): Payer: Medicaid Other

## 2020-05-24 ENCOUNTER — Ambulatory Visit (HOSPITAL_COMMUNITY): Payer: Medicaid Other

## 2020-05-26 ENCOUNTER — Encounter (HOSPITAL_COMMUNITY): Payer: Self-pay

## 2020-05-26 ENCOUNTER — Other Ambulatory Visit (HOSPITAL_COMMUNITY): Payer: Self-pay | Admitting: Hematology

## 2020-05-26 ENCOUNTER — Other Ambulatory Visit: Payer: Self-pay

## 2020-05-26 ENCOUNTER — Ambulatory Visit (HOSPITAL_COMMUNITY): Payer: Medicaid Other | Admitting: Hematology

## 2020-05-26 ENCOUNTER — Inpatient Hospital Stay (HOSPITAL_COMMUNITY): Payer: Medicaid Other

## 2020-05-26 ENCOUNTER — Inpatient Hospital Stay (HOSPITAL_COMMUNITY): Payer: Medicaid Other | Attending: Hematology

## 2020-05-26 VITALS — BP 150/85 | HR 68 | Temp 98.6°F | Resp 18

## 2020-05-26 DIAGNOSIS — C7951 Secondary malignant neoplasm of bone: Secondary | ICD-10-CM | POA: Insufficient documentation

## 2020-05-26 DIAGNOSIS — C61 Malignant neoplasm of prostate: Secondary | ICD-10-CM | POA: Diagnosis present

## 2020-05-26 LAB — COMPREHENSIVE METABOLIC PANEL
ALT: 13 U/L (ref 0–44)
AST: 17 U/L (ref 15–41)
Albumin: 4.1 g/dL (ref 3.5–5.0)
Alkaline Phosphatase: 48 U/L (ref 38–126)
Anion gap: 8 (ref 5–15)
BUN: 22 mg/dL (ref 8–23)
CO2: 26 mmol/L (ref 22–32)
Calcium: 9.6 mg/dL (ref 8.9–10.3)
Chloride: 104 mmol/L (ref 98–111)
Creatinine, Ser: 1.46 mg/dL — ABNORMAL HIGH (ref 0.61–1.24)
GFR, Estimated: 48 mL/min — ABNORMAL LOW (ref >60)
Glucose, Bld: 90 mg/dL (ref 70–99)
Potassium: 3.4 mmol/L — ABNORMAL LOW (ref 3.5–5.1)
Sodium: 138 mmol/L (ref 135–145)
Total Bilirubin: 1 mg/dL (ref 0.3–1.2)
Total Protein: 7.4 g/dL (ref 6.5–8.1)

## 2020-05-26 MED ORDER — DENOSUMAB 120 MG/1.7ML ~~LOC~~ SOLN
120.0000 mg | Freq: Once | SUBCUTANEOUS | Status: AC
  Administered 2020-05-26: 120 mg via SUBCUTANEOUS

## 2020-05-26 MED FILL — predniSONE 5 MG TABS: 5 | 30 days supply | Qty: 30 | Fill #5

## 2020-05-26 MED FILL — ABIRATERONE ACETATE 250 MG: 250 | 30 days supply | Qty: 120 | Fill #0

## 2020-05-26 NOTE — Progress Notes (Signed)
Carlos Harper tolerated Xgeva injection well without complaints or incident. Calcium 9.6 today and pt denied any tooth or jaw pain and no recent or future dental visits prior to administering this medication.Pt continues to take his Calcium PO as prescribed without issues. VSS Pt discharged self ambulatory in satisfactory condition

## 2020-05-26 NOTE — Patient Instructions (Signed)
[facility] at [facility] Discharge Instructions  Received Xgeva injection today. Follow-up as scheduled   Thank you for choosing [facility] at [facility] to provide your oncology and hematology care.  To afford each patient quality time with our provider, please arrive at least 15 minutes before your scheduled appointment time.   If you have a lab appointment with the Cancer Center please come in thru the Main Entrance and check in at the main information desk.  You need to re-schedule your appointment should you arrive 10 or more minutes late.  We strive to give you quality time with our providers, and arriving late affects you and other patients whose appointments are after yours.  Also, if you no show three or more times for appointments you may be dismissed from the clinic at the providers discretion.     Again, thank you for choosing [facility].  Our hope is that these requests will decrease the amount of time that you wait before being seen by our physicians.       _____________________________________________________________  Should you have questions after your visit to [facility], please contact our office at [phone] and follow the prompts.  Our office hours are 8:00 a.m. and 4:30 p.m. Monday - Friday.  Please note that voicemails left after 4:00 p.m. may not be returned until the following business day.  We are closed weekends and major holidays.  You do have access to a nurse 24-7, just call the main number to the clinic [phone] and do not press any options, hold on the line and a nurse will answer the phone.    For prescription refill requests, have your pharmacy contact our office and allow 72 hours.    Due to Covid, you will need to wear a mask upon entering the hospital. If you do not have a mask, a mask will be given to you at the Main Entrance upon arrival. For doctor visits, patients may have 1  support person age 18 or older with them. For treatment visits, patients can not have anyone with them due to social distancing guidelines and our immunocompromised population.     

## 2020-05-30 ENCOUNTER — Other Ambulatory Visit (HOSPITAL_COMMUNITY): Payer: Self-pay | Admitting: Hematology

## 2020-05-30 DIAGNOSIS — C61 Malignant neoplasm of prostate: Secondary | ICD-10-CM

## 2020-06-09 ENCOUNTER — Encounter (HOSPITAL_COMMUNITY): Payer: Self-pay

## 2020-06-09 ENCOUNTER — Ambulatory Visit (HOSPITAL_COMMUNITY)
Admission: RE | Admit: 2020-06-09 | Discharge: 2020-06-09 | Disposition: A | Discharge status: Home / Self Care | Payer: Medicaid Other | Source: Ambulatory Visit | Attending: Hematology | Admitting: Hematology

## 2020-06-09 ENCOUNTER — Encounter (HOSPITAL_COMMUNITY)
Admission: RE | Admit: 2020-06-09 | Discharge: 2020-06-09 | Disposition: A | Discharge status: Home / Self Care | Payer: Medicaid Other | Source: Ambulatory Visit | Attending: Hematology | Admitting: Hematology

## 2020-06-09 ENCOUNTER — Other Ambulatory Visit: Payer: Self-pay

## 2020-06-09 DIAGNOSIS — C61 Malignant neoplasm of prostate: Secondary | ICD-10-CM

## 2020-06-09 MED ORDER — TECHNETIUM TC 99M MEDRONATE IV KIT
20.0000 | PACK | Freq: Once | INTRAVENOUS | Status: AC | PRN
  Administered 2020-06-09: 21.5 via INTRAVENOUS

## 2020-06-09 MED ORDER — IOHEXOL 300 MG/ML  SOLN
100.0000 mL | Freq: Once | INTRAMUSCULAR | Status: AC | PRN
  Administered 2020-06-09: 100 mL via INTRAVENOUS

## 2020-06-23 ENCOUNTER — Inpatient Hospital Stay (HOSPITAL_COMMUNITY): Payer: Medicaid Other | Attending: Hematology

## 2020-06-23 ENCOUNTER — Inpatient Hospital Stay (HOSPITAL_COMMUNITY): Payer: Medicaid Other

## 2020-06-23 ENCOUNTER — Other Ambulatory Visit: Payer: Self-pay

## 2020-06-23 ENCOUNTER — Inpatient Hospital Stay (HOSPITAL_BASED_OUTPATIENT_CLINIC_OR_DEPARTMENT_OTHER): Payer: Medicaid Other | Admitting: Hematology

## 2020-06-23 VITALS — BP 136/77 | HR 69 | Temp 97.0°F | Resp 18 | Wt 178.2 lb

## 2020-06-23 DIAGNOSIS — C61 Malignant neoplasm of prostate: Secondary | ICD-10-CM

## 2020-06-23 DIAGNOSIS — C7951 Secondary malignant neoplasm of bone: Secondary | ICD-10-CM | POA: Diagnosis not present

## 2020-06-23 DIAGNOSIS — D631 Anemia in chronic kidney disease: Secondary | ICD-10-CM | POA: Diagnosis not present

## 2020-06-23 DIAGNOSIS — N189 Chronic kidney disease, unspecified: Secondary | ICD-10-CM | POA: Diagnosis not present

## 2020-06-23 LAB — COMPREHENSIVE METABOLIC PANEL
ALT: 16 U/L (ref 0–44)
AST: 23 U/L (ref 15–41)
Albumin: 3.9 g/dL (ref 3.5–5.0)
Alkaline Phosphatase: 45 U/L (ref 38–126)
Anion gap: 11 (ref 5–15)
BUN: 17 mg/dL (ref 8–23)
CO2: 23 mmol/L (ref 22–32)
Calcium: 8.8 mg/dL — ABNORMAL LOW (ref 8.9–10.3)
Chloride: 104 mmol/L (ref 98–111)
Creatinine, Ser: 1.55 mg/dL — ABNORMAL HIGH (ref 0.61–1.24)
GFR, Estimated: 45 mL/min — ABNORMAL LOW (ref >60)
Glucose, Bld: 112 mg/dL — ABNORMAL HIGH (ref 70–99)
Potassium: 3.5 mmol/L (ref 3.5–5.1)
Sodium: 138 mmol/L (ref 135–145)
Total Bilirubin: 1 mg/dL (ref 0.3–1.2)
Total Protein: 7 g/dL (ref 6.5–8.1)

## 2020-06-23 MED ORDER — DENOSUMAB 120 MG/1.7ML ~~LOC~~ SOLN
120.0000 mg | Freq: Once | SUBCUTANEOUS | Status: AC
Start: 2020-06-23 — End: 2020-06-23
  Administered 2020-06-23: 120 mg via SUBCUTANEOUS
  Filled 2020-06-23: qty 1.7

## 2020-06-23 MED FILL — ABIRATERONE ACETATE 250 MG: 250 | 30 days supply | Qty: 120 | Fill #0

## 2020-06-23 MED FILL — predniSONE 5 MG TABS: 5 | 30 days supply | Qty: 30 | Fill #6
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
## 2020-06-23 NOTE — Patient Instructions (Signed)
[facility] at [facility]  Discharge Instructions:  Denosumab injection What is this medicine? DENOSUMAB (den oh sue mab) slows bone breakdown. Prolia is used to treat osteoporosis in women after menopause and in men, and in people who are taking corticosteroids for 6 months or more. Xgeva is used to treat a high calcium level due to cancer and to prevent bone fractures and other bone problems caused by multiple myeloma or cancer bone metastases. Xgeva is also used to treat giant cell tumor of the bone. This medicine may be used for other purposes; ask your health care provider or pharmacist if you have questions. COMMON BRAND NAME(S): Prolia, XGEVA What should I tell my health care provider before I take this medicine? They need to know if you have any of these conditions:  dental disease  having surgery or tooth extraction  infection  kidney disease  low levels of calcium or Vitamin D in the blood  malnutrition  on hemodialysis  skin conditions or sensitivity  thyroid or parathyroid disease  an unusual reaction to denosumab, other medicines, foods, dyes, or preservatives  pregnant or trying to get pregnant  breast-feeding How should I use this medicine? This medicine is for injection under the skin. It is given by a health care professional in a hospital or clinic setting. A special MedGuide will be given to you before each treatment. Be sure to read this information carefully each time. For Prolia, talk to your pediatrician regarding the use of this medicine in children. Special care may be needed. For Xgeva, talk to your pediatrician regarding the use of this medicine in children. While this drug may be prescribed for children as young as 13 years for selected conditions, precautions do apply. Overdosage: If you think you have taken too much of this medicine contact a poison control center or emergency room at once. NOTE: This medicine is only for  you. Do not share this medicine with others. What if I miss a dose? It is important not to miss your dose. Call your doctor or health care professional if you are unable to keep an appointment. What may interact with this medicine? Do not take this medicine with any of the following medications:  other medicines containing denosumab This medicine may also interact with the following medications:  medicines that lower your chance of fighting infection  steroid medicines like prednisone or cortisone This list may not describe all possible interactions. Give your health care provider a list of all the medicines, herbs, non-prescription drugs, or dietary supplements you use. Also tell them if you smoke, drink alcohol, or use illegal drugs. Some items may interact with your medicine. What should I watch for while using this medicine? Visit your doctor or health care professional for regular checks on your progress. Your doctor or health care professional may order blood tests and other tests to see how you are doing. Call your doctor or health care professional for advice if you get a fever, chills or sore throat, or other symptoms of a cold or flu. Do not treat yourself. This drug may decrease your body's ability to fight infection. Try to avoid being around people who are sick. You should make sure you get enough calcium and vitamin D while you are taking this medicine, unless your doctor tells you not to. Discuss the foods you eat and the vitamins you take with your health care professional. See your dentist regularly. Brush and floss your teeth as directed.   Before you have any dental work done, tell your dentist you are receiving this medicine. Do not become pregnant while taking this medicine or for 5 months after stopping it. Talk with your doctor or health care professional about your birth control options while taking this medicine. Women should inform their doctor if they wish to become  pregnant or think they might be pregnant. There is a potential for serious side effects to an unborn child. Talk to your health care professional or pharmacist for more information. What side effects may I notice from receiving this medicine? Side effects that you should report to your doctor or health care professional as soon as possible:  allergic reactions like skin rash, itching or hives, swelling of the face, lips, or tongue  bone pain  breathing problems  dizziness  jaw pain, especially after dental work  redness, blistering, peeling of the skin  signs and symptoms of infection like fever or chills; cough; sore throat; pain or trouble passing urine  signs of low calcium like fast heartbeat, muscle cramps or muscle pain; pain, tingling, numbness in the hands or feet; seizures  unusual bleeding or bruising  unusually weak or tired Side effects that usually do not require medical attention (report to your doctor or health care professional if they continue or are bothersome):  constipation  diarrhea  headache  joint pain  loss of appetite  muscle pain  runny nose  tiredness  upset stomach This list may not describe all possible side effects. Call your doctor for medical advice about side effects. You may report side effects to FDA at 1-800-FDA-1088. Where should I keep my medicine? This medicine is only given in a clinic, doctor's office, or other health care setting and will not be stored at home. NOTE: This sheet is a summary. It may not cover all possible information. If you have questions about this medicine, talk to your doctor, pharmacist, or health care provider.  2021 Elsevier/Gold Standard (2017-08-16 16:10:44)  _______________________________________________________________  Thank you for choosing [facility] at [facility] to provide your oncology and hematology care.  To afford each patient quality time with our providers,  please arrive at least 15 minutes before your scheduled appointment.  You need to re-schedule your appointment if you arrive 10 or more minutes late.  We strive to give you quality time with our providers, and arriving late affects you and other patients whose appointments are after yours.  Also, if you no show three or more times for appointments you may be dismissed from the clinic.  Again, thank you for choosing [facility] at [facility]. Our hope is that these requests will allow you access to exceptional care and in a timely manner. _______________________________________________________________  If you have questions after your visit, please contact our office at [phone] between the hours of 8:30 a.m. and 5:00 p.m. Voicemails left after 4:30 p.m. will not be returned until the following business day. _______________________________________________________________  For prescription refill requests, have your pharmacy contact our office. _______________________________________________________________  Recommendations made by the consultant and any test results will be sent to your referring physician. _______________________________________________________________ 

## 2020-06-23 NOTE — Progress Notes (Signed)
Patient was assessed by Dr. Katragadda and labs have been reviewed.  Patient is okay to proceed with Xgeva injection today. Primary RN and pharmacy aware.   

## 2020-06-23 NOTE — Progress Notes (Signed)
Carlos Harper presents today for denosumab injection. Lab work reviewed prior to administration. VSS. Pt reports taking Ca and Vit D as instructed. Pt denies tooth/jaw pain and denies recent or future invasive dental work. Injection tolerated well, see MAR for details. Site clean and dry, band aid applied. Pt discharged in satisfactory condition with follow up instructions.

## 2020-06-23 NOTE — Patient Instructions (Signed)
[place] at [facility] Discharge Instructions  You were seen today by Dr. Delton Coombes. He went over your recent results. You received your Xgeva injection today; continue getting your injection every month. Dr. Delton Coombes will see you back in 2 months for labs and follow up.   Thank you for choosing [place] at [facility] to provide your oncology and hematology care.  To afford each patient quality time with our provider, please arrive at least 15 minutes before your scheduled appointment time.   If you have a lab appointment with the [facility] please come in thru the Main Entrance and check in at the main information desk  You need to re-schedule your appointment should you arrive 10 or more minutes late.  We strive to give you quality time with our providers, and arriving late affects you and other patients whose appointments are after yours.  Also, if you no show three or more times for appointments you may be dismissed from the clinic at the providers discretion.     Again, thank you for choosing [facility].  Our hope is that these requests will decrease the amount of time that you wait before being seen by our physicians.       _____________________________________________________________  Should you have questions after your visit to [facility], please contact our office at [phone] between the hours of 8:00 a.m. and 4:30 p.m.  Voicemails left after 4:00 p.m. will not be returned until the following business day.  For prescription refill requests, have your pharmacy contact our office and allow 72 hours.    Cancer Center Support Programs:   > Cancer Support Group  2nd Tuesday of the month 1pm-2pm, Journey Room

## 2020-07-15 ENCOUNTER — Other Ambulatory Visit (HOSPITAL_COMMUNITY): Payer: Self-pay

## 2020-07-20 ENCOUNTER — Other Ambulatory Visit (HOSPITAL_COMMUNITY): Payer: Self-pay | Admitting: Hematology

## 2020-07-20 DIAGNOSIS — C61 Malignant neoplasm of prostate: Secondary | ICD-10-CM

## 2020-07-20 MED FILL — predniSONE 5 MG TABS: 5 | 30 days supply | Qty: 30 | Fill #0

## 2020-07-21 ENCOUNTER — Encounter (HOSPITAL_COMMUNITY): Payer: Self-pay

## 2020-07-21 ENCOUNTER — Inpatient Hospital Stay (HOSPITAL_COMMUNITY): Payer: Medicaid Other

## 2020-07-21 ENCOUNTER — Other Ambulatory Visit: Payer: Self-pay

## 2020-07-21 VITALS — BP 168/74 | HR 61 | Temp 96.8°F | Resp 18

## 2020-07-21 DIAGNOSIS — C61 Malignant neoplasm of prostate: Secondary | ICD-10-CM

## 2020-07-21 LAB — COMPREHENSIVE METABOLIC PANEL
ALT: 16 U/L (ref 0–44)
AST: 20 U/L (ref 15–41)
Albumin: 4.1 g/dL (ref 3.5–5.0)
Alkaline Phosphatase: 48 U/L (ref 38–126)
Anion gap: 8 (ref 5–15)
BUN: 25 mg/dL — ABNORMAL HIGH (ref 8–23)
CO2: 24 mmol/L (ref 22–32)
Calcium: 9.3 mg/dL (ref 8.9–10.3)
Chloride: 108 mmol/L (ref 98–111)
Creatinine, Ser: 1.43 mg/dL — ABNORMAL HIGH (ref 0.61–1.24)
GFR, Estimated: 50 mL/min — ABNORMAL LOW (ref >60)
Glucose, Bld: 95 mg/dL (ref 70–99)
Potassium: 3.6 mmol/L (ref 3.5–5.1)
Sodium: 140 mmol/L (ref 135–145)
Total Bilirubin: 1.1 mg/dL (ref 0.3–1.2)
Total Protein: 7.6 g/dL (ref 6.5–8.1)

## 2020-07-21 MED ORDER — DENOSUMAB 120 MG/1.7ML ~~LOC~~ SOLN
120.0000 mg | Freq: Once | SUBCUTANEOUS | Status: AC
  Administered 2020-07-21: 120 mg via SUBCUTANEOUS
  Filled 2020-07-21: qty 1.7

## 2020-07-21 MED FILL — ABIRATERONE ACETATE 250 MG: 250 | 30 days supply | Qty: 120 | Fill #0

## 2020-07-21 NOTE — Patient Instructions (Signed)
[place] at [facility]  Discharge Instructions:  You received an Xgeva shot today.  _______________________________________________________________  Thank you for choosing [place] at [facility] to provide your oncology and hematology care.  To afford each patient quality time with our providers, please arrive at least 15 minutes before your scheduled appointment.  You need to re-schedule your appointment if you arrive 10 or more minutes late.  We strive to give you quality time with our providers, and arriving late affects you and other patients whose appointments are after yours.  Also, if you no show three or more times for appointments you may be dismissed from the clinic.  Again, thank you for choosing [place] at [place] hope is that these requests will allow you access to exceptional care and in a timely manner. _______________________________________________________________  If you have questions after your visit, please contact our office at [phone] between the hours of 8:30 a.m. and 5:00 p.m. Voicemails left after 4:30 p.m. will not be returned until the following business day. _______________________________________________________________  For prescription refill requests, have your pharmacy contact our office. _______________________________________________________________  Recommendations made by the consultant and any test results will be sent to your referring physician. _______________________________________________________________

## 2020-07-21 NOTE — Progress Notes (Signed)
Patient drinking orange juice for calcium.  Denied tooth, jaw, and leg pain.  No recent or upcoming dental visits.  Labs reviewed.  Patient tolerated injection with no complaints voiced.  See MAR for details.  Patient stable during and after injection.  Site clean and dry with no bruising or swelling noted.  Band aid applied.  Vss with discharge and left ambulatory with no s/s of distress noted.

## 2020-08-17 ENCOUNTER — Other Ambulatory Visit (HOSPITAL_COMMUNITY): Payer: Self-pay

## 2020-08-19 ENCOUNTER — Other Ambulatory Visit (HOSPITAL_COMMUNITY): Payer: Self-pay | Admitting: Hematology and Oncology

## 2020-08-19 ENCOUNTER — Other Ambulatory Visit (HOSPITAL_COMMUNITY): Payer: Self-pay

## 2020-08-19 DIAGNOSIS — C61 Malignant neoplasm of prostate: Secondary | ICD-10-CM

## 2020-08-19 MED FILL — Prednisone Tab 5 MG: ORAL | 30 days supply | Qty: 30 | Fill #0 | Status: AC

## 2020-08-20 ENCOUNTER — Other Ambulatory Visit (HOSPITAL_COMMUNITY): Payer: Self-pay

## 2020-08-22 ENCOUNTER — Inpatient Hospital Stay (HOSPITAL_COMMUNITY): Payer: Medicaid Other | Attending: Hematology

## 2020-08-22 ENCOUNTER — Encounter (HOSPITAL_COMMUNITY): Payer: Self-pay | Admitting: Hematology

## 2020-08-22 ENCOUNTER — Other Ambulatory Visit: Payer: Self-pay

## 2020-08-22 ENCOUNTER — Inpatient Hospital Stay (HOSPITAL_BASED_OUTPATIENT_CLINIC_OR_DEPARTMENT_OTHER): Payer: Medicaid Other | Admitting: Hematology

## 2020-08-22 ENCOUNTER — Inpatient Hospital Stay (HOSPITAL_COMMUNITY): Payer: Medicaid Other

## 2020-08-22 ENCOUNTER — Other Ambulatory Visit (HOSPITAL_COMMUNITY): Payer: Self-pay

## 2020-08-22 VITALS — BP 153/88 | HR 68 | Temp 96.9°F | Resp 18 | Wt 178.0 lb

## 2020-08-22 DIAGNOSIS — M549 Dorsalgia, unspecified: Secondary | ICD-10-CM | POA: Diagnosis not present

## 2020-08-22 DIAGNOSIS — C61 Malignant neoplasm of prostate: Secondary | ICD-10-CM | POA: Insufficient documentation

## 2020-08-22 DIAGNOSIS — Z7952 Long term (current) use of systemic steroids: Secondary | ICD-10-CM | POA: Diagnosis not present

## 2020-08-22 DIAGNOSIS — D631 Anemia in chronic kidney disease: Secondary | ICD-10-CM | POA: Diagnosis not present

## 2020-08-22 DIAGNOSIS — C7951 Secondary malignant neoplasm of bone: Secondary | ICD-10-CM | POA: Insufficient documentation

## 2020-08-22 DIAGNOSIS — N189 Chronic kidney disease, unspecified: Secondary | ICD-10-CM | POA: Diagnosis not present

## 2020-08-22 LAB — COMPREHENSIVE METABOLIC PANEL
ALT: 14 U/L (ref 0–44)
AST: 17 U/L (ref 15–41)
Albumin: 3.8 g/dL (ref 3.5–5.0)
Alkaline Phosphatase: 45 U/L (ref 38–126)
Anion gap: 6 (ref 5–15)
BUN: 21 mg/dL (ref 8–23)
CO2: 26 mmol/L (ref 22–32)
Calcium: 8.9 mg/dL (ref 8.9–10.3)
Chloride: 108 mmol/L (ref 98–111)
Creatinine, Ser: 1.44 mg/dL — ABNORMAL HIGH (ref 0.61–1.24)
GFR, Estimated: 49 mL/min — ABNORMAL LOW (ref >60)
Glucose, Bld: 98 mg/dL (ref 70–99)
Potassium: 3.6 mmol/L (ref 3.5–5.1)
Sodium: 140 mmol/L (ref 135–145)
Total Bilirubin: 1.2 mg/dL (ref 0.3–1.2)
Total Protein: 6.7 g/dL (ref 6.5–8.1)

## 2020-08-22 LAB — CBC WITH DIFFERENTIAL/PLATELET
Abs Immature Granulocytes: 0.01 10*3/uL (ref 0.00–0.07)
Basophils Absolute: 0 10*3/uL (ref 0.0–0.1)
Basophils Relative: 0 %
Eosinophils Absolute: 0 10*3/uL (ref 0.0–0.5)
Eosinophils Relative: 1 %
HCT: 35.2 % — ABNORMAL LOW (ref 39.0–52.0)
Hemoglobin: 11.5 g/dL — ABNORMAL LOW (ref 13.0–17.0)
Immature Granulocytes: 0 %
Lymphocytes Relative: 32 %
Lymphs Abs: 1.6 10*3/uL (ref 0.7–4.0)
MCH: 31.5 pg (ref 26.0–34.0)
MCHC: 32.7 g/dL (ref 30.0–36.0)
MCV: 96.4 fL (ref 80.0–100.0)
Monocytes Absolute: 0.4 10*3/uL (ref 0.1–1.0)
Monocytes Relative: 8 %
Neutro Abs: 3 10*3/uL (ref 1.7–7.7)
Neutrophils Relative %: 59 %
Platelets: 200 10*3/uL (ref 150–400)
RBC: 3.65 MIL/uL — ABNORMAL LOW (ref 4.22–5.81)
RDW: 14.2 % (ref 11.5–15.5)
WBC: 5 10*3/uL (ref 4.0–10.5)
nRBC: 0 % (ref 0.0–0.2)

## 2020-08-22 LAB — PSA: Prostatic Specific Antigen: 0.09 ng/mL (ref 0.00–4.00)

## 2020-08-22 MED ORDER — DENOSUMAB 120 MG/1.7ML ~~LOC~~ SOLN
120.0000 mg | Freq: Once | SUBCUTANEOUS | Status: AC
  Administered 2020-08-22: 120 mg via SUBCUTANEOUS
  Filled 2020-08-22: qty 1.7

## 2020-08-22 MED ORDER — MISC. DEVICES MISC: 0 refills | Status: DC

## 2020-08-22 NOTE — Progress Notes (Signed)
Pt is taking Zytiga as prescribed with no side effects. 

## 2020-08-22 NOTE — Progress Notes (Signed)
Patient was assessed by Dr. Katragadda and labs have been reviewed.  Patient is okay to proceed with Xgeva injection today. Primary RN and pharmacy aware.   

## 2020-08-22 NOTE — Patient Instructions (Signed)
[facility]  Discharge Instructions: Thank you for choosing [facility] to provide your oncology and hematology care.  If you have a lab appointment with the Cancer Center, please come in thru the Main Entrance and check in at the main information desk.  Wear comfortable clothing and clothing appropriate for easy access to any Portacath or PICC line.   We strive to give you quality time with your provider. You may need to reschedule your appointment if you arrive late (15 or more minutes).  Arriving late affects you and other patients whose appointments are after yours.  Also, if you miss three or more appointments without notifying the office, you may be dismissed from the clinic at the provider's discretion.      For prescription refill requests, have your pharmacy contact our office and allow 72 hours for refills to be completed.    Today you received the following chemotherapy and/or immunotherapy agents Xgeva      To help prevent nausea and vomiting after your treatment, we encourage you to take your nausea medication as directed.  BELOW ARE SYMPTOMS THAT SHOULD BE REPORTED IMMEDIATELY: *FEVER GREATER THAN 100.4 F (38 C) OR HIGHER *CHILLS OR SWEATING *NAUSEA AND VOMITING THAT IS NOT CONTROLLED WITH YOUR NAUSEA MEDICATION *UNUSUAL SHORTNESS OF BREATH *UNUSUAL BRUISING OR BLEEDING *URINARY PROBLEMS (pain or burning when urinating, or frequent urination) *BOWEL PROBLEMS (unusual diarrhea, constipation, pain near the anus) TENDERNESS IN MOUTH AND THROAT WITH OR WITHOUT PRESENCE OF ULCERS (sore throat, sores in mouth, or a toothache) UNUSUAL RASH, SWELLING OR PAIN  UNUSUAL VAGINAL DISCHARGE OR ITCHING   Items with * indicate a potential emergency and should be followed up as soon as possible or go to the Emergency Department if any problems should occur.  Please show the CHEMOTHERAPY ALERT CARD or IMMUNOTHERAPY ALERT CARD at check-in to the Emergency Department  and triage nurse.  Should you have questions after your visit or need to cancel or reschedule your appointment, please contact [facility] [phone]  and follow the prompts.  Office hours are 8:00 a.m. to 4:30 p.m. Monday - Friday. Please note that voicemails left after 4:00 p.m. may not be returned until the following business day.  We are closed weekends and major holidays. You have access to a nurse at all times for urgent questions. Please call the main number to the clinic [phone] and follow the prompts.  For any non-urgent questions, you may also contact your provider using MyChart. We now offer e-Visits for anyone 18 and older to request care online for non-urgent symptoms. For details visit [email or web address].   Also download the MyChart app! Go to the app store, search "MyChart", open the app, select [place], and log in with your MyChart username and password.  Due to Covid, a mask is required upon entering the hospital/clinic. If you do not have a mask, one will be given to you upon arrival. For doctor visits, patients may have 1 support person aged 18 or older with them. For treatment visits, patients cannot have anyone with them due to current Covid guidelines and our immunocompromised population.  
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
## 2020-08-22 NOTE — Progress Notes (Signed)
Carlos Harper presents today for office visit and injection per the provider's orders.  Xgeva administration without incident to Left Lower Abdomen; injection site WNL; see MAR for injection details.  Patient remained stable during administration.  No questions or complaints noted at this time.  Patient discharged alert and ambulatory.

## 2020-08-22 NOTE — Patient Instructions (Signed)
[facility] at [facility] Discharge Instructions  You were seen today by Dr. Katragadda. He went over your recent results. You received your Xgeva injection today; continue getting your injection every month. Dr. Katragadda will see you back in 2 months for labs and follow up.   Thank you for choosing [facility] at [facility] to provide your oncology and hematology care.  To afford each patient quality time with our provider, please arrive at least 15 minutes before your scheduled appointment time.   If you have a lab appointment with the Cancer Center please come in thru the Main Entrance and check in at the main information desk  You need to re-schedule your appointment should you arrive 10 or more minutes late.  We strive to give you quality time with our providers, and arriving late affects you and other patients whose appointments are after yours.  Also, if you no show three or more times for appointments you may be dismissed from the clinic at the providers discretion.     Again, thank you for choosing [facility].  Our hope is that these requests will decrease the amount of time that you wait before being seen by our physicians.       _____________________________________________________________  Should you have questions after your visit to [facility], please contact our office at [phone] between the hours of 8:00 a.m. and 4:30 p.m.  Voicemails left after 4:00 p.m. will not be returned until the following business day.  For prescription refill requests, have your pharmacy contact our office and allow 72 hours.    Cancer Center Support Programs:   > Cancer Support Group  2nd Tuesday of the month 1pm-2pm, Journey Room    

## 2020-09-09 ENCOUNTER — Other Ambulatory Visit (HOSPITAL_COMMUNITY): Payer: Self-pay

## 2020-09-14 ENCOUNTER — Other Ambulatory Visit (HOSPITAL_COMMUNITY): Payer: Self-pay

## 2020-09-14 ENCOUNTER — Other Ambulatory Visit (HOSPITAL_COMMUNITY): Payer: Self-pay | Admitting: Hematology

## 2020-09-14 DIAGNOSIS — C61 Malignant neoplasm of prostate: Secondary | ICD-10-CM

## 2020-09-14 MED ORDER — ABIRATERONE ACETATE 250 MG PO TABS
ORAL_TABLET | Freq: Every day | ORAL | 0 refills | Status: DC
  Filled 2020-09-14: qty 120, 30d supply, fill #0

## 2020-09-15 ENCOUNTER — Other Ambulatory Visit (HOSPITAL_COMMUNITY): Payer: Self-pay

## 2020-09-16 ENCOUNTER — Other Ambulatory Visit (HOSPITAL_COMMUNITY): Payer: Self-pay | Admitting: *Deleted

## 2020-09-16 DIAGNOSIS — C61 Malignant neoplasm of prostate: Secondary | ICD-10-CM

## 2020-09-20 ENCOUNTER — Inpatient Hospital Stay (HOSPITAL_COMMUNITY): Payer: Medicaid Other

## 2020-09-20 ENCOUNTER — Other Ambulatory Visit: Payer: Self-pay

## 2020-09-20 ENCOUNTER — Other Ambulatory Visit (HOSPITAL_COMMUNITY): Payer: Self-pay

## 2020-09-20 VITALS — BP 155/88 | HR 73 | Temp 96.9°F | Resp 18

## 2020-09-20 DIAGNOSIS — C61 Malignant neoplasm of prostate: Secondary | ICD-10-CM | POA: Diagnosis not present

## 2020-09-20 LAB — CBC WITH DIFFERENTIAL/PLATELET
Abs Immature Granulocytes: 0.01 10*3/uL (ref 0.00–0.07)
Basophils Absolute: 0 10*3/uL (ref 0.0–0.1)
Basophils Relative: 0 %
Eosinophils Absolute: 0 10*3/uL (ref 0.0–0.5)
Eosinophils Relative: 0 %
HCT: 35.5 % — ABNORMAL LOW (ref 39.0–52.0)
Hemoglobin: 11.8 g/dL — ABNORMAL LOW (ref 13.0–17.0)
Immature Granulocytes: 0 %
Lymphocytes Relative: 35 %
Lymphs Abs: 1.6 10*3/uL (ref 0.7–4.0)
MCH: 32 pg (ref 26.0–34.0)
MCHC: 33.2 g/dL (ref 30.0–36.0)
MCV: 96.2 fL (ref 80.0–100.0)
Monocytes Absolute: 0.3 10*3/uL (ref 0.1–1.0)
Monocytes Relative: 7 %
Neutro Abs: 2.6 10*3/uL (ref 1.7–7.7)
Neutrophils Relative %: 58 %
Platelets: 196 10*3/uL (ref 150–400)
RBC: 3.69 MIL/uL — ABNORMAL LOW (ref 4.22–5.81)
RDW: 13.4 % (ref 11.5–15.5)
WBC: 4.5 10*3/uL (ref 4.0–10.5)
nRBC: 0 % (ref 0.0–0.2)

## 2020-09-20 LAB — COMPREHENSIVE METABOLIC PANEL
ALT: 16 U/L (ref 0–44)
AST: 18 U/L (ref 15–41)
Albumin: 3.8 g/dL (ref 3.5–5.0)
Alkaline Phosphatase: 49 U/L (ref 38–126)
Anion gap: 6 (ref 5–15)
BUN: 16 mg/dL (ref 8–23)
CO2: 26 mmol/L (ref 22–32)
Calcium: 8.8 mg/dL — ABNORMAL LOW (ref 8.9–10.3)
Chloride: 106 mmol/L (ref 98–111)
Creatinine, Ser: 1.42 mg/dL — ABNORMAL HIGH (ref 0.61–1.24)
GFR, Estimated: 50 mL/min — ABNORMAL LOW (ref >60)
Glucose, Bld: 97 mg/dL (ref 70–99)
Potassium: 3.7 mmol/L (ref 3.5–5.1)
Sodium: 138 mmol/L (ref 135–145)
Total Bilirubin: 1.4 mg/dL — ABNORMAL HIGH (ref 0.3–1.2)
Total Protein: 6.8 g/dL (ref 6.5–8.1)

## 2020-09-20 LAB — PSA: Prostatic Specific Antigen: 0.12 ng/mL (ref 0.00–4.00)

## 2020-09-20 MED ORDER — DENOSUMAB 120 MG/1.7ML ~~LOC~~ SOLN
120.0000 mg | Freq: Once | SUBCUTANEOUS | Status: AC
  Administered 2020-09-20: 120 mg via SUBCUTANEOUS

## 2020-09-20 MED ORDER — DENOSUMAB 120 MG/1.7ML ~~LOC~~ SOLN
SUBCUTANEOUS | Status: AC
  Filled 2020-09-20: qty 1.7

## 2020-09-20 NOTE — Progress Notes (Signed)
Patient tolerated Xgeva injection with no complaints voiced.  Site clean and dry with no bruising or swelling noted.  No complaints of pain.  Discharged with vital signs stable and no signs or symptoms of distress noted.  

## 2020-10-17 ENCOUNTER — Other Ambulatory Visit (HOSPITAL_COMMUNITY): Payer: Self-pay

## 2020-10-17 ENCOUNTER — Other Ambulatory Visit (HOSPITAL_COMMUNITY): Payer: Self-pay | Admitting: Hematology

## 2020-10-17 DIAGNOSIS — C61 Malignant neoplasm of prostate: Secondary | ICD-10-CM

## 2020-10-17 MED ORDER — ABIRATERONE ACETATE 250 MG PO TABS
ORAL_TABLET | Freq: Every day | ORAL | 0 refills | Status: DC
  Filled 2020-10-17: qty 120, 30d supply, fill #0

## 2020-10-17 MED FILL — Prednisone Tab 5 MG: ORAL | 30 days supply | Qty: 30 | Fill #1 | Status: AC

## 2020-10-18 ENCOUNTER — Inpatient Hospital Stay (HOSPITAL_BASED_OUTPATIENT_CLINIC_OR_DEPARTMENT_OTHER): Payer: Medicaid Other | Admitting: Hematology and Oncology

## 2020-10-18 ENCOUNTER — Other Ambulatory Visit: Payer: Self-pay

## 2020-10-18 ENCOUNTER — Inpatient Hospital Stay (HOSPITAL_COMMUNITY): Payer: Medicaid Other

## 2020-10-18 ENCOUNTER — Other Ambulatory Visit (HOSPITAL_COMMUNITY): Payer: Self-pay

## 2020-10-18 ENCOUNTER — Inpatient Hospital Stay (HOSPITAL_COMMUNITY): Payer: Medicaid Other | Attending: Hematology

## 2020-10-18 VITALS — BP 137/85 | HR 70 | Temp 97.0°F | Resp 17 | Wt 177.0 lb

## 2020-10-18 DIAGNOSIS — D631 Anemia in chronic kidney disease: Secondary | ICD-10-CM | POA: Insufficient documentation

## 2020-10-18 DIAGNOSIS — C7951 Secondary malignant neoplasm of bone: Secondary | ICD-10-CM | POA: Diagnosis not present

## 2020-10-18 DIAGNOSIS — C61 Malignant neoplasm of prostate: Secondary | ICD-10-CM

## 2020-10-18 DIAGNOSIS — I129 Hypertensive chronic kidney disease with stage 1 through stage 4 chronic kidney disease, or unspecified chronic kidney disease: Secondary | ICD-10-CM | POA: Diagnosis not present

## 2020-10-18 DIAGNOSIS — N189 Chronic kidney disease, unspecified: Secondary | ICD-10-CM | POA: Diagnosis not present

## 2020-10-18 LAB — COMPREHENSIVE METABOLIC PANEL
ALT: 17 U/L (ref 0–44)
AST: 19 U/L (ref 15–41)
Albumin: 4.1 g/dL (ref 3.5–5.0)
Alkaline Phosphatase: 51 U/L (ref 38–126)
Anion gap: 8 (ref 5–15)
BUN: 16 mg/dL (ref 8–23)
CO2: 24 mmol/L (ref 22–32)
Calcium: 8.8 mg/dL — ABNORMAL LOW (ref 8.9–10.3)
Chloride: 108 mmol/L (ref 98–111)
Creatinine, Ser: 1.41 mg/dL — ABNORMAL HIGH (ref 0.61–1.24)
GFR, Estimated: 50 mL/min — ABNORMAL LOW (ref >60)
Glucose, Bld: 98 mg/dL (ref 70–99)
Potassium: 3.5 mmol/L (ref 3.5–5.1)
Sodium: 140 mmol/L (ref 135–145)
Total Bilirubin: 1.1 mg/dL (ref 0.3–1.2)
Total Protein: 6.9 g/dL (ref 6.5–8.1)

## 2020-10-18 LAB — CBC WITH DIFFERENTIAL/PLATELET
Abs Immature Granulocytes: 0.03 10*3/uL (ref 0.00–0.07)
Basophils Absolute: 0 10*3/uL (ref 0.0–0.1)
Basophils Relative: 0 %
Eosinophils Absolute: 0 10*3/uL (ref 0.0–0.5)
Eosinophils Relative: 1 %
HCT: 37.5 % — ABNORMAL LOW (ref 39.0–52.0)
Hemoglobin: 12.3 g/dL — ABNORMAL LOW (ref 13.0–17.0)
Immature Granulocytes: 1 %
Lymphocytes Relative: 31 %
Lymphs Abs: 1.7 10*3/uL (ref 0.7–4.0)
MCH: 31.7 pg (ref 26.0–34.0)
MCHC: 32.8 g/dL (ref 30.0–36.0)
MCV: 96.6 fL (ref 80.0–100.0)
Monocytes Absolute: 0.3 10*3/uL (ref 0.1–1.0)
Monocytes Relative: 5 %
Neutro Abs: 3.4 10*3/uL (ref 1.7–7.7)
Neutrophils Relative %: 62 %
Platelets: 144 10*3/uL — ABNORMAL LOW (ref 150–400)
RBC: 3.88 MIL/uL — ABNORMAL LOW (ref 4.22–5.81)
RDW: 13.4 % (ref 11.5–15.5)
WBC: 5.6 10*3/uL (ref 4.0–10.5)
nRBC: 0 % (ref 0.0–0.2)

## 2020-10-18 LAB — PSA: Prostatic Specific Antigen: 0.12 ng/mL (ref 0.00–4.00)

## 2020-10-18 MED ORDER — DENOSUMAB 120 MG/1.7ML ~~LOC~~ SOLN
SUBCUTANEOUS | Status: AC
  Filled 2020-10-18: qty 1.7

## 2020-10-18 MED ORDER — DENOSUMAB 120 MG/1.7ML ~~LOC~~ SOLN
120.0000 mg | Freq: Once | SUBCUTANEOUS | Status: AC
Start: 2020-10-18 — End: 2020-10-18
  Administered 2020-10-18: 120 mg via SUBCUTANEOUS

## 2020-10-18 NOTE — Progress Notes (Signed)
Patient presents today for Xgeva injection per MD order.  Calcium noted to be 8.8.  Patient states that he has no had any jaw pain or dental work.  Patient also states that he is no currently taking calcium supplements because it makes him constipated.  Explained the importance of the supplements. Patient expressed understanding.  Stable during Xgeva administration without incident; injection site WNL; see MAR for injection details.  Patient tolerated procedure well and without incident.  No questions or complaints noted at this time.

## 2020-10-18 NOTE — Patient Instructions (Signed)
[facility]  Discharge Instructions: Thank you for choosing [facility] to provide your oncology and hematology care.  If you have a lab appointment with the Cancer Center, please come in thru the Main Entrance and check in at the main information desk.  Wear comfortable clothing and clothing appropriate for easy access to any Portacath or PICC line.   We strive to give you quality time with your provider. You may need to reschedule your appointment if you arrive late (15 or more minutes).  Arriving late affects you and other patients whose appointments are after yours.  Also, if you miss three or more appointments without notifying the office, you may be dismissed from the clinic at the provider's discretion.      For prescription refill requests, have your pharmacy contact our office and allow 72 hours for refills to be completed.    Today you received the following chemotherapy and/or immunotherapy agents Xgeva      To help prevent nausea and vomiting after your treatment, we encourage you to take your nausea medication as directed.  BELOW ARE SYMPTOMS THAT SHOULD BE REPORTED IMMEDIATELY: *FEVER GREATER THAN 100.4 F (38 C) OR HIGHER *CHILLS OR SWEATING *NAUSEA AND VOMITING THAT IS NOT CONTROLLED WITH YOUR NAUSEA MEDICATION *UNUSUAL SHORTNESS OF BREATH *UNUSUAL BRUISING OR BLEEDING *URINARY PROBLEMS (pain or burning when urinating, or frequent urination) *BOWEL PROBLEMS (unusual diarrhea, constipation, pain near the anus) TENDERNESS IN MOUTH AND THROAT WITH OR WITHOUT PRESENCE OF ULCERS (sore throat, sores in mouth, or a toothache) UNUSUAL RASH, SWELLING OR PAIN  UNUSUAL VAGINAL DISCHARGE OR ITCHING   Items with * indicate a potential emergency and should be followed up as soon as possible or go to the Emergency Department if any problems should occur.  Please show the CHEMOTHERAPY ALERT CARD or IMMUNOTHERAPY ALERT CARD at check-in to the Emergency Department  and triage nurse.  Should you have questions after your visit or need to cancel or reschedule your appointment, please contact [facility] [phone]  and follow the prompts.  Office hours are 8:00 a.m. to 4:30 p.m. Monday - Friday. Please note that voicemails left after 4:00 p.m. may not be returned until the following business day.  We are closed weekends and major holidays. You have access to a nurse at all times for urgent questions. Please call the main number to the clinic [phone] and follow the prompts.  For any non-urgent questions, you may also contact your provider using MyChart. We now offer e-Visits for anyone 18 and older to request care online for non-urgent symptoms. For details visit [email or web address].   Also download the MyChart app! Go to the app store, search "MyChart", open the app, select [facility], and log in with your MyChart username and password.  Due to Covid, a mask is required upon entering the hospital/clinic. If you do not have a mask, one will be given to you upon arrival. For doctor visits, patients may have 1 support person aged 18 or older with them. For treatment visits, patients cannot have anyone with them due to current Covid guidelines and our immunocompromised population.  

## 2020-10-18 NOTE — Progress Notes (Signed)
Carlos Harper, [place] [address]   CLINIC:  Medical Oncology/Hematology  PCP:  Carlos Harper., PA-C [place] / [place] [address] [phone]   REASON FOR VISIT:  Follow-up for metastatic prostate cancer  PRIOR THERAPY:  1. Bilateral orchiectomy on 09/17/2017. 2. Docetaxel x 6 cycles from 11/19/2017 to 03/05/2018.  NGS Results: Not done  CURRENT THERAPY: Zytiga 1,000 mg daily; Xgeva monthly  BRIEF ONCOLOGIC HISTORY:  Oncology History  Malignant neoplasm of prostate ([place])  09/27/2017 Initial Diagnosis   Malignant neoplasm of prostate ([place])    11/19/2017 - 03/07/2018 Chemotherapy   The patient had pegfilgrastim (NEULASTA) injection 6 mg, 6 mg, Subcutaneous, Once, 6 of 6 cycles Administration: 6 mg (11/21/2017), 6 mg (12/12/2017), 6 mg (01/02/2018), 6 mg (01/24/2018), 6 mg (02/14/2018), 6 mg (03/07/2018) pegfilgrastim (NEULASTA ONPRO KIT) injection 6 mg, 6 mg, Subcutaneous, Once, 2 of 2 cycles DOCEtaxel (TAXOTERE) 140 mg in sodium chloride 0.9 % 250 mL chemo infusion, 75 mg/m2 = 140 mg, Intravenous,  Once, 6 of 6 cycles Administration: 140 mg (11/19/2017), 140 mg (12/10/2017), 140 mg (01/22/2018), 140 mg (12/31/2017), 140 mg (02/12/2018), 140 mg (03/05/2018) ondansetron (ZOFRAN) 8 mg, dexamethasone (DECADRON) 10 mg in sodium chloride 0.9 % 50 mL IVPB, , Intravenous,  Once, 4 of 4 cycles Administration:  (12/31/2017),  (01/22/2018),  (02/12/2018),  (03/05/2018)   for chemotherapy treatment.       CANCER STAGING: Cancer Staging No matching staging information was found for the patient.  INTERVAL HISTORY:  [name], a 80 y.o. male, returns for routine follow-up of his metastatic prostate cancer. Carlos Harper was last seen on 08/22/2020.   On exam today Carlos Harper reports his been well in the interim since her last visit.  He has been compliant with his abiraterone and prednisone without any difficulty.  He notes that he does  occasionally have some hot flashes and sweats.  He notes that he has not been having any bone pain or back pain.  He notes that his energy is quite good and he is eating well.  He is taking Zytiga 1,000 mg before breakfast and prednisone 5 mg with breakfast. He denies having any jaw pain.   REVIEW OF SYSTEMS:  Review of Systems  Constitutional:  Positive for fatigue (25%). Negative for appetite change.  Respiratory:  Positive for shortness of breath (w/ exertion).   Cardiovascular:  Positive for leg swelling (L leg).  Musculoskeletal:  Positive for back pain (2/10 lower back pain, occasionally radiating).  Neurological:  Positive for dizziness (occasional).  All other systems reviewed and are negative.  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 ([place]) 09/27/2017   Sickle cell trait ([place])    Vertigo    Past Surgical History:  Procedure Laterality Date   CYSTOSCOPY WITH FULGERATION  09/17/2017   Procedure: CYSTOSCOPY WITH FULGERATION OF BLADDER NECK;  Surgeon: [name], MD;  Location: [facility];  Service: Urology;;   CYSTOSCOPY WITH STENT PLACEMENT Right 09/17/2017   Procedure: CYSTOSCOPY WITH RIGHT RETROGRADE PYELOGRAM ATTEMPTED STENT PLACEMENT;  Surgeon: [name], MD;  Location: [facility];  Service: Urology;  Laterality: Right;   left leg surgery due to [place] Bilateral 09/17/2017   Procedure: ORCHIECTOMY;  Surgeon: [name], MD;  Location: [facility];  Service: Urology;  Laterality: Bilateral;  PORTACATH PLACEMENT Left 10/14/2017   Procedure: INSERTION PORT-A-CATH;  Surgeon: Aviva Signs, MD;  Location: [facility];  Service: General;  Laterality: Left;   PROSTATE BIOPSY N/A 09/17/2017   Procedure: BIOPSY TRANSRECTAL ULTRASONIC PROSTATE (TUBP);  Surgeon: [name], MD;  Location: [facility];  Service: Urology;  Laterality: N/A;    SOCIAL HISTORY:  Social History    Socioeconomic History   Marital status: Single    Spouse name: Not on file   Number of children: Not on file   Years of education: Not on file   Highest education level: Not on file  Occupational History   Not on file  Tobacco Use   Smoking status: Never   Smokeless tobacco: Never  Vaping Use   Vaping Use: Never used  Substance and Sexual Activity   Alcohol use: Yes    Comment: Drinks beer occasionally    Drug use: No   Sexual activity: Not Currently  Other Topics Concern   Not on file  Social History Narrative   Not on file   Social Determinants of Health   Financial Resource Strain: Low Risk    Difficulty of Paying Living Expenses: Not hard at all  Food Insecurity: No Food Insecurity   Worried About Charity fundraiser in the Last Year: Never true   [place] in the Last Year: Never true  Transportation Needs: No Transportation Needs   Lack of Transportation (Medical): No   Lack of Transportation (Non-Medical): No  Physical Activity: Inactive   Days of Exercise per Week: 0 days   Minutes of Exercise per Session: 0 min  Stress: No Stress Concern Present   Feeling of Stress : Not at all  Social Connections: Moderately Isolated   Frequency of Communication with Friends and Family: More than three times a week   Frequency of Social Gatherings with Friends and Family: More than three times a week   Attends Religious Services: More than 4 times per year   Active Member of [employer] or Organizations: No   Attends Music therapist: Never   Marital Status: Divorced  Human resources officer Violence: Not At Risk   Fear of Current or Ex-Partner: No   Emotionally Abused: No   Physically Abused: No   Sexually Abused: No    FAMILY HISTORY:  Family History  Problem Relation Age of Onset   Hypertension Mother    Lung cancer Father    Lung cancer Brother    HIV/AIDS Brother    Breast cancer Daughter     CURRENT MEDICATIONS:  Current Outpatient Medications   Medication Sig Dispense Refill   abiraterone acetate (ZYTIGA) 250 MG tablet TAKE 4 TABLETS (1,000 MG TOTAL) BY MOUTH DAILY. TAKE ON AN EMPTY STOMACH 1 HOUR BEFORE OR 2 HOURS AFTER A MEAL 120 tablet 0   abiraterone acetate (ZYTIGA) 250 MG tablet TAKE 4 TABLETS (1,000 MG TOTAL) BY MOUTH DAILY. TAKE ON AN EMPTY STOMACH 1 HOUR BEFORE OR 2 HOURS AFTER A MEAL 120 tablet 0   docusate sodium (COLACE) 100 MG capsule Take 100 mg by mouth daily.     dronabinol (MARINOL) 5 MG capsule Take 1 capsule (5 mg total) by mouth 2 (two) times daily before a meal. 60 capsule 1   Misc. Devices MISC Please provide patient with a rollaider that has a seat. 1 each 0   predniSONE (DELTASONE) 5 MG tablet TAKE 1 TABLET BY MOUTH ONCE A DAY WITH BREAKFAST 30 tablet 6   No  current facility-administered medications for this visit.    ALLERGIES:  No Known Allergies  PHYSICAL EXAM:  Performance status (ECOG): 1 - Symptomatic but completely ambulatory  Vitals:   10/18/20 1051  BP: 137/85  Pulse: 70  Resp: 17  Temp: (!) 97 F (36.1 C)  SpO2: 100%   Wt Readings from Last 3 Encounters:  10/18/20 177 lb (80.3 kg)  08/22/20 178 lb (80.7 kg)  06/23/20 178 lb 3.2 oz (80.8 kg)   Physical Exam Vitals reviewed.  Constitutional:      Appearance: Normal appearance.  Cardiovascular:     Rate and Rhythm: Normal rate and regular rhythm.     Pulses: Normal pulses.     Heart sounds: Normal heart sounds.  Pulmonary:     Effort: Pulmonary effort is normal.     Breath sounds: Normal breath sounds.  Musculoskeletal:     Right lower leg: No edema.     Left lower leg: No edema.  Neurological:     General: No focal deficit present.     Mental Status: He is alert and oriented to person, place, and time.  Psychiatric:        Mood and Affect: Mood normal.        Behavior: Behavior normal.     LABORATORY DATA:  I have reviewed the labs as listed.  CBC Latest Ref Rng & Units 10/18/2020 09/20/2020 08/22/2020  WBC 4.0 - 10.5  K/uL 5.6 4.5 5.0  Hemoglobin 13.0 - 17.0 g/dL 12.3(L) 11.8(L) 11.5(L)  Hematocrit 39.0 - 52.0 % 37.5(L) 35.5(L) 35.2(L)  Platelets 150 - 400 K/uL 144(L) 196 200   CMP Latest Ref Rng & Units 10/18/2020 09/20/2020 08/22/2020  Glucose 70 - 99 mg/dL 98 97 98  BUN 8 - 23 mg/dL _0 Creatinine 0.61 - 1.24 mg/dL 1.41(H) 1.42(H) 1.44(H)  Sodium 135 - 145 mmol/L 140 138 140  Potassium 3.5 - 5.1 mmol/L 3.5 3.7 3.6  Chloride 98 - 111 mmol/L 108 106 108  CO2 22 - 32 mmol/L _1 Calcium 8.9 - 10.3 mg/dL 8.8(L) 8.8(L) 8.9  Total Protein 6.5 - 8.1 g/dL 6.9 6.8 6.7  Total Bilirubin 0.3 - 1.2 mg/dL 1.1 1.4(H) 1.2  Alkaline Phos 38 - 126 U/L 51 49 45  AST 15 - 41 U/L _2 ALT 0 - 44 U/L _3 PSA 0.05 03/03/2020  PSA 0.17 03/31/2020  PSA 0.17 05/02/2020    DIAGNOSTIC IMAGING:  I have independently reviewed the scans and discussed with the patient. No results found.   ASSESSMENT:  1.  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 -6 cycles of docetaxel from 11/19/2017 through 03/05/2018. -Abiraterone and prednisone started on 05/07/2018.  PSA was 4.52.   2.  Bone metastasis: -Denosumab started on 07/21/2018.   3.  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  Last Feraheme on 01/22/2018.   4.  CKD: -Baseline creatinine between 1.5-1.6.   PLAN:  1.  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 - CTAP on 06/09/2020 with no nodal or visceral metastasis. - Continue Abiraterone 1000 mg daily along with prednisone 5 mg daily. - His PSA continued to improve to 0.12 at last visit on 09/20/2020.  LFTs are normal. - RTC 2 months for follow-up.   2.  Bone metastasis: -His calcium  is normal today.  He cannot tolerate calcium pills due to constipation.  He drinks orange  juice with calcium in it. - Continue monthly denosumab.   3.  Normocytic anemia: -Combination from a CKD and relative iron deficiency.  Hemoglobin today is 12.3   4.  Back pain: -He has reported on and off back pain if he stands for too long. - Pain improves after resting.   5.  CKD: - Creatinine today is back to baseline at 1.41.  Continue aggressive hydration.   Orders placed this encounter:  No orders of the defined types were placed in this encounter.  Ledell Peoples, MD Department of Hematology/Oncology [facility] Phone: [phone] Pager: [phone] Email: [email or web address]

## 2020-10-19 ENCOUNTER — Other Ambulatory Visit (HOSPITAL_COMMUNITY): Payer: Self-pay

## 2020-10-21 ENCOUNTER — Other Ambulatory Visit (HOSPITAL_COMMUNITY): Payer: Self-pay

## 2020-10-21 DIAGNOSIS — C61 Malignant neoplasm of prostate: Secondary | ICD-10-CM

## 2020-11-15 ENCOUNTER — Inpatient Hospital Stay (HOSPITAL_COMMUNITY): Payer: Medicaid Other

## 2020-11-15 ENCOUNTER — Inpatient Hospital Stay (HOSPITAL_COMMUNITY): Payer: Medicaid Other | Attending: Hematology

## 2020-11-15 DIAGNOSIS — C61 Malignant neoplasm of prostate: Secondary | ICD-10-CM | POA: Insufficient documentation

## 2020-11-15 DIAGNOSIS — D631 Anemia in chronic kidney disease: Secondary | ICD-10-CM | POA: Insufficient documentation

## 2020-11-15 DIAGNOSIS — N189 Chronic kidney disease, unspecified: Secondary | ICD-10-CM | POA: Insufficient documentation

## 2020-11-15 DIAGNOSIS — C7951 Secondary malignant neoplasm of bone: Secondary | ICD-10-CM | POA: Insufficient documentation

## 2020-11-16 ENCOUNTER — Other Ambulatory Visit (HOSPITAL_COMMUNITY): Payer: Self-pay

## 2020-11-17 ENCOUNTER — Inpatient Hospital Stay (HOSPITAL_COMMUNITY): Payer: Medicaid Other

## 2020-11-17 ENCOUNTER — Encounter (HOSPITAL_COMMUNITY): Payer: Self-pay

## 2020-11-17 ENCOUNTER — Other Ambulatory Visit: Payer: Self-pay

## 2020-11-17 VITALS — BP 157/78 | HR 68 | Temp 96.7°F | Resp 18

## 2020-11-17 DIAGNOSIS — C61 Malignant neoplasm of prostate: Secondary | ICD-10-CM

## 2020-11-17 DIAGNOSIS — C7951 Secondary malignant neoplasm of bone: Secondary | ICD-10-CM | POA: Diagnosis not present

## 2020-11-17 DIAGNOSIS — N189 Chronic kidney disease, unspecified: Secondary | ICD-10-CM | POA: Diagnosis not present

## 2020-11-17 DIAGNOSIS — D631 Anemia in chronic kidney disease: Secondary | ICD-10-CM | POA: Diagnosis not present

## 2020-11-17 LAB — CBC WITH DIFFERENTIAL/PLATELET
Abs Immature Granulocytes: 0.01 10*3/uL (ref 0.00–0.07)
Basophils Absolute: 0 10*3/uL (ref 0.0–0.1)
Basophils Relative: 0 %
Eosinophils Absolute: 0 10*3/uL (ref 0.0–0.5)
Eosinophils Relative: 0 %
HCT: 35.6 % — ABNORMAL LOW (ref 39.0–52.0)
Hemoglobin: 12.2 g/dL — ABNORMAL LOW (ref 13.0–17.0)
Immature Granulocytes: 0 %
Lymphocytes Relative: 29 %
Lymphs Abs: 1.6 10*3/uL (ref 0.7–4.0)
MCH: 32.8 pg (ref 26.0–34.0)
MCHC: 34.3 g/dL (ref 30.0–36.0)
MCV: 95.7 fL (ref 80.0–100.0)
Monocytes Absolute: 0.3 10*3/uL (ref 0.1–1.0)
Monocytes Relative: 5 %
Neutro Abs: 3.7 10*3/uL (ref 1.7–7.7)
Neutrophils Relative %: 66 %
Platelets: 246 10*3/uL (ref 150–400)
RBC: 3.72 MIL/uL — ABNORMAL LOW (ref 4.22–5.81)
RDW: 13.7 % (ref 11.5–15.5)
WBC: 5.6 10*3/uL (ref 4.0–10.5)
nRBC: 0 % (ref 0.0–0.2)

## 2020-11-17 LAB — COMPREHENSIVE METABOLIC PANEL
ALT: 14 U/L (ref 0–44)
AST: 14 U/L — ABNORMAL LOW (ref 15–41)
Albumin: 3.9 g/dL (ref 3.5–5.0)
Alkaline Phosphatase: 49 U/L (ref 38–126)
Anion gap: 7 (ref 5–15)
BUN: 25 mg/dL — ABNORMAL HIGH (ref 8–23)
CO2: 25 mmol/L (ref 22–32)
Calcium: 9 mg/dL (ref 8.9–10.3)
Chloride: 105 mmol/L (ref 98–111)
Creatinine, Ser: 1.53 mg/dL — ABNORMAL HIGH (ref 0.61–1.24)
GFR, Estimated: 46 mL/min — ABNORMAL LOW (ref >60)
Glucose, Bld: 121 mg/dL — ABNORMAL HIGH (ref 70–99)
Potassium: 4.2 mmol/L (ref 3.5–5.1)
Sodium: 137 mmol/L (ref 135–145)
Total Bilirubin: 0.9 mg/dL (ref 0.3–1.2)
Total Protein: 6.8 g/dL (ref 6.5–8.1)

## 2020-11-17 LAB — PSA: Prostatic Specific Antigen: 0.15 ng/mL (ref 0.00–4.00)

## 2020-11-17 MED ORDER — DENOSUMAB 120 MG/1.7ML ~~LOC~~ SOLN
120.0000 mg | Freq: Once | SUBCUTANEOUS | Status: AC
  Administered 2020-11-17: 120 mg via SUBCUTANEOUS
  Filled 2020-11-17: qty 1.7

## 2020-11-17 NOTE — Patient Instructions (Signed)
[facility]  Discharge Instructions: Thank you for choosing [facility] to provide your oncology and hematology care.  If you have a lab appointment with the [facility], please come in thru the Main Entrance and check in at the main information desk.  Wear comfortable clothing and clothing appropriate for easy access to any Portacath or PICC line.   We strive to give you quality time with your provider. You may need to reschedule your appointment if you arrive late (15 or more minutes).  Arriving late affects you and other patients whose appointments are after yours.  Also, if you miss three or more appointments without notifying the office, you may be dismissed from the clinic at the provider's discretion.      For prescription refill requests, have your pharmacy contact our office and allow 72 hours for refills to be completed.    Today you received the following-Xgeva shot.    To help prevent nausea and vomiting after your treatment, we encourage you to take your nausea medication as directed.  BELOW ARE SYMPTOMS THAT SHOULD BE REPORTED IMMEDIATELY: *FEVER GREATER THAN 100.4 F (38 C) OR HIGHER *CHILLS OR SWEATING *NAUSEA AND VOMITING THAT IS NOT CONTROLLED WITH YOUR NAUSEA MEDICATION *UNUSUAL SHORTNESS OF BREATH *UNUSUAL BRUISING OR BLEEDING *URINARY PROBLEMS (pain or burning when urinating, or frequent urination) *BOWEL PROBLEMS (unusual diarrhea, constipation, pain near the anus) TENDERNESS IN MOUTH AND THROAT WITH OR WITHOUT PRESENCE OF ULCERS (sore throat, sores in mouth, or a toothache) UNUSUAL RASH, SWELLING OR PAIN  UNUSUAL VAGINAL DISCHARGE OR ITCHING   Items with * indicate a potential emergency and should be followed up as soon as possible or go to the Emergency Department if any problems should occur.  Please show the CHEMOTHERAPY ALERT CARD or IMMUNOTHERAPY ALERT CARD at check-in to the Emergency Department and triage nurse.  Should you have  questions after your visit or need to cancel or reschedule your appointment, please contact [facility] [phone]  and follow the prompts.  Office hours are 8:00 a.m. to 4:30 p.m. Monday - Friday. Please note that voicemails left after 4:00 p.m. may not be returned until the following business day.  We are closed weekends and major holidays. You have access to a nurse at all times for urgent questions. Please call the main number to the clinic [phone] and follow the prompts.  For any non-urgent questions, you may also contact your provider using MyChart. We now offer e-Visits for anyone 30 and older to request care online for non-urgent symptoms. For details visit [email or web address].   Also download the MyChart app! Go to the app store, search "MyChart", open the app, select [facility], and log in with your MyChart username and password.  Due to Covid, a mask is required upon entering the hospital/clinic. If you do not have a mask, one will be given to you upon arrival. For doctor visits, patients may have 1 support person aged 74 or older with them. For treatment visits, patients cannot have anyone with them due to current Covid guidelines and our immunocompromised population.

## 2020-11-17 NOTE — Progress Notes (Signed)
Patient taking a calcium tab weekly with orange juice.  Calcium tabs daily cause constipation per patient.   Denied tooth, jaw, and leg pain.  No recent or upcoming dental visits.  Labs reviewed.  Patient tolerated injection with no complaints voiced.  See MAR for details.  Patient stable during and after injection.  Site clean and dry with no bruising or swelling noted.  Band aid applied.  Vss with discharge and left in satisfactory condition with no s/s of distress noted.

## 2020-11-18 ENCOUNTER — Other Ambulatory Visit (HOSPITAL_COMMUNITY): Payer: Self-pay

## 2020-11-21 ENCOUNTER — Other Ambulatory Visit (HOSPITAL_COMMUNITY): Payer: Self-pay | Admitting: Physician Assistant

## 2020-11-21 ENCOUNTER — Other Ambulatory Visit (HOSPITAL_COMMUNITY): Payer: Self-pay

## 2020-11-21 DIAGNOSIS — C61 Malignant neoplasm of prostate: Secondary | ICD-10-CM

## 2020-11-21 MED ORDER — ABIRATERONE ACETATE 250 MG PO TABS
ORAL_TABLET | Freq: Every day | ORAL | 0 refills | Status: DC
  Filled 2020-11-21: qty 120, 30d supply, fill #0

## 2020-11-21 MED FILL — Prednisone Tab 5 MG: ORAL | 30 days supply | Qty: 30 | Fill #2 | Status: AC

## 2020-11-22 ENCOUNTER — Other Ambulatory Visit (HOSPITAL_COMMUNITY): Payer: Self-pay

## 2020-12-12 ENCOUNTER — Other Ambulatory Visit (HOSPITAL_COMMUNITY): Payer: Self-pay

## 2020-12-13 ENCOUNTER — Inpatient Hospital Stay (HOSPITAL_COMMUNITY): Payer: Medicaid Other | Attending: Hematology | Admitting: Hematology and Oncology

## 2020-12-13 ENCOUNTER — Inpatient Hospital Stay (HOSPITAL_COMMUNITY): Payer: Medicaid Other

## 2020-12-13 ENCOUNTER — Other Ambulatory Visit: Payer: Self-pay

## 2020-12-13 VITALS — BP 139/75 | HR 66 | Temp 96.8°F | Resp 18 | Wt 179.2 lb

## 2020-12-13 DIAGNOSIS — C61 Malignant neoplasm of prostate: Secondary | ICD-10-CM | POA: Diagnosis present

## 2020-12-13 DIAGNOSIS — C7951 Secondary malignant neoplasm of bone: Secondary | ICD-10-CM | POA: Insufficient documentation

## 2020-12-13 DIAGNOSIS — N189 Chronic kidney disease, unspecified: Secondary | ICD-10-CM | POA: Diagnosis not present

## 2020-12-13 DIAGNOSIS — D631 Anemia in chronic kidney disease: Secondary | ICD-10-CM | POA: Diagnosis not present

## 2020-12-13 LAB — COMPREHENSIVE METABOLIC PANEL
ALT: 15 U/L (ref 0–44)
AST: 17 U/L (ref 15–41)
Albumin: 4.2 g/dL (ref 3.5–5.0)
Alkaline Phosphatase: 52 U/L (ref 38–126)
Anion gap: 11 (ref 5–15)
BUN: 20 mg/dL (ref 8–23)
CO2: 25 mmol/L (ref 22–32)
Calcium: 9.2 mg/dL (ref 8.9–10.3)
Chloride: 107 mmol/L (ref 98–111)
Creatinine, Ser: 1.4 mg/dL — ABNORMAL HIGH (ref 0.61–1.24)
GFR, Estimated: 51 mL/min — ABNORMAL LOW (ref >60)
Glucose, Bld: 93 mg/dL (ref 70–99)
Potassium: 3.5 mmol/L (ref 3.5–5.1)
Sodium: 143 mmol/L (ref 135–145)
Total Bilirubin: 1 mg/dL (ref 0.3–1.2)
Total Protein: 7.4 g/dL (ref 6.5–8.1)

## 2020-12-13 LAB — CBC WITH DIFFERENTIAL/PLATELET
Abs Immature Granulocytes: 0.01 10*3/uL (ref 0.00–0.07)
Basophils Absolute: 0 10*3/uL (ref 0.0–0.1)
Basophils Relative: 0 %
Eosinophils Absolute: 0 10*3/uL (ref 0.0–0.5)
Eosinophils Relative: 0 %
HCT: 36.5 % — ABNORMAL LOW (ref 39.0–52.0)
Hemoglobin: 12.4 g/dL — ABNORMAL LOW (ref 13.0–17.0)
Immature Granulocytes: 0 %
Lymphocytes Relative: 41 %
Lymphs Abs: 2 10*3/uL (ref 0.7–4.0)
MCH: 32.4 pg (ref 26.0–34.0)
MCHC: 34 g/dL (ref 30.0–36.0)
MCV: 95.3 fL (ref 80.0–100.0)
Monocytes Absolute: 0.4 10*3/uL (ref 0.1–1.0)
Monocytes Relative: 8 %
Neutro Abs: 2.4 10*3/uL (ref 1.7–7.7)
Neutrophils Relative %: 51 %
Platelets: 210 10*3/uL (ref 150–400)
RBC: 3.83 MIL/uL — ABNORMAL LOW (ref 4.22–5.81)
RDW: 13.6 % (ref 11.5–15.5)
WBC: 4.8 10*3/uL (ref 4.0–10.5)
nRBC: 0 % (ref 0.0–0.2)

## 2020-12-13 LAB — PSA: Prostatic Specific Antigen: 0.13 ng/mL (ref 0.00–4.00)

## 2020-12-13 MED ORDER — DENOSUMAB 120 MG/1.7ML ~~LOC~~ SOLN
120.0000 mg | Freq: Once | SUBCUTANEOUS | Status: AC
  Administered 2020-12-13: 120 mg via SUBCUTANEOUS
  Filled 2020-12-13: qty 1.7

## 2020-12-13 NOTE — Progress Notes (Signed)
Patient presents today for Xgeva injection, Okay to proceed with treatment per Dr. Lorenso Courier. Patient tolerated Xgeva injection with no complaints voiced. Site clean and dry with no bruising or swelling noted at site. See MAR for details. Band aid applied.  Patient stable during and after injection. VSS with discharge and left in satisfactory condition with no s/s of distress noted.

## 2020-12-13 NOTE — Progress Notes (Signed)
[address] [place], [place] [address]   CLINIC:  Medical Oncology/Hematology  PCP:  Raiford Simmonds., PA-C [place] / [place] [address] [phone]   REASON FOR VISIT:  Follow-up for metastatic prostate cancer  PRIOR THERAPY:  1. Bilateral orchiectomy on 09/17/2017. 2. Docetaxel x 6 cycles from 11/19/2017 to 03/05/2018.  NGS Results: Not done  CURRENT THERAPY: Zytiga 1,000 mg daily; Xgeva monthly  BRIEF ONCOLOGIC HISTORY:  Oncology History  Malignant neoplasm of prostate ([facility])  09/27/2017 Initial Diagnosis   Malignant neoplasm of prostate ([facility])   11/19/2017 - 03/07/2018 Chemotherapy   The patient had pegfilgrastim (NEULASTA) injection 6 mg, 6 mg, Subcutaneous, Once, 6 of 6 cycles Administration: 6 mg (11/21/2017), 6 mg (12/12/2017), 6 mg (01/02/2018), 6 mg (01/24/2018), 6 mg (02/14/2018), 6 mg (03/07/2018) pegfilgrastim (NEULASTA ONPRO KIT) injection 6 mg, 6 mg, Subcutaneous, Once, 2 of 2 cycles DOCEtaxel (TAXOTERE) 140 mg in sodium chloride 0.9 % 250 mL chemo infusion, 75 mg/m2 = 140 mg, Intravenous,  Once, 6 of 6 cycles Administration: 140 mg (11/19/2017), 140 mg (12/10/2017), 140 mg (01/22/2018), 140 mg (12/31/2017), 140 mg (02/12/2018), 140 mg (03/05/2018) ondansetron (ZOFRAN) 8 mg, dexamethasone (DECADRON) 10 mg in sodium chloride 0.9 % 50 mL IVPB, , Intravenous,  Once, 4 of 4 cycles Administration:  (12/31/2017),  (01/22/2018),  (02/12/2018),  (03/05/2018)   for chemotherapy treatment.       CANCER STAGING: Cancer Staging No matching staging information was found for the patient.  INTERVAL HISTORY:  Mr. Adger Cantera, a 80 y.o. male, returns for routine follow-up of his metastatic prostate cancer. Kord was last seen on 10/18/2020.   On exam today Mr. Blackwell reports he has been well in the interim since our last visit.  He has been compliant with his abiraterone and prednisone with some occasional brief hot flashes.  He notes  that he has not been having any bone pain or back pain.  He notes that his energy is quite good and he is eating well. His weight is stable at 179 lbs. He is taking Zytiga 1,000 mg before breakfast and prednisone 5 mg with breakfast. He denies having any jaw pain. A full 10 point ROS is listed below.    REVIEW OF SYSTEMS:  Review of Systems  Constitutional:  Positive for fatigue (25%). Negative for appetite change.  Respiratory:  Positive for shortness of breath (w/ exertion).   Cardiovascular:  Positive for leg swelling (L leg).  Musculoskeletal:  Positive for back pain (2/10 lower back pain, occasionally radiating).  Neurological:  Positive for dizziness (occasional).  All other systems reviewed and are negative.  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 ([facility]) 09/27/2017   Sickle cell trait ([facility])    Vertigo    Past Surgical History:  Procedure Laterality Date   CYSTOSCOPY WITH FULGERATION  09/17/2017   Procedure: CYSTOSCOPY WITH FULGERATION OF BLADDER NECK;  Surgeon: [name], MD;  Location: [facility];  Service: Urology;;   CYSTOSCOPY WITH STENT PLACEMENT Right 09/17/2017   Procedure: CYSTOSCOPY WITH RIGHT RETROGRADE PYELOGRAM ATTEMPTED STENT PLACEMENT;  Surgeon: [name], MD;  Location: [facility];  Service: Urology;  Laterality: Right;   left leg surgery due to [place] Bilateral 09/17/2017   Procedure: ORCHIECTOMY;  Surgeon: [name], MD;  Location: [facility];  Service:  Urology;  Laterality: Bilateral;   PORTACATH PLACEMENT Left 10/14/2017   Procedure: INSERTION PORT-A-CATH;  Surgeon: Aviva Signs, MD;  Location: [facility];  Service: General;  Laterality: Left;   PROSTATE BIOPSY N/A 09/17/2017   Procedure: BIOPSY TRANSRECTAL ULTRASONIC PROSTATE (TUBP);  Surgeon: [name], MD;  Location: [facility];  Service: Urology;  Laterality: N/A;    SOCIAL HISTORY:  Social History    Socioeconomic History   Marital status: Single    Spouse name: Not on file   Number of children: Not on file   Years of education: Not on file   Highest education level: Not on file  Occupational History   Not on file  Tobacco Use   Smoking status: Never   Smokeless tobacco: Never  Vaping Use   Vaping Use: Never used  Substance and Sexual Activity   Alcohol use: Yes    Comment: Drinks beer occasionally    Drug use: No   Sexual activity: Not Currently  Other Topics Concern   Not on file  Social History Narrative   Not on file   Social Determinants of Health   Financial Resource Strain: Low Risk    Difficulty of Paying Living Expenses: Not hard at all  Food Insecurity: No Food Insecurity   Worried About Charity fundraiser in the Last Year: Never true   [place] in the Last Year: Never true  Transportation Needs: No Transportation Needs   Lack of Transportation (Medical): No   Lack of Transportation (Non-Medical): No  Physical Activity: Inactive   Days of Exercise per Week: 0 days   Minutes of Exercise per Session: 0 min  Stress: No Stress Concern Present   Feeling of Stress : Not at all  Social Connections: Moderately Isolated   Frequency of Communication with Friends and Family: More than three times a week   Frequency of Social Gatherings with Friends and Family: More than three times a week   Attends Religious Services: More than 4 times per year   Active Member of [employer] or Organizations: No   Attends Music therapist: Never   Marital Status: Divorced  Human resources officer Violence: Not At Risk   Fear of Current or Ex-Partner: No   Emotionally Abused: No   Physically Abused: No   Sexually Abused: No    FAMILY HISTORY:  Family History  Problem Relation Age of Onset   Hypertension Mother    Lung cancer Father    Lung cancer Brother    HIV/AIDS Brother    Breast cancer Daughter     CURRENT MEDICATIONS:  Current Outpatient Medications   Medication Sig Dispense Refill   abiraterone acetate (ZYTIGA) 250 MG tablet TAKE 4 TABLETS (1,000 MG TOTAL) BY MOUTH DAILY. TAKE ON AN EMPTY STOMACH 1 HOUR BEFORE OR 2 HOURS AFTER A MEAL 120 tablet 0   abiraterone acetate (ZYTIGA) 250 MG tablet TAKE 4 TABLETS (1,000 MG TOTAL) BY MOUTH DAILY. TAKE ON AN EMPTY STOMACH 1 HOUR BEFORE OR 2 HOURS AFTER A MEAL 120 tablet 0   docusate sodium (COLACE) 100 MG capsule Take 100 mg by mouth daily.     dronabinol (MARINOL) 5 MG capsule Take 1 capsule (5 mg total) by mouth 2 (two) times daily before a meal. 60 capsule 1   Misc. Devices MISC Please provide patient with a rollaider that has a seat. 1 each 0   predniSONE (DELTASONE) 5 MG tablet TAKE 1 TABLET BY MOUTH ONCE A DAY WITH BREAKFAST  30 tablet 6   No current facility-administered medications for this visit.    ALLERGIES:  No Known Allergies  PHYSICAL EXAM:  Performance status (ECOG): 1 - Symptomatic but completely ambulatory  Vitals:   12/13/20 1123  BP: 139/75  Pulse: 66  Resp: 18  Temp: (!) 96.8 F (36 C)  SpO2: 100%   Wt Readings from Last 3 Encounters:  12/13/20 179 lb 3.2 oz (81.3 kg)  10/18/20 177 lb (80.3 kg)  08/22/20 178 lb (80.7 kg)   Physical Exam Vitals reviewed.  Constitutional:      Appearance: Normal appearance.  Cardiovascular:     Rate and Rhythm: Normal rate and regular rhythm.     Pulses: Normal pulses.     Heart sounds: Normal heart sounds.  Pulmonary:     Effort: Pulmonary effort is normal.     Breath sounds: Normal breath sounds.  Musculoskeletal:     Right lower leg: No edema.     Left lower leg: No edema.  Neurological:     General: No focal deficit present.     Mental Status: He is alert and oriented to person, place, and time.  Psychiatric:        Mood and Affect: Mood normal.        Behavior: Behavior normal.     LABORATORY DATA:  I have reviewed the labs as listed.  CBC Latest Ref Rng & Units 12/13/2020 11/17/2020 10/18/2020  WBC 4.0 - 10.5  K/uL 4.8 5.6 5.6  Hemoglobin 13.0 - 17.0 g/dL 12.4(L) 12.2(L) 12.3(L)  Hematocrit 39.0 - 52.0 % 36.5(L) 35.6(L) 37.5(L)  Platelets 150 - 400 K/uL 210 246 144(L)   CMP Latest Ref Rng & Units 12/13/2020 11/17/2020 10/18/2020  Glucose 70 - 99 mg/dL 93 121(H) 98  BUN 8 - 23 mg/dL 20 25(H) 16  Creatinine 0.61 - 1.24 mg/dL 1.40(H) 1.53(H) 1.41(H)  Sodium 135 - 145 mmol/L 143 137 140  Potassium 3.5 - 5.1 mmol/L 3.5 4.2 3.5  Chloride 98 - 111 mmol/L 107 105 108  CO2 22 - 32 mmol/L '25 25 24  ' Calcium 8.9 - 10.3 mg/dL 9.2 9.0 8.8(L)  Total Protein 6.5 - 8.1 g/dL 7.4 6.8 6.9  Total Bilirubin 0.3 - 1.2 mg/dL 1.0 0.9 1.1  Alkaline Phos 38 - 126 U/L 52 49 51  AST 15 - 41 U/L 17 14(L) 19  ALT 0 - 44 U/L '15 14 17   ' PSA 0.05 03/03/2020  PSA 0.17 03/31/2020  PSA 0.17 05/02/2020    DIAGNOSTIC IMAGING:  I have independently reviewed the scans and discussed with the patient. No results found.   ASSESSMENT:  1.  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 -6 cycles of docetaxel from 11/19/2017 through 03/05/2018. -Abiraterone and prednisone started on 05/07/2018.  PSA was 4.52.   2.  Bone metastasis: -Denosumab started on 07/21/2018.   3.  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  Last Feraheme on 01/22/2018.   4.  CKD: -Baseline creatinine between 1.5-1.6.   PLAN:  1.  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 - CTAP on 06/09/2020 with no nodal or visceral metastasis. - Continue Abiraterone 1000 mg daily along with prednisone 5 mg daily. - His PSA is stable at 0.15 at last visit on 11/17/2020.  LFTs are normal. - RTC 2 months for follow-up.   2.  Bone metastasis: -His calcium is normal today.  He cannot tolerate calcium pills due to constipation.  He drinks orange juice  with calcium in it. - Continue monthly denosumab.   3.  Normocytic anemia: -Combination from a CKD and relative iron deficiency.  Hemoglobin today is 12.4   4.  Back pain: -He has reported on and off back pain if he stands for too long. - Pain improves after resting.   5.  CKD: - Creatinine today at baseline, 1.40.    Orders placed this encounter:  No orders of the defined types were placed in this encounter.  Ledell Peoples, MD Department of Hematology/Oncology [place] at [facility] Phone: [phone] Pager: [phone] Email: [email or web address]

## 2020-12-13 NOTE — Patient Instructions (Signed)
[facility]  Discharge Instructions: Thank you for choosing [facility] to provide your oncology and hematology care.  If you have a lab appointment with the [facility], please come in thru the Main Entrance and check in at the main information desk.  Wear comfortable clothing and clothing appropriate for easy access to any Portacath or PICC line.   We strive to give you quality time with your provider. You may need to reschedule your appointment if you arrive late (15 or more minutes).  Arriving late affects you and other patients whose appointments are after yours.  Also, if you miss three or more appointments without notifying the office, you may be dismissed from the clinic at the provider's discretion.      For prescription refill requests, have your pharmacy contact our office and allow 72 hours for refills to be completed.    Today you received the following: Xgeva. Return as scheduled.   To help prevent nausea and vomiting after your treatment, we encourage you to take your nausea medication as directed.  BELOW ARE SYMPTOMS THAT SHOULD BE REPORTED IMMEDIATELY: *FEVER GREATER THAN 100.4 F (38 C) OR HIGHER *CHILLS OR SWEATING *NAUSEA AND VOMITING THAT IS NOT CONTROLLED WITH YOUR NAUSEA MEDICATION *UNUSUAL SHORTNESS OF BREATH *UNUSUAL BRUISING OR BLEEDING *URINARY PROBLEMS (pain or burning when urinating, or frequent urination) *BOWEL PROBLEMS (unusual diarrhea, constipation, pain near the anus) TENDERNESS IN MOUTH AND THROAT WITH OR WITHOUT PRESENCE OF ULCERS (sore throat, sores in mouth, or a toothache) UNUSUAL RASH, SWELLING OR PAIN  UNUSUAL VAGINAL DISCHARGE OR ITCHING   Items with * indicate a potential emergency and should be followed up as soon as possible or go to the Emergency Department if any problems should occur.  Please show the CHEMOTHERAPY ALERT CARD or IMMUNOTHERAPY ALERT CARD at check-in to the Emergency Department and triage  nurse.  Should you have questions after your visit or need to cancel or reschedule your appointment, please contact [facility] [phone]  and follow the prompts.  Office hours are 8:00 a.m. to 4:30 p.m. Monday - Friday. Please note that voicemails left after 4:00 p.m. may not be returned until the following business day.  We are closed weekends and major holidays. You have access to a nurse at all times for urgent questions. Please call the main number to the clinic [phone] and follow the prompts.  For any non-urgent questions, you may also contact your provider using MyChart. We now offer e-Visits for anyone 78 and older to request care online for non-urgent symptoms. For details visit [email or web address].   Also download the MyChart app! Go to the app store, search "MyChart", open the app, select [facility], and log in with your MyChart username and password.  Due to Covid, a mask is required upon entering the hospital/clinic. If you do not have a mask, one will be given to you upon arrival. For doctor visits, patients may have 1 support person aged 45 or older with them. For treatment visits, patients cannot have anyone with them due to current Covid guidelines and our immunocompromised population.

## 2020-12-15 ENCOUNTER — Other Ambulatory Visit (HOSPITAL_COMMUNITY): Payer: Self-pay

## 2020-12-23 ENCOUNTER — Other Ambulatory Visit (HOSPITAL_COMMUNITY): Payer: Self-pay

## 2020-12-23 ENCOUNTER — Other Ambulatory Visit (HOSPITAL_COMMUNITY): Payer: Self-pay | Admitting: Physician Assistant

## 2020-12-23 DIAGNOSIS — C61 Malignant neoplasm of prostate: Secondary | ICD-10-CM

## 2020-12-23 MED ORDER — ABIRATERONE ACETATE 250 MG PO TABS
ORAL_TABLET | Freq: Every day | ORAL | 0 refills | Status: DC
  Filled 2020-12-23: qty 120, 30d supply, fill #0

## 2020-12-23 MED FILL — Prednisone Tab 5 MG: ORAL | 30 days supply | Qty: 30 | Fill #3 | Status: AC

## 2021-01-09 ENCOUNTER — Other Ambulatory Visit (HOSPITAL_COMMUNITY): Payer: Self-pay

## 2021-01-10 ENCOUNTER — Encounter (HOSPITAL_COMMUNITY): Payer: Self-pay

## 2021-01-10 ENCOUNTER — Other Ambulatory Visit: Payer: Self-pay

## 2021-01-10 ENCOUNTER — Inpatient Hospital Stay (HOSPITAL_COMMUNITY): Payer: Medicaid Other | Attending: Hematology

## 2021-01-10 ENCOUNTER — Inpatient Hospital Stay (HOSPITAL_COMMUNITY): Payer: Medicaid Other

## 2021-01-10 VITALS — BP 144/81 | HR 72 | Temp 96.7°F | Resp 19 | Wt 179.4 lb

## 2021-01-10 DIAGNOSIS — C61 Malignant neoplasm of prostate: Secondary | ICD-10-CM

## 2021-01-10 DIAGNOSIS — C7951 Secondary malignant neoplasm of bone: Secondary | ICD-10-CM | POA: Insufficient documentation

## 2021-01-10 LAB — CBC WITH DIFFERENTIAL/PLATELET
Abs Immature Granulocytes: 0.02 10*3/uL (ref 0.00–0.07)
Basophils Absolute: 0 10*3/uL (ref 0.0–0.1)
Basophils Relative: 1 %
Eosinophils Absolute: 0 10*3/uL (ref 0.0–0.5)
Eosinophils Relative: 0 %
HCT: 36.2 % — ABNORMAL LOW (ref 39.0–52.0)
Hemoglobin: 12.1 g/dL — ABNORMAL LOW (ref 13.0–17.0)
Immature Granulocytes: 0 %
Lymphocytes Relative: 46 %
Lymphs Abs: 2.2 10*3/uL (ref 0.7–4.0)
MCH: 32 pg (ref 26.0–34.0)
MCHC: 33.4 g/dL (ref 30.0–36.0)
MCV: 95.8 fL (ref 80.0–100.0)
Monocytes Absolute: 0.4 10*3/uL (ref 0.1–1.0)
Monocytes Relative: 9 %
Neutro Abs: 2.1 10*3/uL (ref 1.7–7.7)
Neutrophils Relative %: 44 %
Platelets: 207 10*3/uL (ref 150–400)
RBC: 3.78 MIL/uL — ABNORMAL LOW (ref 4.22–5.81)
RDW: 14.2 % (ref 11.5–15.5)
WBC: 4.7 10*3/uL (ref 4.0–10.5)
nRBC: 0 % (ref 0.0–0.2)

## 2021-01-10 LAB — COMPREHENSIVE METABOLIC PANEL
ALT: 13 U/L (ref 0–44)
AST: 20 U/L (ref 15–41)
Albumin: 4 g/dL (ref 3.5–5.0)
Alkaline Phosphatase: 47 U/L (ref 38–126)
Anion gap: 8 (ref 5–15)
BUN: 21 mg/dL (ref 8–23)
CO2: 22 mmol/L (ref 22–32)
Calcium: 8.7 mg/dL — ABNORMAL LOW (ref 8.9–10.3)
Chloride: 109 mmol/L (ref 98–111)
Creatinine, Ser: 1.46 mg/dL — ABNORMAL HIGH (ref 0.61–1.24)
GFR, Estimated: 48 mL/min — ABNORMAL LOW (ref >60)
Glucose, Bld: 87 mg/dL (ref 70–99)
Potassium: 3.4 mmol/L — ABNORMAL LOW (ref 3.5–5.1)
Sodium: 139 mmol/L (ref 135–145)
Total Bilirubin: 1.2 mg/dL (ref 0.3–1.2)
Total Protein: 7 g/dL (ref 6.5–8.1)

## 2021-01-10 LAB — PSA: Prostatic Specific Antigen: 0.14 ng/mL (ref 0.00–4.00)

## 2021-01-10 MED ORDER — DENOSUMAB 120 MG/1.7ML ~~LOC~~ SOLN
120.0000 mg | Freq: Once | SUBCUTANEOUS | Status: AC
  Administered 2021-01-10: 120 mg via SUBCUTANEOUS
  Filled 2021-01-10: qty 1.7

## 2021-01-10 NOTE — Progress Notes (Signed)
Patient presents today for Xgeva. Patient states he doesn't take Calcium at home d/t constipation, patient states he does take Vitamin D and C. Denied tooth, jaw, and leg pain. No recent or upcoming dental visits. Labs reviewed. Patient tolerated injection with no complaints voiced. See MAR for details. Patient stable during and after injection. Site clean and dry with no bruising or swelling noted. Band aid applied. Vss with discharge and left in satisfactory condition with no s/s of distress.

## 2021-01-10 NOTE — Patient Instructions (Signed)
[facility]  Discharge Instructions: Thank you for choosing [facility] to provide your oncology and hematology care.  If you have a lab appointment with the [facility], please come in thru the Main Entrance and check in at the main information desk.  Wear comfortable clothing and clothing appropriate for easy access to any Portacath or PICC line.   We strive to give you quality time with your provider. You may need to reschedule your appointment if you arrive late (15 or more minutes).  Arriving late affects you and other patients whose appointments are after yours.  Also, if you miss three or more appointments without notifying the office, you may be dismissed from the clinic at the provider's discretion.      For prescription refill requests, have your pharmacy contact our office and allow 72 hours for refills to be completed.    Today you received the following chemotherapy and/or immunotherapy agents; Xgeva. Return as scheduled.   To help prevent nausea and vomiting after your treatment, we encourage you to take your nausea medication as directed.  BELOW ARE SYMPTOMS THAT SHOULD BE REPORTED IMMEDIATELY: *FEVER GREATER THAN 100.4 F (38 C) OR HIGHER *CHILLS OR SWEATING *NAUSEA AND VOMITING THAT IS NOT CONTROLLED WITH YOUR NAUSEA MEDICATION *UNUSUAL SHORTNESS OF BREATH *UNUSUAL BRUISING OR BLEEDING *URINARY PROBLEMS (pain or burning when urinating, or frequent urination) *BOWEL PROBLEMS (unusual diarrhea, constipation, pain near the anus) TENDERNESS IN MOUTH AND THROAT WITH OR WITHOUT PRESENCE OF ULCERS (sore throat, sores in mouth, or a toothache) UNUSUAL RASH, SWELLING OR PAIN  UNUSUAL VAGINAL DISCHARGE OR ITCHING   Items with * indicate a potential emergency and should be followed up as soon as possible or go to the Emergency Department if any problems should occur.  Please show the CHEMOTHERAPY ALERT CARD or IMMUNOTHERAPY ALERT CARD at check-in to the  Emergency Department and triage nurse.  Should you have questions after your visit or need to cancel or reschedule your appointment, please contact [facility] [phone]  and follow the prompts.  Office hours are 8:00 a.m. to 4:30 p.m. Monday - Friday. Please note that voicemails left after 4:00 p.m. may not be returned until the following business day.  We are closed weekends and major holidays. You have access to a nurse at all times for urgent questions. Please call the main number to the clinic [phone] and follow the prompts.  For any non-urgent questions, you may also contact your provider using MyChart. We now offer e-Visits for anyone 33 and older to request care online for non-urgent symptoms. For details visit [email or web address].   Also download the MyChart app! Go to the app store, search "MyChart", open the app, select [facility], and log in with your MyChart username and password.  Due to Covid, a mask is required upon entering the hospital/clinic. If you do not have a mask, one will be given to you upon arrival. For doctor visits, patients may have 1 support person aged 88 or older with them. For treatment visits, patients cannot have anyone with them due to current Covid guidelines and our immunocompromised population.

## 2021-01-16 ENCOUNTER — Other Ambulatory Visit (HOSPITAL_COMMUNITY): Payer: Self-pay

## 2021-01-18 ENCOUNTER — Other Ambulatory Visit (HOSPITAL_COMMUNITY): Payer: Self-pay | Admitting: Physician Assistant

## 2021-01-18 ENCOUNTER — Other Ambulatory Visit (HOSPITAL_COMMUNITY): Payer: Self-pay

## 2021-01-18 DIAGNOSIS — C61 Malignant neoplasm of prostate: Secondary | ICD-10-CM

## 2021-01-18 MED ORDER — ABIRATERONE ACETATE 250 MG PO TABS
ORAL_TABLET | Freq: Every day | ORAL | 3 refills | Status: DC
  Filled 2021-01-18: qty 120, 30d supply, fill #0
  Filled 2021-02-15: qty 120, 30d supply, fill #1
  Filled 2021-03-28: qty 120, 30d supply, fill #2
  Filled 2021-04-13: qty 120, 30d supply, fill #3

## 2021-01-18 MED FILL — Prednisone Tab 5 MG: ORAL | 30 days supply | Qty: 30 | Fill #4 | Status: AC

## 2021-01-19 ENCOUNTER — Other Ambulatory Visit (HOSPITAL_COMMUNITY): Payer: Self-pay

## 2021-02-06 ENCOUNTER — Other Ambulatory Visit: Payer: Self-pay

## 2021-02-06 ENCOUNTER — Inpatient Hospital Stay (HOSPITAL_COMMUNITY): Payer: Medicaid Other | Attending: Hematology

## 2021-02-06 DIAGNOSIS — I129 Hypertensive chronic kidney disease with stage 1 through stage 4 chronic kidney disease, or unspecified chronic kidney disease: Secondary | ICD-10-CM | POA: Diagnosis not present

## 2021-02-06 DIAGNOSIS — N189 Chronic kidney disease, unspecified: Secondary | ICD-10-CM | POA: Diagnosis not present

## 2021-02-06 DIAGNOSIS — C61 Malignant neoplasm of prostate: Secondary | ICD-10-CM | POA: Diagnosis not present

## 2021-02-06 DIAGNOSIS — C7951 Secondary malignant neoplasm of bone: Secondary | ICD-10-CM | POA: Insufficient documentation

## 2021-02-06 DIAGNOSIS — G629 Polyneuropathy, unspecified: Secondary | ICD-10-CM | POA: Insufficient documentation

## 2021-02-06 DIAGNOSIS — D631 Anemia in chronic kidney disease: Secondary | ICD-10-CM | POA: Diagnosis not present

## 2021-02-06 LAB — COMPREHENSIVE METABOLIC PANEL
ALT: 12 U/L (ref 0–44)
AST: 15 U/L (ref 15–41)
Albumin: 3.9 g/dL (ref 3.5–5.0)
Alkaline Phosphatase: 48 U/L (ref 38–126)
Anion gap: 4 — ABNORMAL LOW (ref 5–15)
BUN: 20 mg/dL (ref 8–23)
CO2: 28 mmol/L (ref 22–32)
Calcium: 9.2 mg/dL (ref 8.9–10.3)
Chloride: 107 mmol/L (ref 98–111)
Creatinine, Ser: 1.53 mg/dL — ABNORMAL HIGH (ref 0.61–1.24)
GFR, Estimated: 46 mL/min — ABNORMAL LOW (ref >60)
Glucose, Bld: 97 mg/dL (ref 70–99)
Potassium: 3.7 mmol/L (ref 3.5–5.1)
Sodium: 139 mmol/L (ref 135–145)
Total Bilirubin: 1 mg/dL (ref 0.3–1.2)
Total Protein: 6.9 g/dL (ref 6.5–8.1)

## 2021-02-06 LAB — CBC WITH DIFFERENTIAL/PLATELET
Abs Immature Granulocytes: 0 10*3/uL (ref 0.00–0.07)
Basophils Absolute: 0 10*3/uL (ref 0.0–0.1)
Basophils Relative: 0 %
Eosinophils Absolute: 0 10*3/uL (ref 0.0–0.5)
Eosinophils Relative: 0 %
HCT: 35.1 % — ABNORMAL LOW (ref 39.0–52.0)
Hemoglobin: 11.7 g/dL — ABNORMAL LOW (ref 13.0–17.0)
Immature Granulocytes: 0 %
Lymphocytes Relative: 53 %
Lymphs Abs: 2.6 10*3/uL (ref 0.7–4.0)
MCH: 31.5 pg (ref 26.0–34.0)
MCHC: 33.3 g/dL (ref 30.0–36.0)
MCV: 94.6 fL (ref 80.0–100.0)
Monocytes Absolute: 0.4 10*3/uL (ref 0.1–1.0)
Monocytes Relative: 8 %
Neutro Abs: 2 10*3/uL (ref 1.7–7.7)
Neutrophils Relative %: 39 %
Platelets: 232 10*3/uL (ref 150–400)
RBC: 3.71 MIL/uL — ABNORMAL LOW (ref 4.22–5.81)
RDW: 13.8 % (ref 11.5–15.5)
WBC: 5 10*3/uL (ref 4.0–10.5)
nRBC: 0 % (ref 0.0–0.2)

## 2021-02-06 LAB — PSA: Prostatic Specific Antigen: 0.22 ng/mL (ref 0.00–4.00)

## 2021-02-06 NOTE — Progress Notes (Signed)
[place], [place] [address]   CLINIC:  Medical Oncology/Hematology  PCP:  Raiford Simmonds., PA-C [place] / [place] [address] [phone]   REASON FOR VISIT:  Follow-up for metastatic prostate cancer  PRIOR THERAPY:  1. Bilateral orchiectomy on 09/17/2017. 2. Docetaxel x 6 cycles from 11/19/2017 to 03/05/2018  NGS Results: not done  CURRENT THERAPY: Zytiga 1,000 mg daily; Xgeva monthly  BRIEF ONCOLOGIC HISTORY:  Oncology History  Malignant neoplasm of prostate ([place])  09/27/2017 Initial Diagnosis   Malignant neoplasm of prostate ([place])   11/19/2017 - 03/07/2018 Chemotherapy   The patient had pegfilgrastim (NEULASTA) injection 6 mg, 6 mg, Subcutaneous, Once, 6 of 6 cycles Administration: 6 mg (11/21/2017), 6 mg (12/12/2017), 6 mg (01/02/2018), 6 mg (01/24/2018), 6 mg (02/14/2018), 6 mg (03/07/2018) pegfilgrastim (NEULASTA ONPRO KIT) injection 6 mg, 6 mg, Subcutaneous, Once, 2 of 2 cycles DOCEtaxel (TAXOTERE) 140 mg in sodium chloride 0.9 % 250 mL chemo infusion, 75 mg/m2 = 140 mg, Intravenous,  Once, 6 of 6 cycles Administration: 140 mg (11/19/2017), 140 mg (12/10/2017), 140 mg (01/22/2018), 140 mg (12/31/2017), 140 mg (02/12/2018), 140 mg (03/05/2018) ondansetron (ZOFRAN) 8 mg, dexamethasone (DECADRON) 10 mg in sodium chloride 0.9 % 50 mL IVPB, , Intravenous,  Once, 4 of 4 cycles Administration:  (12/31/2017),  (01/22/2018),  (02/12/2018),  (03/05/2018)   for chemotherapy treatment.       CANCER STAGING: Cancer Staging No matching staging information was found for the patient.  INTERVAL HISTORY:  [name], a 80 y.o. male, returns for routine follow-up of his metastatic prostate cancer. Wilber was last seen on 12/13/2020 by Dr. Narda Rutherford.   Today he reports feeling well. He denies n/v/d and reports tolerable hot flashes throughout the day. He reports shooting, tingling pain on the top of his feet at night, and he  also reports dizziness.   REVIEW OF SYSTEMS:  Review of Systems  Constitutional:  Positive for fatigue (25%). Negative for appetite change.  HENT:   Positive for trouble swallowing (chewing).   Gastrointestinal:  Negative for diarrhea, nausea and vomiting.  Endocrine: Positive for hot flashes.  Neurological:  Positive for dizziness (vertigo) and numbness (neuropathy).  Psychiatric/Behavioral:  Positive for sleep disturbance.   All other systems reviewed and are negative.  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 ([place]) 09/27/2017   Sickle cell trait ([place])    Vertigo    Past Surgical History:  Procedure Laterality Date   CYSTOSCOPY WITH FULGERATION  09/17/2017   Procedure: CYSTOSCOPY WITH FULGERATION OF BLADDER NECK;  Surgeon: [name], MD;  Location: [facility];  Service: Urology;;   CYSTOSCOPY WITH STENT PLACEMENT Right 09/17/2017   Procedure: CYSTOSCOPY WITH RIGHT RETROGRADE PYELOGRAM ATTEMPTED STENT PLACEMENT;  Surgeon: [name], MD;  Location: [facility];  Service: Urology;  Laterality: Right;   left leg surgery due to [place] Bilateral 09/17/2017   Procedure: ORCHIECTOMY;  Surgeon: [name], MD;  Location: [facility];  Service: Urology;  Laterality: Bilateral;   PORTACATH PLACEMENT Left 10/14/2017   Procedure: INSERTION PORT-A-CATH;  Surgeon: Aviva Signs, MD;  Location: [facility];  Service: General;  Laterality: Left;   PROSTATE BIOPSY N/A 09/17/2017   Procedure: BIOPSY TRANSRECTAL ULTRASONIC PROSTATE (TUBP);  Surgeon: [name], MD;  Location: [facility];  Service: Urology;  Laterality: N/A;    SOCIAL HISTORY:  Social History   Socioeconomic History   Marital status: Single    Spouse name: Not on file   Number of children: Not on file   Years of education: Not on file   Highest education level: Not on file  Occupational History   Not on file  Tobacco Use    Smoking status: Never   Smokeless tobacco: Never  Vaping Use   Vaping Use: Never used  Substance and Sexual Activity   Alcohol use: Yes    Comment: Drinks beer occasionally    Drug use: No   Sexual activity: Not Currently  Other Topics Concern   Not on file  Social History Narrative   Not on file   Social Determinants of Health   Financial Resource Strain: Low Risk    Difficulty of Paying Living Expenses: Not hard at all  Food Insecurity: No Food Insecurity   Worried About Charity fundraiser in the Last Year: Never true   [place] in the Last Year: Never true  Transportation Needs: No Transportation Needs   Lack of Transportation (Medical): No   Lack of Transportation (Non-Medical): No  Physical Activity: Inactive   Days of Exercise per Week: 0 days   Minutes of Exercise per Session: 0 min  Stress: No Stress Concern Present   Feeling of Stress : Not at all  Social Connections: Moderately Isolated   Frequency of Communication with Friends and Family: More than three times a week   Frequency of Social Gatherings with Friends and Family: More than three times a week   Attends Religious Services: More than 4 times per year   Active Member of [employer] or Organizations: No   Attends Music therapist: Never   Marital Status: Divorced  Human resources officer Violence: Not At Risk   Fear of Current or Ex-Partner: No   Emotionally Abused: No   Physically Abused: No   Sexually Abused: No    FAMILY HISTORY:  Family History  Problem Relation Age of Onset   Hypertension Mother    Lung cancer Father    Lung cancer Brother    HIV/AIDS Brother    Breast cancer Daughter     CURRENT MEDICATIONS:  Current Outpatient Medications  Medication Sig Dispense Refill   abiraterone acetate (ZYTIGA) 250 MG tablet TAKE 4 TABLETS (1,000 MG TOTAL) BY MOUTH DAILY. TAKE ON AN EMPTY STOMACH 1 HOUR BEFORE OR 2 HOURS AFTER A MEAL 120 tablet 3   docusate sodium (COLACE) 100 MG capsule  Take 100 mg by mouth daily.     gabapentin (NEURONTIN) 300 MG capsule Take 1 capsule (300 mg total) by mouth at bedtime. 30 capsule 3   Misc. Devices MISC Please provide patient with a rollaider that has a seat. 1 each 0   predniSONE (DELTASONE) 5 MG tablet TAKE 1 TABLET BY MOUTH ONCE A DAY WITH BREAKFAST 30 tablet 6   dronabinol (MARINOL) 5 MG capsule Take 1 capsule (5 mg total) by mouth 2 (two) times daily before a meal. (Patient not taking: Reported on 02/07/2021) 60 capsule 1   No current facility-administered medications for this visit.    ALLERGIES:  No Known Allergies  PHYSICAL EXAM:  Performance status (ECOG): 1 - Symptomatic but completely ambulatory  Vitals:   02/07/21 0809  BP: (!) 181/93  Pulse: (!) 59  Resp: 18  Temp: (!) 96.7 F (35.9 C)  SpO2: 90%   Wt Readings from  Last 3 Encounters:  02/07/21 180 lb (81.6 kg)  01/10/21 179 lb 6.4 oz (81.4 kg)  12/13/20 179 lb 3.2 oz (81.3 kg)   Physical Exam Vitals reviewed.  Constitutional:      Appearance: Normal appearance.  Cardiovascular:     Rate and Rhythm: Normal rate and regular rhythm.     Pulses: Normal pulses.     Heart sounds: Normal heart sounds.  Pulmonary:     Effort: Pulmonary effort is normal.     Breath sounds: Normal breath sounds.  Neurological:     General: No focal deficit present.     Mental Status: He is alert and oriented to person, place, and time.  Psychiatric:        Mood and Affect: Mood normal.        Behavior: Behavior normal.     LABORATORY DATA:  I have reviewed the labs as listed.  CBC Latest Ref Rng & Units 02/06/2021 01/10/2021 12/13/2020  WBC 4.0 - 10.5 K/uL 5.0 4.7 4.8  Hemoglobin 13.0 - 17.0 g/dL 11.7(L) 12.1(L) 12.4(L)  Hematocrit 39.0 - 52.0 % 35.1(L) 36.2(L) 36.5(L)  Platelets 150 - 400 K/uL 232 207 210   CMP Latest Ref Rng & Units 02/06/2021 01/10/2021 12/13/2020  Glucose 70 - 99 mg/dL 97 87 93  BUN 8 - 23 mg/dL _0 Creatinine 0.61 - 1.24 mg/dL 1.53(H) 1.46(H)  1.40(H)  Sodium 135 - 145 mmol/L 139 139 143  Potassium 3.5 - 5.1 mmol/L 3.7 3.4(L) 3.5  Chloride 98 - 111 mmol/L 107 109 107  CO2 22 - 32 mmol/L _1 Calcium 8.9 - 10.3 mg/dL 9.2 8.7(L) 9.2  Total Protein 6.5 - 8.1 g/dL 6.9 7.0 7.4  Total Bilirubin 0.3 - 1.2 mg/dL 1.0 1.2 1.0  Alkaline Phos 38 - 126 U/L 48 47 52  AST 15 - 41 U/L _2 ALT 0 - 44 U/L _3 DIAGNOSTIC IMAGING:  I have independently reviewed the scans and discussed with the patient. No results found.   ASSESSMENT:  1.  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 -6 cycles of docetaxel from 11/19/2017 through 03/05/2018. -Abiraterone and prednisone started on 05/07/2018.  PSA was 4.52.   2.  Bone metastasis: -Denosumab started on 07/21/2018.   3.  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  Last Feraheme on 01/22/2018.   4.  CKD: -Baseline creatinine between 1.5-1.6.   PLAN:  1.  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 - PSA is slightly high at 0.22, up from 0.14 in September.  PSA was 0.13 on 12/13/2020. - Continue Abiraterone 1000 mg daily on empty stomach along with prednisone 5 mg daily. - RTC 2 months with follow-up PSA.   2.  Bone metastasis: - His calcium is within normal limits.  Continue orange juice with calcium in it. - We will continue monthly denosumab.   3.  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 - Hemoglobin today is 11.7 with MCV 94. - We will check ferritin, X93, iron panel, folic acid at next visit.   4.  Peripheral neuropathy: - He reports a neuropathic pains in the feet at nighttime. - We will start him on gabapentin 300 mg at bedtime.   5.  CKD: - Baseline creatinine between 1.4-1.6.  Today creatinine  is 1.53.  6.  Hypertension: - His  blood pressure today is 181/93. - We will start him on Norvasc 5 mg daily.   Orders placed this encounter:  No orders of the defined types were placed in this encounter.    Derek Jack, MD [place] [phone]   I, Thana Ates, am acting as a scribe for Dr. Derek Jack.  I, Derek Jack MD, have reviewed the above documentation for accuracy and completeness, and I agree with the above.

## 2021-02-07 ENCOUNTER — Inpatient Hospital Stay (HOSPITAL_COMMUNITY): Payer: Medicaid Other

## 2021-02-07 ENCOUNTER — Inpatient Hospital Stay (HOSPITAL_BASED_OUTPATIENT_CLINIC_OR_DEPARTMENT_OTHER): Payer: Medicaid Other | Admitting: Hematology

## 2021-02-07 VITALS — BP 181/93 | HR 59 | Temp 96.7°F | Resp 18 | Wt 180.0 lb

## 2021-02-07 DIAGNOSIS — C61 Malignant neoplasm of prostate: Secondary | ICD-10-CM

## 2021-02-07 MED ORDER — GABAPENTIN 300 MG PO CAPS: 300.0000 mg | ORAL_CAPSULE | Freq: Every day | ORAL | 3 refills | Status: DC

## 2021-02-07 MED ORDER — AMLODIPINE BESYLATE 5 MG PO TABS: 5.0000 mg | ORAL_TABLET | Freq: Every day | ORAL | 1 refills | Status: DC

## 2021-02-07 MED ORDER — DENOSUMAB 120 MG/1.7ML ~~LOC~~ SOLN
120.0000 mg | Freq: Once | SUBCUTANEOUS | Status: AC
  Administered 2021-02-07: 120 mg via SUBCUTANEOUS
  Filled 2021-02-07: qty 1.7

## 2021-02-07 NOTE — Progress Notes (Signed)
Patient is taking Zytiga as prescribed.  He has not missed any doses and reportd no side effects at this time.

## 2021-02-07 NOTE — Patient Instructions (Signed)
[facility]  Discharge Instructions: Thank you for choosing [facility] to provide your oncology and hematology care.  If you have a lab appointment with the Cancer Center, please come in thru the Main Entrance and check in at the main information desk.  Wear comfortable clothing and clothing appropriate for easy access to any Portacath or PICC line.   We strive to give you quality time with your provider. You may need to reschedule your appointment if you arrive late (15 or more minutes).  Arriving late affects you and other patients whose appointments are after yours.  Also, if you miss three or more appointments without notifying the office, you may be dismissed from the clinic at the provider's discretion.      For prescription refill requests, have your pharmacy contact our office and allow 72 hours for refills to be completed.    Today you received the following chemotherapy and/or immunotherapy agents Xgeva      To help prevent nausea and vomiting after your treatment, we encourage you to take your nausea medication as directed.  BELOW ARE SYMPTOMS THAT SHOULD BE REPORTED IMMEDIATELY: *FEVER GREATER THAN 100.4 F (38 C) OR HIGHER *CHILLS OR SWEATING *NAUSEA AND VOMITING THAT IS NOT CONTROLLED WITH YOUR NAUSEA MEDICATION *UNUSUAL SHORTNESS OF BREATH *UNUSUAL BRUISING OR BLEEDING *URINARY PROBLEMS (pain or burning when urinating, or frequent urination) *BOWEL PROBLEMS (unusual diarrhea, constipation, pain near the anus) TENDERNESS IN MOUTH AND THROAT WITH OR WITHOUT PRESENCE OF ULCERS (sore throat, sores in mouth, or a toothache) UNUSUAL RASH, SWELLING OR PAIN  UNUSUAL VAGINAL DISCHARGE OR ITCHING   Items with * indicate a potential emergency and should be followed up as soon as possible or go to the Emergency Department if any problems should occur.  Please show the CHEMOTHERAPY ALERT CARD or IMMUNOTHERAPY ALERT CARD at check-in to the Emergency Department  and triage nurse.  Should you have questions after your visit or need to cancel or reschedule your appointment, please contact [facility] [phone]  and follow the prompts.  Office hours are 8:00 a.m. to 4:30 p.m. Monday - Friday. Please note that voicemails left after 4:00 p.m. may not be returned until the following business day.  We are closed weekends and major holidays. You have access to a nurse at all times for urgent questions. Please call the main number to the clinic [phone] and follow the prompts.  For any non-urgent questions, you may also contact your provider using MyChart. We now offer e-Visits for anyone 18 and older to request care online for non-urgent symptoms. For details visit [email or web address].   Also download the MyChart app! Go to the app store, search "MyChart", open the app, select [facility], and log in with your MyChart username and password.  Due to Covid, a mask is required upon entering the hospital/clinic. If you do not have a mask, one will be given to you upon arrival. For doctor visits, patients may have 1 support person aged 18 or older with them. For treatment visits, patients cannot have anyone with them due to current Covid guidelines and our immunocompromised population.  

## 2021-02-07 NOTE — Patient Instructions (Addendum)
[place] [facility] Discharge Instructions  You were seen and examined by Dr. Delton Coombes. Dr. Delton Coombes discussed how you are tolerating Zytiga. For the neuropathy in your feet, Dr. Delton Coombes has prescribed Gabapentin 300mg  once daily at bedtime. Your blood pressure was also elevated today, the Zytiga can cause your blood pressure to increase so Dr. Delton Coombes has prescribed Norvasc 5mg  each morning to decrease your blood pressure.  Dr. Delton Coombes reviewed your recent lab work. Your PSA is slightly increased from the last time but this is not a significant change. It will be rechecked in 2 months. Continue Zytiga as prescribed.  Follow-up as scheduled.   Thank you for choosing [place] [facility] to provide your oncology and hematology care.  To afford each patient quality time with our provider, please arrive at least 15 minutes before your scheduled appointment time.   If you have a lab appointment with the [facility] please come in thru the Main Entrance and check in at the main information desk.  You need to re-schedule your appointment should you arrive 10 or more minutes late.  We strive to give you quality time with our providers, and arriving late affects you and other patients whose appointments are after yours.  Also, if you no show three or more times for appointments you may be dismissed from the clinic at the providers discretion.     Again, thank you for choosing [facility].  Our hope is that these requests will decrease the amount of time that you wait before being seen by our physicians.       _____________________________________________________________  Should you have questions after your visit to [facility], please contact our office at [phone] and follow the prompts.  Our office hours are 8:00 a.m. and 4:30 p.m. Monday - Friday.  Please note that voicemails left after 4:00 p.m. may  not be returned until the following business day.  We are closed weekends and major holidays.  You do have access to a nurse 24-7, just call the main number to the clinic [phone] and do not press any options, hold on the line and a nurse will answer the phone.    For prescription refill requests, have your pharmacy contact our office and allow 72 hours.    Due to Covid, you will need to wear a mask upon entering the hospital. If you do not have a mask, a mask will be given to you at the Main Entrance upon arrival. For doctor visits, patients may have 1 support person age 21 or older with them. For treatment visits, patients can not have anyone with them due to social distancing guidelines and our immunocompromised population.

## 2021-02-07 NOTE — Progress Notes (Signed)
Patient presents today for Xgeva injection per providers order.  Vital signs and labs within parameters for injection.  Patient is taking Calcium and Vitamin D supplements, has had no jaw pain or dental work.  Message received from Adonis Huguenin RN/Dr. Delton Coombes, patient okay for Delton See.  Stable during Xgeva administration without incident; injection site WNL; see MAR for injection details.  Patient tolerated procedure well and without incident.  No questions or complaints noted at this time.  Discharge from clinic ambulatory in stable condition.  Alert and oriented X 3.  Follow up with [facility] as scheduled.

## 2021-02-07 NOTE — Progress Notes (Signed)
Patient has been seen and assessed by Dr. Delton Coombes. Labs reviewed by Dr. Delton Coombes. Okay to proceed with Delton See today per Dr. Delton Coombes. Primary RN and Pharmacy made aware.

## 2021-02-08 ENCOUNTER — Encounter (HOSPITAL_COMMUNITY): Payer: Self-pay | Admitting: Hematology

## 2021-02-13 ENCOUNTER — Other Ambulatory Visit (HOSPITAL_COMMUNITY): Payer: Self-pay

## 2021-02-15 ENCOUNTER — Other Ambulatory Visit (HOSPITAL_COMMUNITY): Payer: Self-pay

## 2021-02-15 MED FILL — Prednisone Tab 5 MG: ORAL | 30 days supply | Qty: 30 | Fill #5 | Status: AC

## 2021-02-21 ENCOUNTER — Other Ambulatory Visit (HOSPITAL_COMMUNITY): Payer: Self-pay

## 2021-03-07 ENCOUNTER — Encounter (HOSPITAL_COMMUNITY): Payer: Self-pay

## 2021-03-07 ENCOUNTER — Inpatient Hospital Stay (HOSPITAL_COMMUNITY): Payer: Medicaid Other | Attending: Hematology

## 2021-03-07 ENCOUNTER — Other Ambulatory Visit: Payer: Self-pay

## 2021-03-07 ENCOUNTER — Inpatient Hospital Stay (HOSPITAL_COMMUNITY): Payer: Medicaid Other

## 2021-03-07 VITALS — BP 162/91 | HR 61 | Temp 96.4°F | Resp 18

## 2021-03-07 DIAGNOSIS — C7951 Secondary malignant neoplasm of bone: Secondary | ICD-10-CM | POA: Diagnosis not present

## 2021-03-07 DIAGNOSIS — C61 Malignant neoplasm of prostate: Secondary | ICD-10-CM

## 2021-03-07 DIAGNOSIS — D631 Anemia in chronic kidney disease: Secondary | ICD-10-CM | POA: Insufficient documentation

## 2021-03-07 DIAGNOSIS — N189 Chronic kidney disease, unspecified: Secondary | ICD-10-CM | POA: Insufficient documentation

## 2021-03-07 LAB — COMPREHENSIVE METABOLIC PANEL
ALT: 15 U/L (ref 0–44)
AST: 15 U/L (ref 15–41)
Albumin: 4.2 g/dL (ref 3.5–5.0)
Alkaline Phosphatase: 46 U/L (ref 38–126)
Anion gap: 8 (ref 5–15)
BUN: 22 mg/dL (ref 8–23)
CO2: 25 mmol/L (ref 22–32)
Calcium: 9.3 mg/dL (ref 8.9–10.3)
Chloride: 106 mmol/L (ref 98–111)
Creatinine, Ser: 1.45 mg/dL — ABNORMAL HIGH (ref 0.61–1.24)
GFR, Estimated: 48 mL/min — ABNORMAL LOW (ref >60)
Glucose, Bld: 94 mg/dL (ref 70–99)
Potassium: 3.7 mmol/L (ref 3.5–5.1)
Sodium: 139 mmol/L (ref 135–145)
Total Bilirubin: 0.9 mg/dL (ref 0.3–1.2)
Total Protein: 7.3 g/dL (ref 6.5–8.1)

## 2021-03-07 LAB — CBC WITH DIFFERENTIAL/PLATELET
Abs Immature Granulocytes: 0.01 10*3/uL (ref 0.00–0.07)
Basophils Absolute: 0 10*3/uL (ref 0.0–0.1)
Basophils Relative: 1 %
Eosinophils Absolute: 0 10*3/uL (ref 0.0–0.5)
Eosinophils Relative: 1 %
HCT: 37.6 % — ABNORMAL LOW (ref 39.0–52.0)
Hemoglobin: 12.6 g/dL — ABNORMAL LOW (ref 13.0–17.0)
Immature Granulocytes: 0 %
Lymphocytes Relative: 54 %
Lymphs Abs: 2.9 10*3/uL (ref 0.7–4.0)
MCH: 31.8 pg (ref 26.0–34.0)
MCHC: 33.5 g/dL (ref 30.0–36.0)
MCV: 94.9 fL (ref 80.0–100.0)
Monocytes Absolute: 0.4 10*3/uL (ref 0.1–1.0)
Monocytes Relative: 7 %
Neutro Abs: 1.9 10*3/uL (ref 1.7–7.7)
Neutrophils Relative %: 37 %
Platelets: 201 10*3/uL (ref 150–400)
RBC: 3.96 MIL/uL — ABNORMAL LOW (ref 4.22–5.81)
RDW: 13.2 % (ref 11.5–15.5)
WBC: 5.3 10*3/uL (ref 4.0–10.5)
nRBC: 0 % (ref 0.0–0.2)

## 2021-03-07 LAB — FOLATE: Folate: 20.2 ng/mL (ref >5.9)

## 2021-03-07 LAB — IRON AND TIBC
Iron: 84 ug/dL (ref 45–182)
Saturation Ratios: 25 % (ref 17.9–39.5)
TIBC: 335 ug/dL (ref 250–450)
UIBC: 251 ug/dL

## 2021-03-07 LAB — FERRITIN: Ferritin: 202 ng/mL (ref 24–336)

## 2021-03-07 LAB — VITAMIN B12: Vitamin B-12: 890 pg/mL (ref 180–914)

## 2021-03-07 LAB — MAGNESIUM: Magnesium: 2.3 mg/dL (ref 1.7–2.4)

## 2021-03-07 MED ORDER — DENOSUMAB 120 MG/1.7ML ~~LOC~~ SOLN
120.0000 mg | Freq: Once | SUBCUTANEOUS | Status: AC
  Administered 2021-03-07: 120 mg via SUBCUTANEOUS
  Filled 2021-03-07: qty 1.7

## 2021-03-07 NOTE — Patient Instructions (Signed)
[facility]  Discharge Instructions: Thank you for choosing [facility] to provide your oncology and hematology care.  If you have a lab appointment with the [facility], please come in thru the Main Entrance and check in at the main information desk.  Wear comfortable clothing and clothing appropriate for easy access to any Portacath or PICC line.   We strive to give you quality time with your provider. You may need to reschedule your appointment if you arrive late (15 or more minutes).  Arriving late affects you and other patients whose appointments are after yours.  Also, if you miss three or more appointments without notifying the office, you may be dismissed from the clinic at the provider's discretion.      For prescription refill requests, have your pharmacy contact our office and allow 72 hours for refills to be completed.    Today you received the following chemotherapy and/or immunotherapy agents xgeva, calcium level 9.3. Return as scheduled.       To help prevent nausea and vomiting after your treatment, we encourage you to take your nausea medication as directed.  BELOW ARE SYMPTOMS THAT SHOULD BE REPORTED IMMEDIATELY: *FEVER GREATER THAN 100.4 F (38 C) OR HIGHER *CHILLS OR SWEATING *NAUSEA AND VOMITING THAT IS NOT CONTROLLED WITH YOUR NAUSEA MEDICATION *UNUSUAL SHORTNESS OF BREATH *UNUSUAL BRUISING OR BLEEDING *URINARY PROBLEMS (pain or burning when urinating, or frequent urination) *BOWEL PROBLEMS (unusual diarrhea, constipation, pain near the anus) TENDERNESS IN MOUTH AND THROAT WITH OR WITHOUT PRESENCE OF ULCERS (sore throat, sores in mouth, or a toothache) UNUSUAL RASH, SWELLING OR PAIN  UNUSUAL VAGINAL DISCHARGE OR ITCHING   Items with * indicate a potential emergency and should be followed up as soon as possible or go to the Emergency Department if any problems should occur.  Please show the CHEMOTHERAPY ALERT CARD or IMMUNOTHERAPY ALERT  CARD at check-in to the Emergency Department and triage nurse.  Should you have questions after your visit or need to cancel or reschedule your appointment, please contact [facility] [phone]  and follow the prompts.  Office hours are 8:00 a.m. to 4:30 p.m. Monday - Friday. Please note that voicemails left after 4:00 p.m. may not be returned until the following business day.  We are closed weekends and major holidays. You have access to a nurse at all times for urgent questions. Please call the main number to the clinic [phone] and follow the prompts.  For any non-urgent questions, you may also contact your provider using MyChart. We now offer e-Visits for anyone 69 and older to request care online for non-urgent symptoms. For details visit [email or web address].   Also download the MyChart app! Go to the app store, search "MyChart", open the app, select [facility], and log in with your MyChart username and password.  Due to Covid, a mask is required upon entering the hospital/clinic. If you do not have a mask, one will be given to you upon arrival. For doctor visits, patients may have 1 support person aged 20 or older with them. For treatment visits, patients cannot have anyone with them due to current Covid guidelines and our immunocompromised population.  Denosumab injection What is this medication? DENOSUMAB (den oh sue mab) slows bone breakdown. Prolia is used to treat osteoporosis in women after menopause and in men, and in people who are taking corticosteroids for 6 months or more. Delton See is used to treat a high calcium level due to cancer and  to prevent bone fractures and other bone problems caused by multiple myeloma or cancer bone metastases. Delton See is also used to treat giant cell tumor of the bone. This medicine may be used for other purposes; ask your health care provider or pharmacist if you have questions. COMMON BRAND NAME(S): Prolia, XGEVA What should I tell my  care team before I take this medication? They need to know if you have any of these conditions: dental disease having surgery or tooth extraction infection kidney disease low levels of calcium or Vitamin D in the blood malnutrition on hemodialysis skin conditions or sensitivity thyroid or parathyroid disease an unusual reaction to denosumab, other medicines, foods, dyes, or preservatives pregnant or trying to get pregnant breast-feeding How should I use this medication? This medicine is for injection under the skin. It is given by a health care professional in a hospital or clinic setting. A special MedGuide will be given to you before each treatment. Be sure to read this information carefully each time. For Prolia, talk to your pediatrician regarding the use of this medicine in children. Special care may be needed. For Delton See, talk to your pediatrician regarding the use of this medicine in children. While this drug may be prescribed for children as young as 13 years for selected conditions, precautions do apply. Overdosage: If you think you have taken too much of this medicine contact a poison control center or emergency room at once. NOTE: This medicine is only for you. Do not share this medicine with others. What if I miss a dose? It is important not to miss your dose. Call your doctor or health care professional if you are unable to keep an appointment. What may interact with this medication? Do not take this medicine with any of the following medications: other medicines containing denosumab This medicine may also interact with the following medications: medicines that lower your chance of fighting infection steroid medicines like prednisone or cortisone This list may not describe all possible interactions. Give your health care provider a list of all the medicines, herbs, non-prescription drugs, or dietary supplements you use. Also tell them if you smoke, drink alcohol, or use  illegal drugs. Some items may interact with your medicine. What should I watch for while using this medication? Visit your doctor or health care professional for regular checks on your progress. Your doctor or health care professional may order blood tests and other tests to see how you are doing. Call your doctor or health care professional for advice if you get a fever, chills or sore throat, or other symptoms of a cold or flu. Do not treat yourself. This drug may decrease your body's ability to fight infection. Try to avoid being around people who are sick. You should make sure you get enough calcium and vitamin D while you are taking this medicine, unless your doctor tells you not to. Discuss the foods you eat and the vitamins you take with your health care professional. See your dentist regularly. Brush and floss your teeth as directed. Before you have any dental work done, tell your dentist you are receiving this medicine. Do not become pregnant while taking this medicine or for 5 months after stopping it. Talk with your doctor or health care professional about your birth control options while taking this medicine. Women should inform their doctor if they wish to become pregnant or think they might be pregnant. There is a potential for serious side effects to an unborn child. Talk to your  health care professional or pharmacist for more information. What side effects may I notice from receiving this medication? Side effects that you should report to your doctor or health care professional as soon as possible: allergic reactions like skin rash, itching or hives, swelling of the face, lips, or tongue bone pain breathing problems dizziness jaw pain, especially after dental work redness, blistering, peeling of the skin signs and symptoms of infection like fever or chills; cough; sore throat; pain or trouble passing urine signs of low calcium like fast heartbeat, muscle cramps or muscle pain; pain,  tingling, numbness in the hands or feet; seizures unusual bleeding or bruising unusually weak or tired Side effects that usually do not require medical attention (report to your doctor or health care professional if they continue or are bothersome): constipation diarrhea headache joint pain loss of appetite muscle pain runny nose tiredness upset stomach This list may not describe all possible side effects. Call your doctor for medical advice about side effects. You may report side effects to FDA at 1-800-FDA-1088. Where should I keep my medication? This medicine is only given in a clinic, doctor's office, or other health care setting and will not be stored at home. NOTE: This sheet is a summary. It may not cover all possible information. If you have questions about this medicine, talk to your doctor, pharmacist, or health care provider.  2022 Elsevier/Gold Standard (2017-08-16 00:00:00)

## 2021-03-07 NOTE — Progress Notes (Signed)
Pt here for his monthly x-geva.  Last dose given 02/07/21.  Calcium 9.3.  No complaints or pain to note.   Carlos Harper presents today for injection per the provider's orders.  X-geva in abdomen subcutaneous administration without incident; injection site WNL; see MAR for injection details.  Patient tolerated procedure well and without incident.  No questions or complaints noted at this time. Stable during and aftger injection.  Vital signs stable. AVS reviewed.  Discharged in stable condition ambulatory.

## 2021-03-08 LAB — PSA: Prostatic Specific Antigen: 0.24 ng/mL (ref 0.00–4.00)

## 2021-03-13 ENCOUNTER — Other Ambulatory Visit (HOSPITAL_COMMUNITY): Payer: Self-pay

## 2021-03-15 ENCOUNTER — Other Ambulatory Visit (HOSPITAL_COMMUNITY): Payer: Self-pay | Admitting: Hematology

## 2021-03-15 ENCOUNTER — Other Ambulatory Visit (HOSPITAL_COMMUNITY): Payer: Self-pay

## 2021-03-15 DIAGNOSIS — C61 Malignant neoplasm of prostate: Secondary | ICD-10-CM

## 2021-03-15 MED ORDER — PREDNISONE 5 MG PO TABS
ORAL_TABLET | Freq: Every day | ORAL | 6 refills | Status: DC
  Filled 2021-03-15: qty 30, 30d supply, fill #0
  Filled 2021-04-13: qty 30, 30d supply, fill #1
  Filled 2021-05-19: qty 30, 30d supply, fill #2
  Filled 2021-06-16: qty 30, 30d supply, fill #3
  Filled 2021-07-17: qty 30, 30d supply, fill #4
  Filled 2021-08-23: qty 30, 30d supply, fill #5
  Filled 2021-09-19: qty 30, 30d supply, fill #6

## 2021-03-17 ENCOUNTER — Other Ambulatory Visit (HOSPITAL_COMMUNITY): Payer: Self-pay

## 2021-03-28 ENCOUNTER — Other Ambulatory Visit (HOSPITAL_COMMUNITY): Payer: Self-pay

## 2021-04-04 ENCOUNTER — Other Ambulatory Visit (HOSPITAL_COMMUNITY): Payer: Medicaid Other

## 2021-04-04 ENCOUNTER — Ambulatory Visit (HOSPITAL_COMMUNITY): Payer: Medicaid Other | Admitting: Hematology

## 2021-04-04 ENCOUNTER — Encounter (HOSPITAL_COMMUNITY): Payer: Self-pay

## 2021-04-04 ENCOUNTER — Inpatient Hospital Stay (HOSPITAL_COMMUNITY): Payer: Medicaid Other

## 2021-04-04 ENCOUNTER — Other Ambulatory Visit (HOSPITAL_COMMUNITY): Payer: Self-pay

## 2021-04-04 ENCOUNTER — Inpatient Hospital Stay (HOSPITAL_COMMUNITY): Payer: Medicaid Other | Attending: Hematology

## 2021-04-04 ENCOUNTER — Ambulatory Visit (HOSPITAL_COMMUNITY): Payer: Medicaid Other

## 2021-04-04 ENCOUNTER — Other Ambulatory Visit: Payer: Self-pay

## 2021-04-04 VITALS — BP 139/75 | HR 60 | Temp 96.4°F | Resp 18

## 2021-04-04 DIAGNOSIS — D631 Anemia in chronic kidney disease: Secondary | ICD-10-CM | POA: Insufficient documentation

## 2021-04-04 DIAGNOSIS — D500 Iron deficiency anemia secondary to blood loss (chronic): Secondary | ICD-10-CM

## 2021-04-04 DIAGNOSIS — C7951 Secondary malignant neoplasm of bone: Secondary | ICD-10-CM | POA: Insufficient documentation

## 2021-04-04 DIAGNOSIS — G629 Polyneuropathy, unspecified: Secondary | ICD-10-CM | POA: Insufficient documentation

## 2021-04-04 DIAGNOSIS — I129 Hypertensive chronic kidney disease with stage 1 through stage 4 chronic kidney disease, or unspecified chronic kidney disease: Secondary | ICD-10-CM | POA: Diagnosis not present

## 2021-04-04 DIAGNOSIS — C61 Malignant neoplasm of prostate: Secondary | ICD-10-CM

## 2021-04-04 DIAGNOSIS — D573 Sickle-cell trait: Secondary | ICD-10-CM | POA: Diagnosis not present

## 2021-04-04 DIAGNOSIS — D508 Other iron deficiency anemias: Secondary | ICD-10-CM

## 2021-04-04 DIAGNOSIS — N189 Chronic kidney disease, unspecified: Secondary | ICD-10-CM | POA: Insufficient documentation

## 2021-04-04 DIAGNOSIS — E538 Deficiency of other specified B group vitamins: Secondary | ICD-10-CM

## 2021-04-04 LAB — COMPREHENSIVE METABOLIC PANEL
ALT: 12 U/L (ref 0–44)
AST: 16 U/L (ref 15–41)
Albumin: 3.9 g/dL (ref 3.5–5.0)
Alkaline Phosphatase: 40 U/L (ref 38–126)
Anion gap: 9 (ref 5–15)
BUN: 24 mg/dL — ABNORMAL HIGH (ref 8–23)
CO2: 23 mmol/L (ref 22–32)
Calcium: 9.1 mg/dL (ref 8.9–10.3)
Chloride: 105 mmol/L (ref 98–111)
Creatinine, Ser: 1.56 mg/dL — ABNORMAL HIGH (ref 0.61–1.24)
GFR, Estimated: 44 mL/min — ABNORMAL LOW (ref >60)
Glucose, Bld: 103 mg/dL — ABNORMAL HIGH (ref 70–99)
Potassium: 3.7 mmol/L (ref 3.5–5.1)
Sodium: 137 mmol/L (ref 135–145)
Total Bilirubin: 1 mg/dL (ref 0.3–1.2)
Total Protein: 6.9 g/dL (ref 6.5–8.1)

## 2021-04-04 LAB — CBC WITH DIFFERENTIAL/PLATELET
Abs Immature Granulocytes: 0.01 10*3/uL (ref 0.00–0.07)
Basophils Absolute: 0 10*3/uL (ref 0.0–0.1)
Basophils Relative: 1 %
Eosinophils Absolute: 0 10*3/uL (ref 0.0–0.5)
Eosinophils Relative: 1 %
HCT: 34.3 % — ABNORMAL LOW (ref 39.0–52.0)
Hemoglobin: 11.4 g/dL — ABNORMAL LOW (ref 13.0–17.0)
Immature Granulocytes: 0 %
Lymphocytes Relative: 49 %
Lymphs Abs: 2.1 10*3/uL (ref 0.7–4.0)
MCH: 31.4 pg (ref 26.0–34.0)
MCHC: 33.2 g/dL (ref 30.0–36.0)
MCV: 94.5 fL (ref 80.0–100.0)
Monocytes Absolute: 0.3 10*3/uL (ref 0.1–1.0)
Monocytes Relative: 8 %
Neutro Abs: 1.7 10*3/uL (ref 1.7–7.7)
Neutrophils Relative %: 41 %
Platelets: 200 10*3/uL (ref 150–400)
RBC: 3.63 MIL/uL — ABNORMAL LOW (ref 4.22–5.81)
RDW: 12.9 % (ref 11.5–15.5)
WBC: 4.1 10*3/uL (ref 4.0–10.5)
nRBC: 0 % (ref 0.0–0.2)

## 2021-04-04 LAB — PSA: Prostatic Specific Antigen: 0.23 ng/mL (ref 0.00–4.00)

## 2021-04-04 LAB — MAGNESIUM: Magnesium: 2.1 mg/dL (ref 1.7–2.4)

## 2021-04-04 LAB — IRON AND TIBC
Iron: 71 ug/dL (ref 45–182)
Saturation Ratios: 22 % (ref 17.9–39.5)
TIBC: 323 ug/dL (ref 250–450)
UIBC: 252 ug/dL

## 2021-04-04 LAB — VITAMIN B12: Vitamin B-12: 728 pg/mL (ref 180–914)

## 2021-04-04 LAB — FOLATE: Folate: 20 ng/mL (ref >5.9)

## 2021-04-04 LAB — FERRITIN: Ferritin: 216 ng/mL (ref 24–336)

## 2021-04-04 MED ORDER — DENOSUMAB 120 MG/1.7ML ~~LOC~~ SOLN
120.0000 mg | Freq: Once | SUBCUTANEOUS | Status: AC
  Administered 2021-04-04: 120 mg via SUBCUTANEOUS
  Filled 2021-04-04: qty 1.7

## 2021-04-04 NOTE — Patient Instructions (Signed)
[facility]  Discharge Instructions: Thank you for choosing [facility] to provide your oncology and hematology care.  If you have a lab appointment with the Cancer Center, please come in thru the Main Entrance and check in at the main information desk.  Wear comfortable clothing and clothing appropriate for easy access to any Portacath or PICC line.   We strive to give you quality time with your provider. You may need to reschedule your appointment if you arrive late (15 or more minutes).  Arriving late affects you and other patients whose appointments are after yours.  Also, if you miss three or more appointments without notifying the office, you may be dismissed from the clinic at the provider's discretion.      For prescription refill requests, have your pharmacy contact our office and allow 72 hours for refills to be completed.    Today you received the following chemotherapy and/or immunotherapy agents xgeva.       To help prevent nausea and vomiting after your treatment, we encourage you to take your nausea medication as directed.  BELOW ARE SYMPTOMS THAT SHOULD BE REPORTED IMMEDIATELY: *FEVER GREATER THAN 100.4 F (38 C) OR HIGHER *CHILLS OR SWEATING *NAUSEA AND VOMITING THAT IS NOT CONTROLLED WITH YOUR NAUSEA MEDICATION *UNUSUAL SHORTNESS OF BREATH *UNUSUAL BRUISING OR BLEEDING *URINARY PROBLEMS (pain or burning when urinating, or frequent urination) *BOWEL PROBLEMS (unusual diarrhea, constipation, pain near the anus) TENDERNESS IN MOUTH AND THROAT WITH OR WITHOUT PRESENCE OF ULCERS (sore throat, sores in mouth, or a toothache) UNUSUAL RASH, SWELLING OR PAIN  UNUSUAL VAGINAL DISCHARGE OR ITCHING   Items with * indicate a potential emergency and should be followed up as soon as possible or go to the Emergency Department if any problems should occur.  Please show the CHEMOTHERAPY ALERT CARD or IMMUNOTHERAPY ALERT CARD at check-in to the Emergency  Department and triage nurse.  Should you have questions after your visit or need to cancel or reschedule your appointment, please contact [facility] [phone]  and follow the prompts.  Office hours are 8:00 a.m. to 4:30 p.m. Monday - Friday. Please note that voicemails left after 4:00 p.m. may not be returned until the following business day.  We are closed weekends and major holidays. You have access to a nurse at all times for urgent questions. Please call the main number to the clinic [phone] and follow the prompts.  For any non-urgent questions, you may also contact your provider using MyChart. We now offer e-Visits for anyone 18 and older to request care online for non-urgent symptoms. For details visit [email or web address].   Also download the MyChart app! Go to the app store, search "MyChart", open the app, select [facility], and log in with your MyChart username and password.  Due to Covid, a mask is required upon entering the hospital/clinic. If you do not have a mask, one will be given to you upon arrival. For doctor visits, patients may have 1 support person aged 18 or older with them. For treatment visits, patients cannot have anyone with them due to current Covid guidelines and our immunocompromised population.  

## 2021-04-04 NOTE — Progress Notes (Signed)
Patient drinking orange juice as directed to meet the calcium requirements.  Denied tooth, jaw, and leg pain.  No recent or upcoming dental visits.  Labs reviewed.  Patient tolerated injection with no complaints voiced.  See MAR for details.  Patient stable during and after injection.  Site clean and dry with no bruising or swelling noted.  Band aid applied.  Vss with discharge and left in satisfactory condition with no s/s of distress noted.

## 2021-04-10 NOTE — Progress Notes (Signed)
Carlos Harper, [place] [address]   CLINIC:  Medical Oncology/Hematology  PCP:  Raiford Simmonds., PA-C [place] / [place] [address] [phone]   REASON FOR VISIT:  Follow-up for metastatic prostate cancer  PRIOR THERAPY:  1. Bilateral orchiectomy on 09/17/2017. 2. Docetaxel x 6 cycles from 11/19/2017 to 03/05/2018  NGS Results: not done  CURRENT THERAPY: Zytiga 1,000 mg daily; Xgeva monthly  BRIEF ONCOLOGIC HISTORY:  Oncology History  Malignant neoplasm of prostate ([facility])  09/27/2017 Initial Diagnosis   Malignant neoplasm of prostate ([facility])   11/19/2017 - 03/07/2018 Chemotherapy   The patient had pegfilgrastim (NEULASTA) injection 6 mg, 6 mg, Subcutaneous, Once, 6 of 6 cycles Administration: 6 mg (11/21/2017), 6 mg (12/12/2017), 6 mg (01/02/2018), 6 mg (01/24/2018), 6 mg (02/14/2018), 6 mg (03/07/2018) pegfilgrastim (NEULASTA ONPRO KIT) injection 6 mg, 6 mg, Subcutaneous, Once, 2 of 2 cycles DOCEtaxel (TAXOTERE) 140 mg in sodium chloride 0.9 % 250 mL chemo infusion, 75 mg/m2 = 140 mg, Intravenous,  Once, 6 of 6 cycles Administration: 140 mg (11/19/2017), 140 mg (12/10/2017), 140 mg (01/22/2018), 140 mg (12/31/2017), 140 mg (02/12/2018), 140 mg (03/05/2018) ondansetron (ZOFRAN) 8 mg, dexamethasone (DECADRON) 10 mg in sodium chloride 0.9 % 50 mL IVPB, , Intravenous,  Once, 4 of 4 cycles Administration:  (12/31/2017),  (01/22/2018),  (02/12/2018),  (03/05/2018)   for chemotherapy treatment.       CANCER STAGING:  Cancer Staging  No matching staging information was found for the patient.  INTERVAL HISTORY:  [name], a 80 y.o. male, returns for routine follow-up of his metastatic prostate cancer. Paul was last seen on 02/07/2021.   Today he reports feeling good. He denies n/v/d. His energy levels are good. He denies any new pains or jaw pains. He reports occasional swelling in his feet in the evenings. His appetite is  good.   REVIEW OF SYSTEMS:  Review of Systems  Constitutional:  Negative for appetite change and fatigue.  HENT:   Positive for trouble swallowing (teeth).   Gastrointestinal:  Negative for diarrhea, nausea and vomiting.  Musculoskeletal:  Negative for arthralgias and myalgias.  Neurological:  Positive for numbness.  All other systems reviewed and are negative.  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 ([facility]) 09/27/2017   Sickle cell trait ([facility])    Vertigo    Past Surgical History:  Procedure Laterality Date   CYSTOSCOPY WITH FULGERATION  09/17/2017   Procedure: CYSTOSCOPY WITH FULGERATION OF BLADDER NECK;  Surgeon: [name], MD;  Location: [facility];  Service: Urology;;   CYSTOSCOPY WITH STENT PLACEMENT Right 09/17/2017   Procedure: CYSTOSCOPY WITH RIGHT RETROGRADE PYELOGRAM ATTEMPTED STENT PLACEMENT;  Surgeon: [name], MD;  Location: [facility];  Service: Urology;  Laterality: Right;   left leg surgery due to [place] Bilateral 09/17/2017   Procedure: ORCHIECTOMY;  Surgeon: [name], MD;  Location: [facility];  Service: Urology;  Laterality: Bilateral;   PORTACATH PLACEMENT Left 10/14/2017   Procedure: INSERTION PORT-A-CATH;  Surgeon: Aviva Signs, MD;  Location: [facility];  Service: General;  Laterality: Left;   PROSTATE BIOPSY N/A 09/17/2017   Procedure: BIOPSY TRANSRECTAL ULTRASONIC PROSTATE (TUBP);  Surgeon: [name], MD;  Location: [facility];  Service: Urology;  Laterality: N/A;    SOCIAL HISTORY:  Social History   Socioeconomic  History   Marital status: Single    Spouse name: Not on file   Number of children: Not on file   Years of education: Not on file   Highest education level: Not on file  Occupational History   Not on file  Tobacco Use   Smoking status: Never   Smokeless tobacco: Never  Vaping Use   Vaping Use: Never used  Substance and Sexual  Activity   Alcohol use: Yes    Comment: Drinks beer occasionally    Drug use: No   Sexual activity: Not Currently  Other Topics Concern   Not on file  Social History Narrative   Not on file   Social Determinants of Health   Financial Resource Strain: Not on file  Food Insecurity: Not on file  Transportation Needs: Not on file  Physical Activity: Not on file  Stress: Not on file  Social Connections: Not on file  Intimate Partner Violence: Not on file    FAMILY HISTORY:  Family History  Problem Relation Age of Onset   Hypertension Mother    Lung cancer Father    Lung cancer Brother    HIV/AIDS Brother    Breast cancer Daughter     CURRENT MEDICATIONS:  Current Outpatient Medications  Medication Sig Dispense Refill   abiraterone acetate (ZYTIGA) 250 MG tablet TAKE 4 TABLETS (1,000 MG TOTAL) BY MOUTH DAILY. TAKE ON AN EMPTY STOMACH 1 HOUR BEFORE OR 2 HOURS AFTER A MEAL 120 tablet 3   amLODipine (NORVASC) 5 MG tablet Take 1 tablet (5 mg total) by mouth daily. 30 tablet 1   docusate sodium (COLACE) 100 MG capsule Take 100 mg by mouth daily.     dronabinol (MARINOL) 5 MG capsule Take 1 capsule (5 mg total) by mouth 2 (two) times daily before a meal. 60 capsule 1   gabapentin (NEURONTIN) 300 MG capsule Take 1 capsule (300 mg total) by mouth at bedtime. 30 capsule 3   Misc. Devices MISC Please provide patient with a rollaider that has a seat. 1 each 0   predniSONE (DELTASONE) 5 MG tablet TAKE 1 TABLET BY MOUTH ONCE A DAY WITH BREAKFAST 30 tablet 6   No current facility-administered medications for this visit.    ALLERGIES:  No Known Allergies  PHYSICAL EXAM:  Performance status (ECOG): 1 - Symptomatic but completely ambulatory  Vitals:   04/11/21 1100  BP: (!) 147/84  Pulse: 70  Resp: 18  Temp: (!) 97.1 F (36.2 C)  SpO2: 98%   Wt Readings from Last 3 Encounters:  04/11/21 184 lb 14.4 oz (83.9 kg)  02/07/21 180 lb (81.6 kg)  01/10/21 179 lb 6.4 oz (81.4 kg)    Physical Exam Vitals reviewed.  Constitutional:      Appearance: Normal appearance.  Cardiovascular:     Rate and Rhythm: Normal rate and regular rhythm.     Pulses: Normal pulses.     Heart sounds: Normal heart sounds.  Pulmonary:     Effort: Pulmonary effort is normal.     Breath sounds: Normal breath sounds.  Neurological:     General: No focal deficit present.     Mental Status: He is alert and oriented to person, place, and time.  Psychiatric:        Mood and Affect: Mood normal.        Behavior: Behavior normal.     LABORATORY DATA:  I have reviewed the labs as listed.  CBC Latest Ref Rng & Units 04/04/2021  03/07/2021 02/06/2021  WBC 4.0 - 10.5 K/uL 4.1 5.3 5.0  Hemoglobin 13.0 - 17.0 g/dL 11.4(L) 12.6(L) 11.7(L)  Hematocrit 39.0 - 52.0 % 34.3(L) 37.6(L) 35.1(L)  Platelets 150 - 400 K/uL 200 201 232   CMP Latest Ref Rng & Units 04/04/2021 03/07/2021 02/06/2021  Glucose 70 - 99 mg/dL 103(H) 94 97  BUN 8 - 23 mg/dL 24(H) 22 20  Creatinine 0.61 - 1.24 mg/dL 1.56(H) 1.45(H) 1.53(H)  Sodium 135 - 145 mmol/L 137 139 139  Potassium 3.5 - 5.1 mmol/L 3.7 3.7 3.7  Chloride 98 - 111 mmol/L 105 106 107  CO2 22 - 32 mmol/L _0 Calcium 8.9 - 10.3 mg/dL 9.1 9.3 9.2  Total Protein 6.5 - 8.1 g/dL 6.9 7.3 6.9  Total Bilirubin 0.3 - 1.2 mg/dL 1.0 0.9 1.0  Alkaline Phos 38 - 126 U/L 40 46 48  AST 15 - 41 U/L _1 ALT 0 - 44 U/L _2 DIAGNOSTIC IMAGING:  I have independently reviewed the scans and discussed with the patient. No results found.   ASSESSMENT:  1.  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 -6 cycles of docetaxel from 11/19/2017 through 03/05/2018. -Abiraterone and prednisone started on 05/07/2018.  PSA was 4.52.   2.  Bone metastasis: -Denosumab started on 07/21/2018.   3.  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  Last Feraheme on 01/22/2018.    4.  CKD: -Baseline creatinine between 1.5-1.6.   PLAN:  1.  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 - PSA is stable at 0.23.  LFTs are normal. - Recommend continue Abiraterone 1000 mg daily along with prednisone 5 mg daily. - RTC 2 months with repeat PSA and labs.   2.  Bone metastasis: - Continue calcium supplement.  Calcium is normal. - Continue monthly denosumab.   3.  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 - Ferritin is 216, percent saturation 22.  O06 and folic acid is normal. - Hemoglobin today is 11.4.  If any worsening, will consider parenteral iron therapy. - We will plan to repeat ferritin and iron panel in 2 months.   4.  Peripheral neuropathy: - He has neuropathy pains in the feet at nighttime. - Continue gabapentin 300 mg at bedtime.   5.  CKD: - Baseline creatinine 1.4-1.6.  Today creatinine 1.56.  6.  Hypertension: - Continue Norvasc 5 mg daily.  Blood pressure improved to 147/84.   Orders placed this encounter:  No orders of the defined types were placed in this encounter.    Derek Jack, MD [facility] [phone]   I, Thana Ates, am acting as a scribe for Dr. Derek Jack.  I, Derek Jack MD, have reviewed the above documentation for accuracy and completeness, and I agree with the above.

## 2021-04-11 ENCOUNTER — Other Ambulatory Visit: Payer: Self-pay

## 2021-04-11 ENCOUNTER — Inpatient Hospital Stay (HOSPITAL_BASED_OUTPATIENT_CLINIC_OR_DEPARTMENT_OTHER): Payer: Medicaid Other | Admitting: Hematology

## 2021-04-11 ENCOUNTER — Other Ambulatory Visit (HOSPITAL_COMMUNITY): Payer: Self-pay | Admitting: *Deleted

## 2021-04-11 VITALS — BP 147/84 | HR 70 | Temp 97.1°F | Resp 18 | Wt 184.9 lb

## 2021-04-11 DIAGNOSIS — C61 Malignant neoplasm of prostate: Secondary | ICD-10-CM | POA: Diagnosis not present

## 2021-04-11 DIAGNOSIS — D500 Iron deficiency anemia secondary to blood loss (chronic): Secondary | ICD-10-CM

## 2021-04-11 NOTE — Patient Instructions (Signed)
[place] at [facility] Discharge Instructions   You were seen and examined today by Dr. Delton Coombes. Increase your water intake to help with your kidney function. Continue Zytiga as prescribed.  Return as scheduled in 2 months for lab work, office visit, and injection.    Thank you for choosing [place] at [facility] to provide your oncology and hematology care.  To afford each patient quality time with our provider, please arrive at least 15 minutes before your scheduled appointment time.   If you have a lab appointment with the [facility] please come in thru the Main Entrance and check in at the main information desk.  You need to re-schedule your appointment should you arrive 10 or more minutes late.  We strive to give you quality time with our providers, and arriving late affects you and other patients whose appointments are after yours.  Also, if you no show three or more times for appointments you may be dismissed from the clinic at the providers discretion.     Again, thank you for choosing [facility].  Our hope is that these requests will decrease the amount of time that you wait before being seen by our physicians.       _____________________________________________________________  Should you have questions after your visit to [facility], please contact our office at [phone] and follow the prompts.  Our office hours are 8:00 a.m. and 4:30 p.m. Monday - Friday.  Please note that voicemails left after 4:00 p.m. may not be returned until the following business day.  We are closed weekends and major holidays.  You do have access to a nurse 24-7, just call the main number to the clinic [phone] and do not press any options, hold on the line and a nurse will answer the phone.    For prescription refill requests, have your pharmacy contact our office and allow 72 hours.    Due to Covid, you will need  to wear a mask upon entering the hospital. If you do not have a mask, a mask will be given to you at the Main Entrance upon arrival. For doctor visits, patients may have 1 support person age 34 or older with them. For treatment visits, patients can not have anyone with them due to social distancing guidelines and our immunocompromised population.

## 2021-04-13 ENCOUNTER — Other Ambulatory Visit (HOSPITAL_COMMUNITY): Payer: Self-pay

## 2021-04-18 ENCOUNTER — Other Ambulatory Visit (HOSPITAL_COMMUNITY): Payer: Self-pay

## 2021-04-20 ENCOUNTER — Other Ambulatory Visit (HOSPITAL_COMMUNITY): Payer: Self-pay

## 2021-05-02 ENCOUNTER — Other Ambulatory Visit: Payer: Self-pay

## 2021-05-02 ENCOUNTER — Inpatient Hospital Stay (HOSPITAL_COMMUNITY): Payer: Medicaid Other

## 2021-05-02 ENCOUNTER — Encounter (HOSPITAL_COMMUNITY): Payer: Self-pay

## 2021-05-02 ENCOUNTER — Inpatient Hospital Stay (HOSPITAL_COMMUNITY): Payer: Medicaid Other | Attending: Hematology

## 2021-05-02 VITALS — BP 173/98 | HR 55 | Temp 97.5°F | Resp 18 | Wt 181.2 lb

## 2021-05-02 DIAGNOSIS — C61 Malignant neoplasm of prostate: Secondary | ICD-10-CM | POA: Insufficient documentation

## 2021-05-02 DIAGNOSIS — C7951 Secondary malignant neoplasm of bone: Secondary | ICD-10-CM | POA: Diagnosis present

## 2021-05-02 LAB — COMPREHENSIVE METABOLIC PANEL
ALT: 14 U/L (ref 0–44)
AST: 16 U/L (ref 15–41)
Albumin: 4 g/dL (ref 3.5–5.0)
Alkaline Phosphatase: 51 U/L (ref 38–126)
Anion gap: 7 (ref 5–15)
BUN: 19 mg/dL (ref 8–23)
CO2: 26 mmol/L (ref 22–32)
Calcium: 9.2 mg/dL (ref 8.9–10.3)
Chloride: 107 mmol/L (ref 98–111)
Creatinine, Ser: 1.52 mg/dL — ABNORMAL HIGH (ref 0.61–1.24)
GFR, Estimated: 46 mL/min — ABNORMAL LOW (ref >60)
Glucose, Bld: 90 mg/dL (ref 70–99)
Potassium: 3.5 mmol/L (ref 3.5–5.1)
Sodium: 140 mmol/L (ref 135–145)
Total Bilirubin: 0.5 mg/dL (ref 0.3–1.2)
Total Protein: 6.9 g/dL (ref 6.5–8.1)

## 2021-05-02 MED ORDER — DENOSUMAB 120 MG/1.7ML ~~LOC~~ SOLN
120.0000 mg | Freq: Once | SUBCUTANEOUS | Status: AC
  Administered 2021-05-02: 120 mg via SUBCUTANEOUS
  Filled 2021-05-02: qty 1.7

## 2021-05-02 MED ORDER — GABAPENTIN 300 MG PO CAPS: 300.0000 mg | ORAL_CAPSULE | Freq: Every day | ORAL | 3 refills | Status: DC

## 2021-05-02 MED ORDER — AMLODIPINE BESYLATE 5 MG PO TABS: 5.0000 mg | ORAL_TABLET | Freq: Every day | ORAL | 1 refills | Status: DC

## 2021-05-02 NOTE — Progress Notes (Signed)
Patient drinks Orange Juice for calcium needs.  No complaints voiced today.     Denied tooth, jaw, and leg pain.  No recent or upcoming dental visits.  Labs reviewed.  Patient tolerated injection with no complaints voiced.  See MAR for details.  Patient stable during and after injection.  Site clean and dry with no bruising or swelling noted.  Band aid applied.  Vss with discharge and left in satisfactory condition with no s/s of distress noted.

## 2021-05-02 NOTE — Patient Instructions (Signed)
[facility]  Discharge Instructions: Thank you for choosing [facility] to provide your oncology and hematology care.  If you have a lab appointment with the Cancer Center, please come in thru the Main Entrance and check in at the main information desk.  Wear comfortable clothing and clothing appropriate for easy access to any Portacath or PICC line.   We strive to give you quality time with your provider. You may need to reschedule your appointment if you arrive late (15 or more minutes).  Arriving late affects you and other patients whose appointments are after yours.  Also, if you miss three or more appointments without notifying the office, you may be dismissed from the clinic at the provider's discretion.      For prescription refill requests, have your pharmacy contact our office and allow 72 hours for refills to be completed.        To help prevent nausea and vomiting after your treatment, we encourage you to take your nausea medication as directed.  BELOW ARE SYMPTOMS THAT SHOULD BE REPORTED IMMEDIATELY: *FEVER GREATER THAN 100.4 F (38 C) OR HIGHER *CHILLS OR SWEATING *NAUSEA AND VOMITING THAT IS NOT CONTROLLED WITH YOUR NAUSEA MEDICATION *UNUSUAL SHORTNESS OF BREATH *UNUSUAL BRUISING OR BLEEDING *URINARY PROBLEMS (pain or burning when urinating, or frequent urination) *BOWEL PROBLEMS (unusual diarrhea, constipation, pain near the anus) TENDERNESS IN MOUTH AND THROAT WITH OR WITHOUT PRESENCE OF ULCERS (sore throat, sores in mouth, or a toothache) UNUSUAL RASH, SWELLING OR PAIN  UNUSUAL VAGINAL DISCHARGE OR ITCHING   Items with * indicate a potential emergency and should be followed up as soon as possible or go to the Emergency Department if any problems should occur.  Please show the CHEMOTHERAPY ALERT CARD or IMMUNOTHERAPY ALERT CARD at check-in to the Emergency Department and triage nurse.  Should you have questions after your visit or need to cancel  or reschedule your appointment, please contact [facility] [phone]  and follow the prompts.  Office hours are 8:00 a.m. to 4:30 p.m. Monday - Friday. Please note that voicemails left after 4:00 p.m. may not be returned until the following business day.  We are closed weekends and major holidays. You have access to a nurse at all times for urgent questions. Please call the main number to the clinic [phone] and follow the prompts.  For any non-urgent questions, you may also contact your provider using MyChart. We now offer e-Visits for anyone 18 and older to request care online for non-urgent symptoms. For details visit [email or web address].   Also download the MyChart app! Go to the app store, search "MyChart", open the app, select [facility], and log in with your MyChart username and password.  Due to Covid, a mask is required upon entering the hospital/clinic. If you do not have a mask, one will be given to you upon arrival. For doctor visits, patients may have 1 support person aged 18 or older with them. For treatment visits, patients cannot have anyone with them due to current Covid guidelines and our immunocompromised population.  

## 2021-05-16 ENCOUNTER — Other Ambulatory Visit (HOSPITAL_COMMUNITY): Payer: Self-pay

## 2021-05-19 ENCOUNTER — Other Ambulatory Visit (HOSPITAL_COMMUNITY): Payer: Self-pay | Admitting: Physician Assistant

## 2021-05-19 ENCOUNTER — Other Ambulatory Visit (HOSPITAL_COMMUNITY): Payer: Self-pay

## 2021-05-19 DIAGNOSIS — C61 Malignant neoplasm of prostate: Secondary | ICD-10-CM

## 2021-05-19 MED ORDER — ABIRATERONE ACETATE 250 MG PO TABS
ORAL_TABLET | Freq: Every day | ORAL | 3 refills | Status: DC
  Filled 2021-05-19: qty 120, 30d supply, fill #0
  Filled 2021-06-16: qty 120, 30d supply, fill #1
  Filled 2021-07-17: qty 120, 30d supply, fill #2
  Filled 2021-08-23: qty 120, 30d supply, fill #3

## 2021-05-22 ENCOUNTER — Other Ambulatory Visit (HOSPITAL_COMMUNITY): Payer: Self-pay

## 2021-05-29 ENCOUNTER — Other Ambulatory Visit (HOSPITAL_COMMUNITY): Payer: Self-pay

## 2021-05-30 ENCOUNTER — Inpatient Hospital Stay (HOSPITAL_COMMUNITY): Payer: Medicaid Other

## 2021-05-30 ENCOUNTER — Other Ambulatory Visit: Payer: Self-pay

## 2021-05-30 ENCOUNTER — Encounter (HOSPITAL_COMMUNITY): Payer: Self-pay

## 2021-05-30 ENCOUNTER — Inpatient Hospital Stay (HOSPITAL_COMMUNITY): Payer: Medicaid Other | Attending: Hematology

## 2021-05-30 VITALS — BP 129/73 | HR 72 | Temp 98.4°F | Resp 18

## 2021-05-30 DIAGNOSIS — C61 Malignant neoplasm of prostate: Secondary | ICD-10-CM

## 2021-05-30 DIAGNOSIS — C7951 Secondary malignant neoplasm of bone: Secondary | ICD-10-CM | POA: Insufficient documentation

## 2021-05-30 LAB — COMPREHENSIVE METABOLIC PANEL
ALT: 12 U/L (ref 0–44)
AST: 17 U/L (ref 15–41)
Albumin: 3.8 g/dL (ref 3.5–5.0)
Alkaline Phosphatase: 42 U/L (ref 38–126)
Anion gap: 5 (ref 5–15)
BUN: 27 mg/dL — ABNORMAL HIGH (ref 8–23)
CO2: 24 mmol/L (ref 22–32)
Calcium: 8.9 mg/dL (ref 8.9–10.3)
Chloride: 110 mmol/L (ref 98–111)
Creatinine, Ser: 1.52 mg/dL — ABNORMAL HIGH (ref 0.61–1.24)
GFR, Estimated: 46 mL/min — ABNORMAL LOW (ref >60)
Glucose, Bld: 91 mg/dL (ref 70–99)
Potassium: 3.8 mmol/L (ref 3.5–5.1)
Sodium: 139 mmol/L (ref 135–145)
Total Bilirubin: 1 mg/dL (ref 0.3–1.2)
Total Protein: 6.8 g/dL (ref 6.5–8.1)

## 2021-05-30 MED ORDER — DENOSUMAB 120 MG/1.7ML ~~LOC~~ SOLN
120.0000 mg | Freq: Once | SUBCUTANEOUS | Status: AC
  Administered 2021-05-30: 120 mg via SUBCUTANEOUS
  Filled 2021-05-30: qty 1.7

## 2021-05-30 NOTE — Patient Instructions (Signed)
[place]  Discharge Instructions: Thank you for choosing [facility] to provide your oncology and hematology care.  If you have a lab appointment with the [facility], please come in thru the Main Entrance and check in at the main information desk.  Wear comfortable clothing and clothing appropriate for easy access to any Portacath or PICC line.   We strive to give you quality time with your provider. You may need to reschedule your appointment if you arrive late (15 or more minutes).  Arriving late affects you and other patients whose appointments are after yours.  Also, if you miss three or more appointments without notifying the office, you may be dismissed from the clinic at the providers discretion.      For prescription refill requests, have your pharmacy contact our office and allow 72 hours for refills to be completed.    Today you received the following Xgeva, return as scheduled.   To help prevent nausea and vomiting after your treatment, we encourage you to take your nausea medication as directed.  BELOW ARE SYMPTOMS THAT SHOULD BE REPORTED IMMEDIATELY: *FEVER GREATER THAN 100.4 F (38 C) OR HIGHER *CHILLS OR SWEATING *NAUSEA AND VOMITING THAT IS NOT CONTROLLED WITH YOUR NAUSEA MEDICATION *UNUSUAL SHORTNESS OF BREATH *UNUSUAL BRUISING OR BLEEDING *URINARY PROBLEMS (pain or burning when urinating, or frequent urination) *BOWEL PROBLEMS (unusual diarrhea, constipation, pain near the anus) TENDERNESS IN MOUTH AND THROAT WITH OR WITHOUT PRESENCE OF ULCERS (sore throat, sores in mouth, or a toothache) UNUSUAL RASH, SWELLING OR PAIN  UNUSUAL VAGINAL DISCHARGE OR ITCHING   Items with * indicate a potential emergency and should be followed up as soon as possible or go to the Emergency Department if any problems should occur.  Please show the CHEMOTHERAPY ALERT CARD or IMMUNOTHERAPY ALERT CARD at check-in to the Emergency Department and triage  nurse.  Should you have questions after your visit or need to cancel or reschedule your appointment, please contact [facility] [phone]  and follow the prompts.  Office hours are 8:00 a.m. to 4:30 p.m. Monday - Friday. Please note that voicemails left after 4:00 p.m. may not be returned until the following business day.  We are closed weekends and major holidays. You have access to a nurse at all times for urgent questions. Please call the main number to the clinic [phone] and follow the prompts.  For any non-urgent questions, you may also contact your provider using MyChart. We now offer e-Visits for anyone 41 and older to request care online for non-urgent symptoms. For details visit [email or web address].   Also download the MyChart app! Go to the app store, search "MyChart", open the app, select [place], and log in with your MyChart username and password.  Due to Covid, a mask is required upon entering the hospital/clinic. If you do not have a mask, one will be given to you upon arrival. For doctor visits, patients may have 1 support person aged 32 or older with them. For treatment visits, patients cannot have anyone with them due to current Covid guidelines and our immunocompromised population.

## 2021-05-30 NOTE — Progress Notes (Signed)
Patient presents today for Xgeva injection. Patient states he takes Vitamin C gummies at home. Denied tooth, jaw, and leg pain. No recent or upcoming dental visits. Labs reviewed. Patient tolerated injection with no complaints voiced. See MAR for details. Patient stable during and after injection. Site clean and dry with no bruising or swelling noted. Band aid applied. Vss with discharge and left in satisfactory condition with no s/s of distress.

## 2021-06-09 ENCOUNTER — Other Ambulatory Visit (HOSPITAL_COMMUNITY): Payer: Self-pay

## 2021-06-13 ENCOUNTER — Other Ambulatory Visit (HOSPITAL_COMMUNITY): Payer: Self-pay

## 2021-06-16 ENCOUNTER — Other Ambulatory Visit (HOSPITAL_COMMUNITY): Payer: Self-pay

## 2021-06-16 ENCOUNTER — Encounter (HOSPITAL_COMMUNITY): Payer: Self-pay

## 2021-06-16 NOTE — Progress Notes (Signed)
Patient's daughter Ander Purpura called asking why her father has two appointments this month. Called patient's daughter Ander Purpura and made her aware that the first appointment was for his injection on 05/30/21 and the second appointment on 06/20/21 is for labs before he sees Dr. Delton Coombes on 06/27/21. Patient's daughter is agreeable.

## 2021-06-20 ENCOUNTER — Other Ambulatory Visit: Payer: Self-pay

## 2021-06-20 ENCOUNTER — Inpatient Hospital Stay (HOSPITAL_COMMUNITY): Payer: Medicaid Other

## 2021-06-20 ENCOUNTER — Other Ambulatory Visit (HOSPITAL_COMMUNITY): Payer: Self-pay

## 2021-06-20 DIAGNOSIS — C61 Malignant neoplasm of prostate: Secondary | ICD-10-CM

## 2021-06-20 DIAGNOSIS — D500 Iron deficiency anemia secondary to blood loss (chronic): Secondary | ICD-10-CM

## 2021-06-20 LAB — COMPREHENSIVE METABOLIC PANEL
ALT: 13 U/L (ref 0–44)
AST: 20 U/L (ref 15–41)
Albumin: 3.8 g/dL (ref 3.5–5.0)
Alkaline Phosphatase: 41 U/L (ref 38–126)
Anion gap: 9 (ref 5–15)
BUN: 21 mg/dL (ref 8–23)
CO2: 24 mmol/L (ref 22–32)
Calcium: 9 mg/dL (ref 8.9–10.3)
Chloride: 106 mmol/L (ref 98–111)
Creatinine, Ser: 1.49 mg/dL — ABNORMAL HIGH (ref 0.61–1.24)
GFR, Estimated: 47 mL/min — ABNORMAL LOW (ref >60)
Glucose, Bld: 88 mg/dL (ref 70–99)
Potassium: 3.5 mmol/L (ref 3.5–5.1)
Sodium: 139 mmol/L (ref 135–145)
Total Bilirubin: 0.8 mg/dL (ref 0.3–1.2)
Total Protein: 7 g/dL (ref 6.5–8.1)

## 2021-06-20 LAB — CBC WITH DIFFERENTIAL/PLATELET
Abs Immature Granulocytes: 0 10*3/uL (ref 0.00–0.07)
Basophils Absolute: 0 10*3/uL (ref 0.0–0.1)
Basophils Relative: 1 %
Eosinophils Absolute: 0 10*3/uL (ref 0.0–0.5)
Eosinophils Relative: 1 %
HCT: 34 % — ABNORMAL LOW (ref 39.0–52.0)
Hemoglobin: 11.3 g/dL — ABNORMAL LOW (ref 13.0–17.0)
Immature Granulocytes: 0 %
Lymphocytes Relative: 50 %
Lymphs Abs: 2.1 10*3/uL (ref 0.7–4.0)
MCH: 31.2 pg (ref 26.0–34.0)
MCHC: 33.2 g/dL (ref 30.0–36.0)
MCV: 93.9 fL (ref 80.0–100.0)
Monocytes Absolute: 0.3 10*3/uL (ref 0.1–1.0)
Monocytes Relative: 8 %
Neutro Abs: 1.6 10*3/uL — ABNORMAL LOW (ref 1.7–7.7)
Neutrophils Relative %: 40 %
Platelets: 192 10*3/uL (ref 150–400)
RBC: 3.62 MIL/uL — ABNORMAL LOW (ref 4.22–5.81)
RDW: 13.2 % (ref 11.5–15.5)
WBC: 4.1 10*3/uL (ref 4.0–10.5)
nRBC: 0 % (ref 0.0–0.2)

## 2021-06-20 LAB — IRON AND TIBC
Iron: 80 ug/dL (ref 45–182)
Saturation Ratios: 25 % (ref 17.9–39.5)
TIBC: 322 ug/dL (ref 250–450)
UIBC: 242 ug/dL

## 2021-06-20 LAB — FERRITIN: Ferritin: 191 ng/mL (ref 24–336)

## 2021-06-20 LAB — PSA: Prostatic Specific Antigen: 0.18 ng/mL (ref 0.00–4.00)

## 2021-06-27 ENCOUNTER — Inpatient Hospital Stay (HOSPITAL_COMMUNITY): Payer: Medicaid Other | Attending: Hematology | Admitting: Hematology

## 2021-06-27 ENCOUNTER — Other Ambulatory Visit: Payer: Self-pay

## 2021-06-27 ENCOUNTER — Encounter (HOSPITAL_COMMUNITY): Payer: Self-pay

## 2021-06-27 ENCOUNTER — Inpatient Hospital Stay (HOSPITAL_COMMUNITY): Payer: Medicaid Other

## 2021-06-27 VITALS — BP 118/60 | HR 67 | Temp 97.9°F | Resp 17 | Wt 179.2 lb

## 2021-06-27 DIAGNOSIS — G629 Polyneuropathy, unspecified: Secondary | ICD-10-CM | POA: Insufficient documentation

## 2021-06-27 DIAGNOSIS — C61 Malignant neoplasm of prostate: Secondary | ICD-10-CM | POA: Diagnosis present

## 2021-06-27 DIAGNOSIS — N189 Chronic kidney disease, unspecified: Secondary | ICD-10-CM | POA: Insufficient documentation

## 2021-06-27 DIAGNOSIS — D631 Anemia in chronic kidney disease: Secondary | ICD-10-CM | POA: Insufficient documentation

## 2021-06-27 DIAGNOSIS — C7951 Secondary malignant neoplasm of bone: Secondary | ICD-10-CM | POA: Diagnosis not present

## 2021-06-27 DIAGNOSIS — I129 Hypertensive chronic kidney disease with stage 1 through stage 4 chronic kidney disease, or unspecified chronic kidney disease: Secondary | ICD-10-CM | POA: Insufficient documentation

## 2021-06-27 MED ORDER — DENOSUMAB 120 MG/1.7ML ~~LOC~~ SOLN
120.0000 mg | Freq: Once | SUBCUTANEOUS | Status: AC
  Administered 2021-06-27: 120 mg via SUBCUTANEOUS
  Filled 2021-06-27: qty 1.7

## 2021-06-27 NOTE — Progress Notes (Signed)
[place], [place] [address]   CLINIC:  Medical Oncology/Hematology  PCP:  Raiford Simmonds., PA-C [place] / [place] [address] [phone]   REASON FOR VISIT:  Follow-up for metastatic prostate cancer  PRIOR THERAPY:  1. Bilateral orchiectomy on 09/17/2017. 2. Docetaxel x 6 cycles from 11/19/2017 to 03/05/2018  NGS Results: not done  CURRENT THERAPY: Zytiga 1,000 mg daily; Xgeva monthly  BRIEF ONCOLOGIC HISTORY:  Oncology History  Malignant neoplasm of prostate ([place])  09/27/2017 Initial Diagnosis   Malignant neoplasm of prostate ([place])   11/19/2017 - 03/07/2018 Chemotherapy   The patient had pegfilgrastim (NEULASTA) injection 6 mg, 6 mg, Subcutaneous, Once, 6 of 6 cycles Administration: 6 mg (11/21/2017), 6 mg (12/12/2017), 6 mg (01/02/2018), 6 mg (01/24/2018), 6 mg (02/14/2018), 6 mg (03/07/2018) pegfilgrastim (NEULASTA ONPRO KIT) injection 6 mg, 6 mg, Subcutaneous, Once, 2 of 2 cycles DOCEtaxel (TAXOTERE) 140 mg in sodium chloride 0.9 % 250 mL chemo infusion, 75 mg/m2 = 140 mg, Intravenous,  Once, 6 of 6 cycles Administration: 140 mg (11/19/2017), 140 mg (12/10/2017), 140 mg (01/22/2018), 140 mg (12/31/2017), 140 mg (02/12/2018), 140 mg (03/05/2018) ondansetron (ZOFRAN) 8 mg, dexamethasone (DECADRON) 10 mg in sodium chloride 0.9 % 50 mL IVPB, , Intravenous,  Once, 4 of 4 cycles Administration:  (12/31/2017),  (01/22/2018),  (02/12/2018),  (03/05/2018)   for chemotherapy treatment.       CANCER STAGING: Cancer Staging  No matching staging information was found for the patient.  INTERVAL HISTORY:  [name], a 81 y.o. male, returns for routine follow-up of his metastatic prostate cancer. Carlos Harper was last seen on 04/11/2021.   Today he reports feeling good. He denies new pains, n/v/d, ankle swellings, and recent infections.   REVIEW OF SYSTEMS:  Review of Systems  Constitutional:  Negative for appetite change and  fatigue.  Cardiovascular:  Negative for leg swelling.  Gastrointestinal:  Negative for diarrhea, nausea and vomiting.  Neurological:  Positive for numbness.  All other systems reviewed and are negative.  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 ([place]) 09/27/2017   Sickle cell trait ([place])    Vertigo    Past Surgical History:  Procedure Laterality Date   CYSTOSCOPY WITH FULGERATION  09/17/2017   Procedure: CYSTOSCOPY WITH FULGERATION OF BLADDER NECK;  Surgeon: [name], MD;  Location: [facility];  Service: Urology;;   CYSTOSCOPY WITH STENT PLACEMENT Right 09/17/2017   Procedure: CYSTOSCOPY WITH RIGHT RETROGRADE PYELOGRAM ATTEMPTED STENT PLACEMENT;  Surgeon: [name], MD;  Location: [facility];  Service: Urology;  Laterality: Right;   left leg surgery due to [place] Bilateral 09/17/2017   Procedure: ORCHIECTOMY;  Surgeon: [name], MD;  Location: [facility];  Service: Urology;  Laterality: Bilateral;   PORTACATH PLACEMENT Left 10/14/2017   Procedure: INSERTION PORT-A-CATH;  Surgeon: Aviva Signs, MD;  Location: [facility];  Service: General;  Laterality: Left;   PROSTATE BIOPSY N/A 09/17/2017   Procedure: BIOPSY TRANSRECTAL ULTRASONIC PROSTATE (TUBP);  Surgeon: [name], MD;  Location: [facility];  Service: Urology;  Laterality: N/A;    SOCIAL HISTORY:  Social History   Socioeconomic History   Marital status: Single    Spouse name: Not on file   Number of children: Not on file   Years of education: Not on file  Highest education level: Not on file  Occupational History   Not on file  Tobacco Use   Smoking status: Never   Smokeless tobacco: Never  Vaping Use   Vaping Use: Never used  Substance and Sexual Activity   Alcohol use: Yes    Comment: Drinks beer occasionally    Drug use: No   Sexual activity: Not Currently  Other Topics Concern   Not on file  Social  History Narrative   Not on file   Social Determinants of Health   Financial Resource Strain: Not on file  Food Insecurity: Not on file  Transportation Needs: Not on file  Physical Activity: Not on file  Stress: Not on file  Social Connections: Not on file  Intimate Partner Violence: Not on file    FAMILY HISTORY:  Family History  Problem Relation Age of Onset   Hypertension Mother    Lung cancer Father    Lung cancer Brother    HIV/AIDS Brother    Breast cancer Daughter     CURRENT MEDICATIONS:  Current Outpatient Medications  Medication Sig Dispense Refill   abiraterone acetate (ZYTIGA) 250 MG tablet TAKE 4 TABLETS (1,000 MG TOTAL) BY MOUTH DAILY. TAKE ON AN EMPTY STOMACH 1 HOUR BEFORE OR 2 HOURS AFTER A MEAL 120 tablet 3   amLODipine (NORVASC) 5 MG tablet Take 1 tablet (5 mg total) by mouth daily. 30 tablet 1   docusate sodium (COLACE) 100 MG capsule Take 100 mg by mouth daily.     dronabinol (MARINOL) 5 MG capsule Take 1 capsule (5 mg total) by mouth 2 (two) times daily before a meal. 60 capsule 1   gabapentin (NEURONTIN) 300 MG capsule Take 1 capsule (300 mg total) by mouth at bedtime. 30 capsule 3   Misc. Devices MISC Please provide patient with a rollaider that has a seat. 1 each 0   predniSONE (DELTASONE) 5 MG tablet TAKE 1 TABLET BY MOUTH ONCE A DAY WITH BREAKFAST 30 tablet 6   No current facility-administered medications for this visit.    ALLERGIES:  No Known Allergies  PHYSICAL EXAM:  Performance status (ECOG): 1 - Symptomatic but completely ambulatory  There were no vitals filed for this visit. Wt Readings from Last 3 Encounters:  05/02/21 181 lb 3.2 oz (82.2 kg)  04/11/21 184 lb 14.4 oz (83.9 kg)  02/07/21 180 lb (81.6 kg)   Physical Exam Vitals reviewed.  Constitutional:      Appearance: Normal appearance.  Cardiovascular:     Rate and Rhythm: Normal rate and regular rhythm.     Pulses: Normal pulses.     Heart sounds: Normal heart sounds.   Pulmonary:     Effort: Pulmonary effort is normal.     Breath sounds: Normal breath sounds.  Musculoskeletal:     Right lower leg: No edema.     Left lower leg: No edema.  Neurological:     General: No focal deficit present.     Mental Status: He is alert and oriented to person, place, and time.  Psychiatric:        Mood and Affect: Mood normal.        Behavior: Behavior normal.     LABORATORY DATA:  I have reviewed the labs as listed.  CBC Latest Ref Rng & Units 06/20/2021 04/04/2021 03/07/2021  WBC 4.0 - 10.5 K/uL 4.1 4.1 5.3  Hemoglobin 13.0 - 17.0 g/dL 11.3(L) 11.4(L) 12.6(L)  Hematocrit 39.0 - 52.0 % 34.0(L) 34.3(L) 37.6(L)  Platelets  150 - 400 K/uL 192 200 201   CMP Latest Ref Rng & Units 06/20/2021 05/30/2021 05/02/2021  Glucose 70 - 99 mg/dL 88 91 90  BUN 8 - 23 mg/dL 21 27(H) 19  Creatinine 0.61 - 1.24 mg/dL 1.49(H) 1.52(H) 1.52(H)  Sodium 135 - 145 mmol/L 139 139 140  Potassium 3.5 - 5.1 mmol/L 3.5 3.8 3.5  Chloride 98 - 111 mmol/L 106 110 107  CO2 22 - 32 mmol/L _0 Calcium 8.9 - 10.3 mg/dL 9.0 8.9 9.2  Total Protein 6.5 - 8.1 g/dL 7.0 6.8 6.9  Total Bilirubin 0.3 - 1.2 mg/dL 0.8 1.0 0.5  Alkaline Phos 38 - 126 U/L 41 42 51  AST 15 - 41 U/L _1 ALT 0 - 44 U/L _2 DIAGNOSTIC IMAGING:  I have independently reviewed the scans and discussed with the patient. No results found.   ASSESSMENT:  1.  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 -6 cycles of docetaxel from 11/19/2017 through 03/05/2018. -Abiraterone and prednisone started on 05/07/2018.  PSA was 4.52.   2.  Bone metastasis: -Denosumab started on 07/21/2018.   3.  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  Last Feraheme on 01/22/2018.   4.  CKD: -Baseline creatinine between 1.5-1.6.   PLAN:  1.  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  No GI side effects. - Reviewed labs from 06/12/2021.  Normal LFTs.  PSA was 0.18, down from 0.23 in December 2022. - Continue Abiraterone 1000 mg daily.  RTC 3 months for follow-up.   2.  Bone metastasis: - Continue calcium supplements.  Calcium is 9.0.  Continue denosumab monthly.   3.  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 - Hemoglobin today is 11.3 with ferritin 191 and percent saturation 25. - If there is any worsening will consider parenteral iron therapy.   4.  Peripheral neuropathy: - Neuropathy in the feet is stable.  Continue gabapentin.   5.  CKD: - Baseline creatinine 1.4-1.6.  Today creatinine 1.49.  6.  Hypertension: - Continue Norvasc 5 mg daily.  Blood pressure today is 118/60.   Orders placed this encounter:  No orders of the defined types were placed in this encounter.    Derek Jack, MD [place] [phone]   I, Thana Ates, am acting as a scribe for Dr. Derek Jack.  I, Derek Jack MD, have reviewed the above documentation for accuracy and completeness, and I agree with the above.

## 2021-06-27 NOTE — Progress Notes (Signed)
Patient is taking Zytiga as prescribed.  He has not missed any doses and reports no side effects at this time.   

## 2021-06-27 NOTE — Progress Notes (Signed)
Patient drinks OJ for calcium needs.  Denied tooth, jaw, and leg pain.  No recent or upcoming dental visits.  Labs reviewed.  Patient tolerated injection with no complaints voiced.  See MAR for details.  Patient stable during and after injection.  Site clean and dry with no bruising or swelling noted.  Band aid applied.  Vss with discharge and left in satisfactory condition with no s/s of distress noted.   ?

## 2021-06-27 NOTE — Patient Instructions (Signed)
[name]  Discharge Instructions: Thank you for choosing [facility] to provide your oncology and hematology care.  If you have a lab appointment with the Cancer Center, please come in thru the Main Entrance and check in at the main information desk.  Wear comfortable clothing and clothing appropriate for easy access to any Portacath or PICC line.   We strive to give you quality time with your provider. You may need to reschedule your appointment if you arrive late (15 or more minutes).  Arriving late affects you and other patients whose appointments are after yours.  Also, if you miss three or more appointments without notifying the office, you may be dismissed from the clinic at the provider's discretion.      For prescription refill requests, have your pharmacy contact our office and allow 72 hours for refills to be completed.    Today you received the following chemotherapy and/or immunotherapy agents    To help prevent nausea and vomiting after your treatment, we encourage you to take your nausea medication as directed.  BELOW ARE SYMPTOMS THAT SHOULD BE REPORTED IMMEDIATELY: . *FEVER GREATER THAN 100.4 F (38 C) OR HIGHER . *CHILLS OR SWEATING . *NAUSEA AND VOMITING THAT IS NOT CONTROLLED WITH YOUR NAUSEA MEDICATION . *UNUSUAL SHORTNESS OF BREATH . *UNUSUAL BRUISING OR BLEEDING . *URINARY PROBLEMS (pain or burning when urinating, or frequent urination) . *BOWEL PROBLEMS (unusual diarrhea, constipation, pain near the anus) . TENDERNESS IN MOUTH AND THROAT WITH OR WITHOUT PRESENCE OF ULCERS (sore throat, sores in mouth, or a toothache) . UNUSUAL RASH, SWELLING OR PAIN  . UNUSUAL VAGINAL DISCHARGE OR ITCHING   Items with * indicate a potential emergency and should be followed up as soon as possible or go to the Emergency Department if any problems should occur.  Please show the CHEMOTHERAPY ALERT CARD or IMMUNOTHERAPY ALERT CARD at check-in to the  Emergency Department and triage nurse.  Should you have questions after your visit or need to cancel or reschedule your appointment, please contact [facility] [phone]  and follow the prompts.  Office hours are 8:00 a.m. to 4:30 p.m. Monday - Friday. Please note that voicemails left after 4:00 p.m. may not be returned until the following business day.  We are closed weekends and major holidays. You have access to a nurse at all times for urgent questions. Please call the main number to the clinic [phone] and follow the prompts.  For any non-urgent questions, you may also contact your provider using MyChart. We now offer e-Visits for anyone 18 and older to request care online for non-urgent symptoms. For details visit [email or web address].   Also download the MyChart app! Go to the app store, search "MyChart", open the app, select [facility], and log in with your MyChart username and password.  Due to Covid, a mask is required upon entering the hospital/clinic. If you do not have a mask, one will be given to you upon arrival. For doctor visits, patients may have 1 support person aged 18 or older with them. For treatment visits, patients cannot have anyone with them due to current Covid guidelines and our immunocompromised population.  

## 2021-06-27 NOTE — Patient Instructions (Addendum)
[place] at [facility] ?Discharge Instructions ? ? ?You were seen and examined today by Dr. Delton Coombes. ? ?He reviewed the results of your lab work which is normal/stable. ? ?Continue Zytiga as prescribed. ? ?You will receive Xgeva injection today and monthly. ? ?Return as scheduled in 3 months ? ? ? ?Thank you for choosing [place] at [facility] to provide your oncology and hematology care.  To afford each patient quality time with our provider, please arrive at least 15 minutes before your scheduled appointment time.  ? ?If you have a lab appointment with the [facility] please come in thru the Main Entrance and check in at the main information desk. ? ?You need to re-schedule your appointment should you arrive 10 or more minutes late.  We strive to give you quality time with our providers, and arriving late affects you and other patients whose appointments are after yours.  Also, if you no show three or more times for appointments you may be dismissed from the clinic at the providers discretion.     ?Again, thank you for choosing [facility].  Our hope is that these requests will decrease the amount of time that you wait before being seen by our physicians.       ?_____________________________________________________________ ? ?Should you have questions after your visit to [facility], please contact our office at [phone] and follow the prompts.  Our office hours are 8:00 a.m. and 4:30 p.m. Monday - Friday.  Please note that voicemails left after 4:00 p.m. may not be returned until the following business day.  We are closed weekends and major holidays.  You do have access to a nurse 24-7, just call the main number to the clinic [phone] and do not press any options, hold on the line and a nurse will answer the phone.   ? ?For prescription refill requests, have your pharmacy contact our office and allow 72 hours.   ? ?Due to  Covid, you will need to wear a mask upon entering the hospital. If you do not have a mask, a mask will be given to you at the Main Entrance upon arrival. For doctor visits, patients may have 1 support person age 82 or older with them. For treatment visits, patients can not have anyone with them due to social distancing guidelines and our immunocompromised population.  ? ?   ?

## 2021-07-05 ENCOUNTER — Other Ambulatory Visit (HOSPITAL_COMMUNITY): Payer: Self-pay | Admitting: Hematology

## 2021-07-05 DIAGNOSIS — C61 Malignant neoplasm of prostate: Secondary | ICD-10-CM

## 2021-07-13 ENCOUNTER — Other Ambulatory Visit (HOSPITAL_COMMUNITY): Payer: Self-pay

## 2021-07-17 ENCOUNTER — Other Ambulatory Visit (HOSPITAL_COMMUNITY): Payer: Self-pay

## 2021-07-21 ENCOUNTER — Other Ambulatory Visit (HOSPITAL_COMMUNITY): Payer: Self-pay

## 2021-07-25 ENCOUNTER — Inpatient Hospital Stay (HOSPITAL_COMMUNITY): Payer: Medicaid Other

## 2021-07-25 ENCOUNTER — Encounter (HOSPITAL_COMMUNITY): Payer: Self-pay

## 2021-07-25 ENCOUNTER — Inpatient Hospital Stay (HOSPITAL_COMMUNITY): Payer: Medicaid Other | Attending: Hematology

## 2021-07-25 VITALS — BP 141/79 | HR 71 | Temp 98.2°F | Resp 18

## 2021-07-25 DIAGNOSIS — C61 Malignant neoplasm of prostate: Secondary | ICD-10-CM | POA: Diagnosis present

## 2021-07-25 DIAGNOSIS — C7951 Secondary malignant neoplasm of bone: Secondary | ICD-10-CM | POA: Diagnosis not present

## 2021-07-25 LAB — COMPREHENSIVE METABOLIC PANEL
ALT: 12 U/L (ref 0–44)
AST: 17 U/L (ref 15–41)
Albumin: 4 g/dL (ref 3.5–5.0)
Alkaline Phosphatase: 47 U/L (ref 38–126)
Anion gap: 8 (ref 5–15)
BUN: 18 mg/dL (ref 8–23)
CO2: 26 mmol/L (ref 22–32)
Calcium: 9.1 mg/dL (ref 8.9–10.3)
Chloride: 107 mmol/L (ref 98–111)
Creatinine, Ser: 1.32 mg/dL — ABNORMAL HIGH (ref 0.61–1.24)
GFR, Estimated: 54 mL/min — ABNORMAL LOW (ref >60)
Glucose, Bld: 99 mg/dL (ref 70–99)
Potassium: 3.4 mmol/L — ABNORMAL LOW (ref 3.5–5.1)
Sodium: 141 mmol/L (ref 135–145)
Total Bilirubin: 1 mg/dL (ref 0.3–1.2)
Total Protein: 7 g/dL (ref 6.5–8.1)

## 2021-07-25 MED ORDER — DENOSUMAB 120 MG/1.7ML ~~LOC~~ SOLN
120.0000 mg | Freq: Once | SUBCUTANEOUS | Status: AC
  Administered 2021-07-25: 120 mg via SUBCUTANEOUS
  Filled 2021-07-25: qty 1.7

## 2021-07-25 NOTE — Progress Notes (Signed)
Patient taking calcium as directed.  Denied tooth, jaw, and leg pain.  No recent or upcoming dental visits.  Labs reviewed.  Patient tolerated injection with no complaints voiced.  See MAR for details.  Patient stable during and after injection.  Site clean and dry with no bruising or swelling noted.  Band aid applied.  Vss with discharge and left in satisfactory condition with no s/s of distress noted.   

## 2021-07-25 NOTE — Patient Instructions (Signed)
[facility]  Discharge Instructions: Thank you for choosing [facility] to provide your oncology and hematology care.  If you have a lab appointment with the Cancer Center, please come in thru the Main Entrance and check in at the main information desk.  Wear comfortable clothing and clothing appropriate for easy access to any Portacath or PICC line.   We strive to give you quality time with your provider. You may need to reschedule your appointment if you arrive late (15 or more minutes).  Arriving late affects you and other patients whose appointments are after yours.  Also, if you miss three or more appointments without notifying the office, you may be dismissed from the clinic at the provider's discretion.      For prescription refill requests, have your pharmacy contact our office and allow 72 hours for refills to be completed.    Today you received the following chemotherapy and/or immunotherapy agents xgeva.       To help prevent nausea and vomiting after your treatment, we encourage you to take your nausea medication as directed.  BELOW ARE SYMPTOMS THAT SHOULD BE REPORTED IMMEDIATELY: *FEVER GREATER THAN 100.4 F (38 C) OR HIGHER *CHILLS OR SWEATING *NAUSEA AND VOMITING THAT IS NOT CONTROLLED WITH YOUR NAUSEA MEDICATION *UNUSUAL SHORTNESS OF BREATH *UNUSUAL BRUISING OR BLEEDING *URINARY PROBLEMS (pain or burning when urinating, or frequent urination) *BOWEL PROBLEMS (unusual diarrhea, constipation, pain near the anus) TENDERNESS IN MOUTH AND THROAT WITH OR WITHOUT PRESENCE OF ULCERS (sore throat, sores in mouth, or a toothache) UNUSUAL RASH, SWELLING OR PAIN  UNUSUAL VAGINAL DISCHARGE OR ITCHING   Items with * indicate a potential emergency and should be followed up as soon as possible or go to the Emergency Department if any problems should occur.  Please show the CHEMOTHERAPY ALERT CARD or IMMUNOTHERAPY ALERT CARD at check-in to the Emergency  Department and triage nurse.  Should you have questions after your visit or need to cancel or reschedule your appointment, please contact [facility] [phone]  and follow the prompts.  Office hours are 8:00 a.m. to 4:30 p.m. Monday - Friday. Please note that voicemails left after 4:00 p.m. may not be returned until the following business day.  We are closed weekends and major holidays. You have access to a nurse at all times for urgent questions. Please call the main number to the clinic [phone] and follow the prompts.  For any non-urgent questions, you may also contact your provider using MyChart. We now offer e-Visits for anyone 18 and older to request care online for non-urgent symptoms. For details visit [email or web address].   Also download the MyChart app! Go to the app store, search "MyChart", open the app, select , and log in with your MyChart username and password.  Due to Covid, a mask is required upon entering the hospital/clinic. If you do not have a mask, one will be given to you upon arrival. For doctor visits, patients may have 1 support person aged 18 or older with them. For treatment visits, patients cannot have anyone with them due to current Covid guidelines and our immunocompromised population.  

## 2021-07-26 MED ORDER — LANREOTIDE ACETATE 120 MG/0.5ML ~~LOC~~ SOLN
SUBCUTANEOUS | Status: AC
  Filled 2021-07-26: qty 240

## 2021-08-07 ENCOUNTER — Other Ambulatory Visit (HOSPITAL_COMMUNITY): Payer: Self-pay | Admitting: Hematology

## 2021-08-07 ENCOUNTER — Other Ambulatory Visit (HOSPITAL_COMMUNITY): Payer: Self-pay

## 2021-08-07 DIAGNOSIS — C61 Malignant neoplasm of prostate: Secondary | ICD-10-CM

## 2021-08-08 ENCOUNTER — Other Ambulatory Visit (HOSPITAL_COMMUNITY): Payer: Self-pay

## 2021-08-15 ENCOUNTER — Other Ambulatory Visit (HOSPITAL_COMMUNITY): Payer: Self-pay

## 2021-08-17 ENCOUNTER — Other Ambulatory Visit (HOSPITAL_COMMUNITY): Payer: Self-pay

## 2021-08-21 ENCOUNTER — Other Ambulatory Visit (HOSPITAL_COMMUNITY): Payer: Self-pay

## 2021-08-22 ENCOUNTER — Inpatient Hospital Stay (HOSPITAL_COMMUNITY): Payer: Medicaid Other

## 2021-08-22 ENCOUNTER — Inpatient Hospital Stay (HOSPITAL_COMMUNITY): Payer: Medicaid Other | Attending: Hematology

## 2021-08-22 ENCOUNTER — Encounter (HOSPITAL_COMMUNITY): Payer: Self-pay

## 2021-08-22 ENCOUNTER — Telehealth (HOSPITAL_COMMUNITY): Payer: Self-pay

## 2021-08-22 VITALS — BP 146/88 | HR 104 | Temp 97.5°F | Resp 19 | Wt 181.6 lb

## 2021-08-22 DIAGNOSIS — D573 Sickle-cell trait: Secondary | ICD-10-CM | POA: Diagnosis not present

## 2021-08-22 DIAGNOSIS — C7951 Secondary malignant neoplasm of bone: Secondary | ICD-10-CM | POA: Insufficient documentation

## 2021-08-22 DIAGNOSIS — C61 Malignant neoplasm of prostate: Secondary | ICD-10-CM

## 2021-08-22 DIAGNOSIS — I10 Essential (primary) hypertension: Secondary | ICD-10-CM | POA: Insufficient documentation

## 2021-08-22 LAB — COMPREHENSIVE METABOLIC PANEL
ALT: 15 U/L (ref 0–44)
AST: 19 U/L (ref 15–41)
Albumin: 4 g/dL (ref 3.5–5.0)
Alkaline Phosphatase: 48 U/L (ref 38–126)
Anion gap: 6 (ref 5–15)
BUN: 22 mg/dL (ref 8–23)
CO2: 29 mmol/L (ref 22–32)
Calcium: 9.2 mg/dL (ref 8.9–10.3)
Chloride: 106 mmol/L (ref 98–111)
Creatinine, Ser: 1.49 mg/dL — ABNORMAL HIGH (ref 0.61–1.24)
GFR, Estimated: 47 mL/min — ABNORMAL LOW (ref >60)
Glucose, Bld: 84 mg/dL (ref 70–99)
Potassium: 3.9 mmol/L (ref 3.5–5.1)
Sodium: 141 mmol/L (ref 135–145)
Total Bilirubin: 1.2 mg/dL (ref 0.3–1.2)
Total Protein: 7.1 g/dL (ref 6.5–8.1)

## 2021-08-22 MED ORDER — AMLODIPINE BESYLATE 5 MG PO TABS: 5.0000 mg | ORAL_TABLET | Freq: Every day | ORAL | 0 refills | Status: DC

## 2021-08-22 MED ORDER — DENOSUMAB 120 MG/1.7ML ~~LOC~~ SOLN
120.0000 mg | Freq: Once | SUBCUTANEOUS | Status: AC
  Administered 2021-08-22: 120 mg via SUBCUTANEOUS
  Filled 2021-08-22: qty 1.7

## 2021-08-22 NOTE — Patient Instructions (Signed)
[place]  Discharge Instructions: ?Thank you for choosing [facility] to provide your oncology and hematology care.  ?If you have a lab appointment with the [facility], please come in thru the Main Entrance and check in at the main information desk. ? ?Wear comfortable clothing and clothing appropriate for easy access to any Portacath or PICC line.  ? ?We strive to give you quality time with your provider. You may need to reschedule your appointment if you arrive late (15 or more minutes).  Arriving late affects you and other patients whose appointments are after yours.  Also, if you miss three or more appointments without notifying the office, you may be dismissed from the clinic at the provider?s discretion.    ?  ?For prescription refill requests, have your pharmacy contact our office and allow 72 hours for refills to be completed.   ? ?Today you received the following  Xgeva, return as scheduled. ?  ?To help prevent nausea and vomiting after your treatment, we encourage you to take your nausea medication as directed. ? ?BELOW ARE SYMPTOMS THAT SHOULD BE REPORTED IMMEDIATELY: ?*FEVER GREATER THAN 100.4 F (38 ?C) OR HIGHER ?*CHILLS OR SWEATING ?*NAUSEA AND VOMITING THAT IS NOT CONTROLLED WITH YOUR NAUSEA MEDICATION ?*UNUSUAL SHORTNESS OF BREATH ?*UNUSUAL BRUISING OR BLEEDING ?*URINARY PROBLEMS (pain or burning when urinating, or frequent urination) ?*BOWEL PROBLEMS (unusual diarrhea, constipation, pain near the anus) ?TENDERNESS IN MOUTH AND THROAT WITH OR WITHOUT PRESENCE OF ULCERS (sore throat, sores in mouth, or a toothache) ?UNUSUAL RASH, SWELLING OR PAIN  ?UNUSUAL VAGINAL DISCHARGE OR ITCHING  ? ?Items with * indicate a potential emergency and should be followed up as soon as possible or go to the Emergency Department if any problems should occur. ? ?Please show the CHEMOTHERAPY ALERT CARD or IMMUNOTHERAPY ALERT CARD at check-in to the Emergency Department and triage  nurse. ? ?Should you have questions after your visit or need to cancel or reschedule your appointment, please contact [facility] [phone]  and follow the prompts.  Office hours are 8:00 a.m. to 4:30 p.m. Monday - Friday. Please note that voicemails left after 4:00 p.m. may not be returned until the following business day.  We are closed weekends and major holidays. You have access to a nurse at all times for urgent questions. Please call the main number to the clinic [phone] and follow the prompts. ? ?For any non-urgent questions, you may also contact your provider using MyChart. We now offer e-Visits for anyone 2 and older to request care online for non-urgent symptoms. For details visit [email or web address]. ?  ?Also download the MyChart app! Go to the app store, search "MyChart", open the app, select [place], and log in with your MyChart username and password. ? ?Due to Covid, a mask is required upon entering the hospital/clinic. If you do not have a mask, one will be given to you upon arrival. For doctor visits, patients may have 1 support person aged 73 or older with them. For treatment visits, patients cannot have anyone with them due to current Covid guidelines and our immunocompromised population.  ?

## 2021-08-22 NOTE — Progress Notes (Signed)
Patient taking calcium as directed. Denied tooth, jaw, and leg pain. No recent or upcoming dental visits. Labs reviewed. Patient tolerated injection with no complaints voiced. See MAR for details. Patient stable during and after injection. Site clean and dry with no bruising or swelling noted. Band aid applied. Vss with discharge and left in satisfactory condition with no s/s of distress.  

## 2021-08-23 ENCOUNTER — Other Ambulatory Visit (HOSPITAL_COMMUNITY): Payer: Self-pay

## 2021-09-13 ENCOUNTER — Other Ambulatory Visit (HOSPITAL_COMMUNITY): Payer: Self-pay

## 2021-09-14 ENCOUNTER — Inpatient Hospital Stay (HOSPITAL_COMMUNITY): Payer: Medicaid Other

## 2021-09-14 DIAGNOSIS — C61 Malignant neoplasm of prostate: Secondary | ICD-10-CM | POA: Diagnosis not present

## 2021-09-14 LAB — CBC WITH DIFFERENTIAL/PLATELET
Abs Immature Granulocytes: 0 10*3/uL (ref 0.00–0.07)
Basophils Absolute: 0 10*3/uL (ref 0.0–0.1)
Basophils Relative: 1 %
Eosinophils Absolute: 0 10*3/uL (ref 0.0–0.5)
Eosinophils Relative: 1 %
HCT: 33.3 % — ABNORMAL LOW (ref 39.0–52.0)
Hemoglobin: 11.3 g/dL — ABNORMAL LOW (ref 13.0–17.0)
Immature Granulocytes: 0 %
Lymphocytes Relative: 49 %
Lymphs Abs: 2.2 10*3/uL (ref 0.7–4.0)
MCH: 32 pg (ref 26.0–34.0)
MCHC: 33.9 g/dL (ref 30.0–36.0)
MCV: 94.3 fL (ref 80.0–100.0)
Monocytes Absolute: 0.4 10*3/uL (ref 0.1–1.0)
Monocytes Relative: 8 %
Neutro Abs: 1.8 10*3/uL (ref 1.7–7.7)
Neutrophils Relative %: 41 %
RBC: 3.53 MIL/uL — ABNORMAL LOW (ref 4.22–5.81)
RDW: 13.9 % (ref 11.5–15.5)
Smear Review: UNDETERMINED
WBC Morphology: >10
WBC: 4.4 10*3/uL (ref 4.0–10.5)
nRBC: 0 % (ref 0.0–0.2)

## 2021-09-14 LAB — COMPREHENSIVE METABOLIC PANEL
ALT: 17 U/L (ref 0–44)
AST: 22 U/L (ref 15–41)
Albumin: 4 g/dL (ref 3.5–5.0)
Alkaline Phosphatase: 45 U/L (ref 38–126)
Anion gap: 4 — ABNORMAL LOW (ref 5–15)
BUN: 25 mg/dL — ABNORMAL HIGH (ref 8–23)
CO2: 26 mmol/L (ref 22–32)
Calcium: 8.9 mg/dL (ref 8.9–10.3)
Chloride: 110 mmol/L (ref 98–111)
Creatinine, Ser: 1.58 mg/dL — ABNORMAL HIGH (ref 0.61–1.24)
GFR, Estimated: 44 mL/min — ABNORMAL LOW (ref >60)
Glucose, Bld: 116 mg/dL — ABNORMAL HIGH (ref 70–99)
Potassium: 3.5 mmol/L (ref 3.5–5.1)
Sodium: 140 mmol/L (ref 135–145)
Total Bilirubin: 1 mg/dL (ref 0.3–1.2)
Total Protein: 6.4 g/dL — ABNORMAL LOW (ref 6.5–8.1)

## 2021-09-14 LAB — IRON AND TIBC
Iron: 79 ug/dL (ref 45–182)
Saturation Ratios: 26 % (ref 17.9–39.5)
TIBC: 299 ug/dL (ref 250–450)
UIBC: 220 ug/dL

## 2021-09-14 LAB — FERRITIN: Ferritin: 198 ng/mL (ref 24–336)

## 2021-09-14 LAB — PSA: Prostatic Specific Antigen: 0.19 ng/mL (ref 0.00–4.00)

## 2021-09-15 ENCOUNTER — Other Ambulatory Visit (HOSPITAL_COMMUNITY): Payer: Self-pay

## 2021-09-19 ENCOUNTER — Other Ambulatory Visit (HOSPITAL_COMMUNITY): Payer: Self-pay

## 2021-09-19 ENCOUNTER — Inpatient Hospital Stay (HOSPITAL_COMMUNITY): Payer: Medicaid Other

## 2021-09-19 ENCOUNTER — Other Ambulatory Visit (HOSPITAL_COMMUNITY): Payer: Self-pay | Admitting: Physician Assistant

## 2021-09-19 ENCOUNTER — Inpatient Hospital Stay (HOSPITAL_COMMUNITY): Payer: Medicaid Other | Admitting: Hematology

## 2021-09-19 ENCOUNTER — Other Ambulatory Visit (HOSPITAL_COMMUNITY): Payer: Self-pay | Admitting: *Deleted

## 2021-09-19 ENCOUNTER — Other Ambulatory Visit (HOSPITAL_COMMUNITY): Payer: Self-pay | Admitting: Hematology

## 2021-09-19 VITALS — BP 143/89 | HR 72 | Temp 97.2°F | Resp 18 | Wt 176.9 lb

## 2021-09-19 DIAGNOSIS — C61 Malignant neoplasm of prostate: Secondary | ICD-10-CM

## 2021-09-19 MED ORDER — DENOSUMAB 120 MG/1.7ML ~~LOC~~ SOLN
120.0000 mg | Freq: Once | SUBCUTANEOUS | Status: AC
  Administered 2021-09-19: 120 mg via SUBCUTANEOUS
  Filled 2021-09-19: qty 1.7

## 2021-09-19 MED ORDER — ABIRATERONE ACETATE 250 MG PO TABS
ORAL_TABLET | Freq: Every day | ORAL | 3 refills | Status: DC
  Filled 2021-09-19: qty 120, 30d supply, fill #0
  Filled 2021-10-19: qty 120, 30d supply, fill #1
  Filled 2021-11-21: qty 120, 30d supply, fill #2
  Filled 2021-12-18: qty 120, 30d supply, fill #3

## 2021-09-19 NOTE — Progress Notes (Signed)
Patient taking calcium as directed. Denied tooth, jaw, and leg pain. No recent or upcoming dental visits. Labs reviewed. Patient tolerated injection with no complaints voiced. See MAR for details. Patient stable during and after injection. Site clean and dry with no bruising or swelling noted. Band aid applied. Vss with discharge and left in satisfactory condition with no s/s of distress.  

## 2021-09-19 NOTE — Patient Instructions (Signed)
[place] at [facility] Discharge Instructions   You were seen and examined today by Dr. Delton Coombes.  He reviewed the results of your lab work which is normal/stable.  Continue Zytiga and prednisone as prescribed.  You will receive your Xgeva (bone shot) today.  Return as scheduled.    Thank you for choosing [place] at [facility] to provide your oncology and hematology care.  To afford each patient quality time with our provider, please arrive at least 15 minutes before your scheduled appointment time.   If you have a lab appointment with the [facility] please come in thru the Main Entrance and check in at the main information desk.  You need to re-schedule your appointment should you arrive 10 or more minutes late.  We strive to give you quality time with our providers, and arriving late affects you and other patients whose appointments are after yours.  Also, if you no show three or more times for appointments you may be dismissed from the clinic at the providers discretion.     Again, thank you for choosing [facility].  Our hope is that these requests will decrease the amount of time that you wait before being seen by our physicians.       _____________________________________________________________  Should you have questions after your visit to [facility], please contact our office at [phone] and follow the prompts.  Our office hours are 8:00 a.m. and 4:30 p.m. Monday - Friday.  Please note that voicemails left after 4:00 p.m. may not be returned until the following business day.  We are closed weekends and major holidays.  You do have access to a nurse 24-7, just call the main number to the clinic [phone] and do not press any options, hold on the line and a nurse will answer the phone.    For prescription refill requests, have your pharmacy contact our office and allow 72 hours.    Due to  Covid, you will need to wear a mask upon entering the hospital. If you do not have a mask, a mask will be given to you at the Main Entrance upon arrival. For doctor visits, patients may have 1 support person age 31 or older with them. For treatment visits, patients can not have anyone with them due to social distancing guidelines and our immunocompromised population.

## 2021-09-19 NOTE — Patient Instructions (Signed)
[place]  Discharge Instructions: Thank you for choosing [place] to provide your oncology and hematology care.  If you have a lab appointment with the [facility], please come in thru the Main Entrance and check in at the main information desk.  Wear comfortable clothing and clothing appropriate for easy access to any Portacath or PICC line.   We strive to give you quality time with your provider. You may need to reschedule your appointment if you arrive late (15 or more minutes).  Arriving late affects you and other patients whose appointments are after yours.  Also, if you miss three or more appointments without notifying the office, you may be dismissed from the clinic at the provider's discretion.      For prescription refill requests, have your pharmacy contact our office and allow 72 hours for refills to be completed.    Today you received the following Xgeva, return as scheduled.   To help prevent nausea and vomiting after your treatment, we encourage you to take your nausea medication as directed.  BELOW ARE SYMPTOMS THAT SHOULD BE REPORTED IMMEDIATELY: *FEVER GREATER THAN 100.4 F (38 C) OR HIGHER *CHILLS OR SWEATING *NAUSEA AND VOMITING THAT IS NOT CONTROLLED WITH YOUR NAUSEA MEDICATION *UNUSUAL SHORTNESS OF BREATH *UNUSUAL BRUISING OR BLEEDING *URINARY PROBLEMS (pain or burning when urinating, or frequent urination) *BOWEL PROBLEMS (unusual diarrhea, constipation, pain near the anus) TENDERNESS IN MOUTH AND THROAT WITH OR WITHOUT PRESENCE OF ULCERS (sore throat, sores in mouth, or a toothache) UNUSUAL RASH, SWELLING OR PAIN  UNUSUAL VAGINAL DISCHARGE OR ITCHING   Items with * indicate a potential emergency and should be followed up as soon as possible or go to the Emergency Department if any problems should occur.  Please show the CHEMOTHERAPY ALERT CARD or IMMUNOTHERAPY ALERT CARD at check-in to the Emergency Department and triage  nurse.  Should you have questions after your visit or need to cancel or reschedule your appointment, please contact [facility] [phone]  and follow the prompts.  Office hours are 8:00 a.m. to 4:30 p.m. Monday - Friday. Please note that voicemails left after 4:00 p.m. may not be returned until the following business day.  We are closed weekends and major holidays. You have access to a nurse at all times for urgent questions. Please call the main number to the clinic [phone] and follow the prompts.  For any non-urgent questions, you may also contact your provider using MyChart. We now offer e-Visits for anyone 48 and older to request care online for non-urgent symptoms. For details visit [email or web address].   Also download the MyChart app! Go to the app store, search "MyChart", open the app, select [place], and log in with your MyChart username and password.  Due to Covid, a mask is required upon entering the hospital/clinic. If you do not have a mask, one will be given to you upon arrival. For doctor visits, patients may have 1 support person aged 45 or older with them. For treatment visits, patients cannot have anyone with them due to current Covid guidelines and our immunocompromised population.

## 2021-09-19 NOTE — Progress Notes (Signed)
Carlos Harper, Carlos Harper [id number]   CLINIC:  Medical Oncology/Hematology  PCP:  Carlos Harper., PA-C [place] / [place] [address] [phone]   REASON FOR VISIT:  Follow-up for metastatic prostate cancer  PRIOR THERAPY:  1. Bilateral orchiectomy on 09/17/2017. 2. Docetaxel x 6 cycles from 11/19/2017 to 03/05/2018  NGS Results: not done  CURRENT THERAPY: Zytiga 1,000 mg daily; Xgeva monthly  BRIEF ONCOLOGIC HISTORY:  Oncology History  Malignant neoplasm of prostate ([facility])  09/27/2017 Initial Diagnosis   Malignant neoplasm of prostate ([facility])    11/19/2017 - 03/07/2018 Chemotherapy   The patient had pegfilgrastim (NEULASTA) injection 6 mg, 6 mg, Subcutaneous, Once, 6 of 6 cycles Administration: 6 mg (11/21/2017), 6 mg (12/12/2017), 6 mg (01/02/2018), 6 mg (01/24/2018), 6 mg (02/14/2018), 6 mg (03/07/2018) pegfilgrastim (NEULASTA ONPRO KIT) injection 6 mg, 6 mg, Subcutaneous, Once, 2 of 2 cycles DOCEtaxel (TAXOTERE) 140 mg in sodium chloride 0.9 % 250 mL chemo infusion, 75 mg/m2 = 140 mg, Intravenous,  Once, 6 of 6 cycles Administration: 140 mg (11/19/2017), 140 mg (12/10/2017), 140 mg (01/22/2018), 140 mg (12/31/2017), 140 mg (02/12/2018), 140 mg (03/05/2018) ondansetron (ZOFRAN) 8 mg, dexamethasone (DECADRON) 10 mg in sodium chloride 0.9 % 50 mL IVPB, , Intravenous,  Once, 4 of 4 cycles Administration:  (12/31/2017),  (01/22/2018),  (02/12/2018),  (03/05/2018)   for chemotherapy treatment.       CANCER STAGING: Cancer Staging  No matching staging information was found for the patient.  INTERVAL HISTORY:  Carlos Harper, a 81 y.o. male, returns for routine follow-up of his metastatic prostate cancer. [name] was last seen on 06/27/2021.   Today he reports feeling good. He is taking Zytiga and reports tolerating it well. He denies n/v/d/c, new pains, and jaw pains. His appetite is good.   REVIEW OF SYSTEMS:  Review of Systems   Constitutional:  Negative for appetite change and fatigue.  Gastrointestinal:  Negative for constipation, diarrhea, nausea and vomiting.  Musculoskeletal:  Positive for arthralgias (L foot).  Neurological:  Positive for numbness.  All other systems reviewed and are negative.  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 ([facility]) 09/27/2017   Sickle cell trait ([facility])    Vertigo    Past Surgical History:  Procedure Laterality Date   CYSTOSCOPY WITH FULGERATION  09/17/2017   Procedure: CYSTOSCOPY WITH FULGERATION OF BLADDER NECK;  Surgeon: [name], MD;  Location: [facility];  Service: Urology;;   CYSTOSCOPY WITH STENT PLACEMENT Right 09/17/2017   Procedure: CYSTOSCOPY WITH RIGHT RETROGRADE PYELOGRAM ATTEMPTED STENT PLACEMENT;  Surgeon: [name], MD;  Location: [facility];  Service: Urology;  Laterality: Right;   left leg surgery due to [place] Bilateral 09/17/2017   Procedure: ORCHIECTOMY;  Surgeon: [name], MD;  Location: [facility];  Service: Urology;  Laterality: Bilateral;   PORTACATH PLACEMENT Left 10/14/2017   Procedure: INSERTION PORT-A-CATH;  Surgeon: Aviva Signs, MD;  Location: [facility];  Service: General;  Laterality: Left;   PROSTATE BIOPSY N/A 09/17/2017   Procedure: BIOPSY TRANSRECTAL ULTRASONIC PROSTATE (TUBP);  Surgeon: [name], MD;  Location[facility];  Service: Urology;  Laterality: N/A;    SOCIAL HISTORY:  Social History   Socioeconomic History   Marital status: Single    Spouse name: Not on file   Number of  children: Not on file   Years of education: Not on file   Highest education level: Not on file  Occupational History   Not on file  Tobacco Use   Smoking status: Never   Smokeless tobacco: Never  Vaping Use   Vaping Use: Never used  Substance and Sexual Activity   Alcohol use: Yes    Comment: Drinks beer occasionally    Drug use: No   Sexual  activity: Not Currently  Other Topics Concern   Not on file  Social History Narrative   Not on file   Social Determinants of Health   Financial Resource Strain: Not on file  Food Insecurity: Not on file  Transportation Needs: Not on file  Physical Activity: Not on file  Stress: Not on file  Social Connections: Not on file  Intimate Partner Violence: Not on file    FAMILY HISTORY:  Family History  Problem Relation Age of Onset   Hypertension Mother    Lung cancer Father    Lung cancer Brother    HIV/AIDS Brother    Breast cancer Daughter     CURRENT MEDICATIONS:  Current Outpatient Medications  Medication Sig Dispense Refill   abiraterone acetate (ZYTIGA) 250 MG tablet TAKE 4 TABLETS (1,000 MG TOTAL) BY MOUTH DAILY. TAKE ON AN EMPTY STOMACH 1 HOUR BEFORE OR 2 HOURS AFTER A MEAL 120 tablet 3   amLODipine (NORVASC) 5 MG tablet Take 1 tablet (5 mg total) by mouth daily. 30 tablet 0   docusate sodium (COLACE) 100 MG capsule Take 100 mg by mouth daily.     dronabinol (MARINOL) 5 MG capsule Take 1 capsule (5 mg total) by mouth 2 (two) times daily before a meal. 60 capsule 1   gabapentin (NEURONTIN) 300 MG capsule Take 1 capsule (300 mg total) by mouth at bedtime. 30 capsule 3   Misc. Devices MISC Please provide patient with a rollaider that has a seat. 1 each 0   predniSONE (DELTASONE) 5 MG tablet TAKE 1 TABLET BY MOUTH ONCE A DAY WITH BREAKFAST 30 tablet 6   No current facility-administered medications for this visit.   Facility-Administered Medications Ordered in Other Visits  Medication Dose Route Frequency Provider Last Rate Last Admin   lanreotide acetate (SOMATULINE DEPOT) 120 MG/0.5ML injection             ALLERGIES:  No Known Allergies  PHYSICAL EXAM:  Performance status (ECOG): 1 - Symptomatic but completely ambulatory  There were no vitals filed for this visit. Wt Readings from Last 3 Encounters:  08/22/21 181 lb 9.6 oz (82.4 kg)  06/27/21 179 lb 3.2 oz (81.3  kg)  05/02/21 181 lb 3.2 oz (82.2 kg)   Physical Exam Vitals reviewed.  Constitutional:      Appearance: Normal appearance.  Cardiovascular:     Rate and Rhythm: Normal rate and regular rhythm.     Pulses: Normal pulses.     Heart sounds: Normal heart sounds.  Pulmonary:     Effort: Pulmonary effort is normal.     Breath sounds: Normal breath sounds.  Neurological:     General: No focal deficit present.     Mental Status: He is alert and oriented to person, place, and time.  Psychiatric:        Mood and Affect: Mood normal.        Behavior: Behavior normal.     LABORATORY DATA:  I have reviewed the labs as listed.     Latest Ref Rng &  Units 09/14/2021   10:53 AM 06/20/2021    7:51 AM 04/04/2021    8:43 AM  CBC  WBC 4.0 - 10.5 K/uL 4.4   4.1   4.1    Hemoglobin 13.0 - 17.0 g/dL 11.3   11.3   11.4    Hematocrit 39.0 - 52.0 % 33.3   34.0   34.3    Platelets 150 - 400 K/uL ACLMP   192   200        Latest Ref Rng & Units 09/14/2021   10:53 AM 08/22/2021    9:39 AM 07/25/2021    8:51 AM  CMP  Glucose 70 - 99 mg/dL 116   84   99    BUN 8 - 23 mg/dL $Remove'25   22   18    'UgUdDxM$ Creatinine 0.61 - 1.24 mg/dL 1.58   1.49   1.32    Sodium 135 - 145 mmol/L 140   141   141    Potassium 3.5 - 5.1 mmol/L 3.5   3.9   3.4    Chloride 98 - 111 mmol/L 110   106   107    CO2 22 - 32 mmol/L $RemoveB'26   29   26    'pJARKZbg$ Calcium 8.9 - 10.3 mg/dL 8.9   9.2   9.1    Total Protein 6.5 - 8.1 g/dL 6.4   7.1   7.0    Total Bilirubin 0.3 - 1.2 mg/dL 1.0   1.2   1.0    Alkaline Phos 38 - 126 U/L 45   48   47    AST 15 - 41 U/L $Remo'22   19   17    'LtbPK$ ALT 0 - 44 U/L $Remo'17   15   12      'WwztM$ DIAGNOSTIC IMAGING:  I have independently reviewed the scans and discussed with the patient. No results found.   ASSESSMENT:  1.  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 -6 cycles of docetaxel from 11/19/2017 through 03/05/2018. -Abiraterone and prednisone started on 05/07/2018.   PSA was 4.52.   2.  Bone metastasis: -Denosumab started on 07/21/2018.   3.  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  Last Feraheme on 01/22/2018.   4.  CKD: -Baseline creatinine between 1.5-1.6.   PLAN:  1.  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 - Denies any new onset pains.  No GI side effects. - Reviewed labs today which showed normal LFTs and CBC.  PSA was 0.19 and stable. - Continue Abiraterone 1000 mg daily with prednisone 5 mg daily. - RTC 3 months for follow-up with repeat PSA.   2.  Bone metastasis: - Continue calcium supplements.  Calcium today is 8.9.  Continue denosumab today and every month.   3.  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 - Hemoglobin today is 11.3 and ferritin 198, percent saturation 26.  If any worsening, will consider parenteral iron therapy.   4.  Peripheral neuropathy: - Continue gabapentin.  Neuropathy in the feet stable.   5.  CKD: - Baseline creatinine 1.4-1.6.  Stable.  6.  Hypertension: - Continue Norvasc 5 mg daily.  Blood pressure is 143/89.   Orders placed this encounter:  No orders of the defined types were placed in this encounter.    Derek Jack, MD [facility]  [phone]   I, Thana Ates, am acting as a scribe for Dr. Derek Jack.  I, Derek Jack MD, have reviewed the above documentation for accuracy and completeness, and I agree with the above.

## 2021-09-19 NOTE — Progress Notes (Signed)
Patient is taking Zytiga as prescribed.  He has not missed any doses and reports no side effects at this time.   

## 2021-09-20 ENCOUNTER — Encounter (HOSPITAL_COMMUNITY): Payer: Self-pay | Admitting: Hematology

## 2021-09-21 ENCOUNTER — Other Ambulatory Visit (HOSPITAL_COMMUNITY): Payer: Self-pay

## 2021-10-05 ENCOUNTER — Other Ambulatory Visit (HOSPITAL_COMMUNITY): Payer: Self-pay

## 2021-10-10 ENCOUNTER — Other Ambulatory Visit (HOSPITAL_COMMUNITY): Payer: Self-pay

## 2021-10-12 ENCOUNTER — Other Ambulatory Visit (HOSPITAL_COMMUNITY): Payer: Self-pay

## 2021-10-16 ENCOUNTER — Other Ambulatory Visit (HOSPITAL_COMMUNITY): Payer: Self-pay

## 2021-10-17 ENCOUNTER — Inpatient Hospital Stay (HOSPITAL_COMMUNITY): Payer: Medicaid Other

## 2021-10-17 ENCOUNTER — Inpatient Hospital Stay (HOSPITAL_COMMUNITY): Payer: Medicaid Other | Attending: Hematology

## 2021-10-17 ENCOUNTER — Encounter (HOSPITAL_COMMUNITY): Payer: Self-pay

## 2021-10-17 VITALS — BP 132/80 | HR 70 | Temp 97.0°F | Resp 18

## 2021-10-17 DIAGNOSIS — C61 Malignant neoplasm of prostate: Secondary | ICD-10-CM

## 2021-10-17 DIAGNOSIS — C7951 Secondary malignant neoplasm of bone: Secondary | ICD-10-CM | POA: Insufficient documentation

## 2021-10-17 LAB — COMPREHENSIVE METABOLIC PANEL
ALT: 16 U/L (ref 0–44)
AST: 20 U/L (ref 15–41)
Albumin: 3.8 g/dL (ref 3.5–5.0)
Alkaline Phosphatase: 46 U/L (ref 38–126)
Anion gap: 8 (ref 5–15)
BUN: 20 mg/dL (ref 8–23)
CO2: 25 mmol/L (ref 22–32)
Calcium: 9.1 mg/dL (ref 8.9–10.3)
Chloride: 107 mmol/L (ref 98–111)
Creatinine, Ser: 1.51 mg/dL — ABNORMAL HIGH (ref 0.61–1.24)
GFR, Estimated: 46 mL/min — ABNORMAL LOW (ref >60)
Glucose, Bld: 97 mg/dL (ref 70–99)
Potassium: 3.8 mmol/L (ref 3.5–5.1)
Sodium: 140 mmol/L (ref 135–145)
Total Bilirubin: 1 mg/dL (ref 0.3–1.2)
Total Protein: 6.8 g/dL (ref 6.5–8.1)

## 2021-10-17 MED ORDER — DENOSUMAB 120 MG/1.7ML ~~LOC~~ SOLN
120.0000 mg | Freq: Once | SUBCUTANEOUS | Status: AC
  Administered 2021-10-17: 120 mg via SUBCUTANEOUS
  Filled 2021-10-17: qty 1.7

## 2021-10-18 ENCOUNTER — Other Ambulatory Visit (HOSPITAL_COMMUNITY): Payer: Self-pay | Admitting: Hematology

## 2021-10-18 ENCOUNTER — Other Ambulatory Visit (HOSPITAL_COMMUNITY): Payer: Self-pay

## 2021-10-18 DIAGNOSIS — C61 Malignant neoplasm of prostate: Secondary | ICD-10-CM

## 2021-10-19 ENCOUNTER — Other Ambulatory Visit (HOSPITAL_COMMUNITY): Payer: Self-pay

## 2021-10-19 ENCOUNTER — Other Ambulatory Visit (HOSPITAL_COMMUNITY): Payer: Self-pay | Admitting: Physician Assistant

## 2021-10-19 DIAGNOSIS — C61 Malignant neoplasm of prostate: Secondary | ICD-10-CM

## 2021-10-19 MED ORDER — PREDNISONE 5 MG PO TABS
ORAL_TABLET | Freq: Every day | ORAL | 6 refills | Status: DC
  Filled 2021-10-19: qty 30, 30d supply, fill #0
  Filled 2021-11-21: qty 30, 30d supply, fill #1
  Filled 2021-12-18: qty 30, 30d supply, fill #2
  Filled 2022-01-09: qty 30, 30d supply, fill #3
  Filled 2022-02-13: qty 30, 30d supply, fill #4
  Filled 2022-03-16: qty 30, 30d supply, fill #5
  Filled 2022-04-09: qty 30, 30d supply, fill #6

## 2021-10-20 ENCOUNTER — Other Ambulatory Visit (HOSPITAL_COMMUNITY): Payer: Self-pay

## 2021-11-14 ENCOUNTER — Other Ambulatory Visit (HOSPITAL_COMMUNITY): Payer: Self-pay

## 2021-11-14 ENCOUNTER — Inpatient Hospital Stay (HOSPITAL_COMMUNITY): Payer: Medicaid Other

## 2021-11-14 ENCOUNTER — Inpatient Hospital Stay (HOSPITAL_COMMUNITY): Payer: Medicaid Other | Attending: Hematology

## 2021-11-14 VITALS — BP 135/73 | HR 63 | Temp 97.3°F | Resp 20

## 2021-11-14 DIAGNOSIS — C7951 Secondary malignant neoplasm of bone: Secondary | ICD-10-CM | POA: Insufficient documentation

## 2021-11-14 DIAGNOSIS — C61 Malignant neoplasm of prostate: Secondary | ICD-10-CM

## 2021-11-14 LAB — COMPREHENSIVE METABOLIC PANEL
ALT: 15 U/L (ref 0–44)
AST: 16 U/L (ref 15–41)
Albumin: 3.7 g/dL (ref 3.5–5.0)
Alkaline Phosphatase: 56 U/L (ref 38–126)
Anion gap: 6 (ref 5–15)
BUN: 20 mg/dL (ref 8–23)
CO2: 25 mmol/L (ref 22–32)
Calcium: 8.5 mg/dL — ABNORMAL LOW (ref 8.9–10.3)
Chloride: 109 mmol/L (ref 98–111)
Creatinine, Ser: 1.52 mg/dL — ABNORMAL HIGH (ref 0.61–1.24)
GFR, Estimated: 46 mL/min — ABNORMAL LOW (ref >60)
Glucose, Bld: 84 mg/dL (ref 70–99)
Potassium: 3.6 mmol/L (ref 3.5–5.1)
Sodium: 140 mmol/L (ref 135–145)
Total Bilirubin: 1.2 mg/dL (ref 0.3–1.2)
Total Protein: 6.8 g/dL (ref 6.5–8.1)

## 2021-11-14 MED ORDER — DENOSUMAB 120 MG/1.7ML ~~LOC~~ SOLN
120.0000 mg | Freq: Once | SUBCUTANEOUS | Status: AC
  Administered 2021-11-14: 120 mg via SUBCUTANEOUS
  Filled 2021-11-14: qty 1.7

## 2021-11-14 NOTE — Patient Instructions (Signed)
[facility]  Discharge Instructions: Thank you for choosing [facility] to provide your oncology and hematology care.  If you have a lab appointment with the [facility], please come in thru the Main Entrance and check in at the main information desk.  Wear comfortable clothing and clothing appropriate for easy access to any Portacath or PICC line.   We strive to give you quality time with your provider. You may need to reschedule your appointment if you arrive late (15 or more minutes).  Arriving late affects you and other patients whose appointments are after yours.  Also, if you miss three or more appointments without notifying the office, you may be dismissed from the clinic at the provider's discretion.      For prescription refill requests, have your pharmacy contact our office and allow 72 hours for refills to be completed.    Today you received Xgeva injection.     BELOW ARE SYMPTOMS THAT SHOULD BE REPORTED IMMEDIATELY: *FEVER GREATER THAN 100.4 F (38 C) OR HIGHER *CHILLS OR SWEATING *NAUSEA AND VOMITING THAT IS NOT CONTROLLED WITH YOUR NAUSEA MEDICATION *UNUSUAL SHORTNESS OF BREATH *UNUSUAL BRUISING OR BLEEDING *URINARY PROBLEMS (pain or burning when urinating, or frequent urination) *BOWEL PROBLEMS (unusual diarrhea, constipation, pain near the anus) TENDERNESS IN MOUTH AND THROAT WITH OR WITHOUT PRESENCE OF ULCERS (sore throat, sores in mouth, or a toothache) UNUSUAL RASH, SWELLING OR PAIN  UNUSUAL VAGINAL DISCHARGE OR ITCHING   Items with * indicate a potential emergency and should be followed up as soon as possible or go to the Emergency Department if any problems should occur.  Please show the CHEMOTHERAPY ALERT CARD or IMMUNOTHERAPY ALERT CARD at check-in to the Emergency Department and triage nurse.  Should you have questions after your visit or need to cancel or reschedule your appointment, please contact [facility]  [phone]  and follow the prompts.  Office hours are 8:00 a.m. to 4:30 p.m. Monday - Friday. Please note that voicemails left after 4:00 p.m. may not be returned until the following business day.  We are closed weekends and major holidays. You have access to a nurse at all times for urgent questions. Please call the main number to the clinic [phone] and follow the prompts.  For any non-urgent questions, you may also contact your provider using MyChart. We now offer e-Visits for anyone 55 and older to request care online for non-urgent symptoms. For details visit [email or web address].   Also download the MyChart app! Go to the app store, search "MyChart", open the app, select [facility], and log in with your MyChart username and password.  Masks are optional in the cancer centers. If you would like for your care team to wear a mask while they are taking care of you, please let them know. For doctor visits, patients may have with them one support person who is at least 82 years old. At this time, visitors are not allowed in the infusion area.

## 2021-11-14 NOTE — Progress Notes (Signed)
Carlos Harper presents today for injection per the provider's orders.  Xgeva administration without incident; injection site WNL; see MAR for injection details. Pt denies any tooth or jaw pain. No recent or future dental appointments. Pt reports taking Calcium and Vit D supplements as directed.  Patient tolerated procedure well and without incident.  No questions or complaints noted at this time.  Discharged from clinic ambulatory in stable condition. Alert and oriented x 3. F/U with [facility] as scheduled.

## 2021-11-16 ENCOUNTER — Other Ambulatory Visit (HOSPITAL_COMMUNITY): Payer: Self-pay

## 2021-11-21 ENCOUNTER — Other Ambulatory Visit (HOSPITAL_COMMUNITY): Payer: Self-pay

## 2021-11-22 ENCOUNTER — Other Ambulatory Visit (HOSPITAL_COMMUNITY): Payer: Self-pay

## 2021-12-06 ENCOUNTER — Other Ambulatory Visit: Payer: Self-pay | Admitting: Hematology

## 2021-12-07 ENCOUNTER — Encounter (HOSPITAL_COMMUNITY): Payer: Self-pay | Admitting: Hematology

## 2021-12-08 ENCOUNTER — Other Ambulatory Visit (HOSPITAL_COMMUNITY): Payer: Self-pay

## 2021-12-11 ENCOUNTER — Other Ambulatory Visit (HOSPITAL_COMMUNITY): Payer: Self-pay

## 2021-12-11 ENCOUNTER — Inpatient Hospital Stay: Payer: Medicaid Other

## 2021-12-12 ENCOUNTER — Other Ambulatory Visit: Payer: Self-pay | Admitting: *Deleted

## 2021-12-12 ENCOUNTER — Inpatient Hospital Stay (HOSPITAL_COMMUNITY): Payer: Medicaid Other | Attending: Hematology | Admitting: Hematology

## 2021-12-12 ENCOUNTER — Inpatient Hospital Stay: Payer: Medicaid Other

## 2021-12-12 ENCOUNTER — Inpatient Hospital Stay (HOSPITAL_COMMUNITY): Payer: Medicaid Other

## 2021-12-12 VITALS — BP 146/83 | HR 66 | Temp 97.0°F | Resp 18 | Ht 66.0 in | Wt 172.2 lb

## 2021-12-12 DIAGNOSIS — C61 Malignant neoplasm of prostate: Secondary | ICD-10-CM

## 2021-12-12 DIAGNOSIS — G629 Polyneuropathy, unspecified: Secondary | ICD-10-CM | POA: Insufficient documentation

## 2021-12-12 DIAGNOSIS — D631 Anemia in chronic kidney disease: Secondary | ICD-10-CM | POA: Diagnosis not present

## 2021-12-12 DIAGNOSIS — N189 Chronic kidney disease, unspecified: Secondary | ICD-10-CM | POA: Insufficient documentation

## 2021-12-12 DIAGNOSIS — C7951 Secondary malignant neoplasm of bone: Secondary | ICD-10-CM | POA: Insufficient documentation

## 2021-12-12 DIAGNOSIS — I129 Hypertensive chronic kidney disease with stage 1 through stage 4 chronic kidney disease, or unspecified chronic kidney disease: Secondary | ICD-10-CM | POA: Insufficient documentation

## 2021-12-12 LAB — CBC WITH DIFFERENTIAL/PLATELET
Abs Immature Granulocytes: 0 10*3/uL (ref 0.00–0.07)
Basophils Absolute: 0 10*3/uL (ref 0.0–0.1)
Basophils Relative: 0 %
Eosinophils Absolute: 0 10*3/uL (ref 0.0–0.5)
Eosinophils Relative: 0 %
HCT: 35.1 % — ABNORMAL LOW (ref 39.0–52.0)
Hemoglobin: 11.5 g/dL — ABNORMAL LOW (ref 13.0–17.0)
Immature Granulocytes: 0 %
Lymphocytes Relative: 31 %
Lymphs Abs: 1.5 10*3/uL (ref 0.7–4.0)
MCH: 30.6 pg (ref 26.0–34.0)
MCHC: 32.8 g/dL (ref 30.0–36.0)
MCV: 93.4 fL (ref 80.0–100.0)
Monocytes Absolute: 0.4 10*3/uL (ref 0.1–1.0)
Monocytes Relative: 8 %
Neutro Abs: 3 10*3/uL (ref 1.7–7.7)
Neutrophils Relative %: 61 %
Platelets: 145 10*3/uL — ABNORMAL LOW (ref 150–400)
RBC: 3.76 MIL/uL — ABNORMAL LOW (ref 4.22–5.81)
RDW: 13.7 % (ref 11.5–15.5)
WBC: 5 10*3/uL (ref 4.0–10.5)
nRBC: 0 % (ref 0.0–0.2)

## 2021-12-12 LAB — COMPREHENSIVE METABOLIC PANEL
ALT: 14 U/L (ref 0–44)
AST: 18 U/L (ref 15–41)
Albumin: 3.7 g/dL (ref 3.5–5.0)
Alkaline Phosphatase: 52 U/L (ref 38–126)
Anion gap: 6 (ref 5–15)
BUN: 17 mg/dL (ref 8–23)
CO2: 24 mmol/L (ref 22–32)
Calcium: 8.9 mg/dL (ref 8.9–10.3)
Chloride: 110 mmol/L (ref 98–111)
Creatinine, Ser: 1.41 mg/dL — ABNORMAL HIGH (ref 0.61–1.24)
GFR, Estimated: 50 mL/min — ABNORMAL LOW (ref >60)
Glucose, Bld: 97 mg/dL (ref 70–99)
Potassium: 3.7 mmol/L (ref 3.5–5.1)
Sodium: 140 mmol/L (ref 135–145)
Total Bilirubin: 1 mg/dL (ref 0.3–1.2)
Total Protein: 7 g/dL (ref 6.5–8.1)

## 2021-12-12 LAB — PSA: Prostatic Specific Antigen: 0.22 ng/mL (ref 0.00–4.00)

## 2021-12-12 MED ORDER — DENOSUMAB 120 MG/1.7ML ~~LOC~~ SOLN
120.0000 mg | Freq: Once | SUBCUTANEOUS | Status: AC
  Administered 2021-12-12: 120 mg via SUBCUTANEOUS
  Filled 2021-12-12: qty 1.7

## 2021-12-12 NOTE — Progress Notes (Signed)
Carlos Harper, [place] [address]   CLINIC:  Medical Oncology/Hematology  PCP:  Carlos Harper., PA-C [place] / [place] [address] [phone]   REASON FOR VISIT:  Follow-up for metastatic prostate cancer  PRIOR THERAPY:  1. Bilateral orchiectomy on 09/17/2017. 2. Docetaxel x 6 cycles from 11/19/2017 to 03/05/2018  NGS Results: not done  CURRENT THERAPY: Zytiga 1,000 mg daily; Xgeva monthly  BRIEF ONCOLOGIC HISTORY:  Oncology History  Malignant neoplasm of prostate ([facility])  09/27/2017 Initial Diagnosis   Malignant neoplasm of prostate ([facility])   11/19/2017 - 03/07/2018 Chemotherapy   The patient had pegfilgrastim (NEULASTA) injection 6 mg, 6 mg, Subcutaneous, Once, 6 of 6 cycles Administration: 6 mg (11/21/2017), 6 mg (12/12/2017), 6 mg (01/02/2018), 6 mg (01/24/2018), 6 mg (02/14/2018), 6 mg (03/07/2018) pegfilgrastim (NEULASTA ONPRO KIT) injection 6 mg, 6 mg, Subcutaneous, Once, 2 of 2 cycles DOCEtaxel (TAXOTERE) 140 mg in sodium chloride 0.9 % 250 mL chemo infusion, 75 mg/m2 = 140 mg, Intravenous,  Once, 6 of 6 cycles Administration: 140 mg (11/19/2017), 140 mg (12/10/2017), 140 mg (01/22/2018), 140 mg (12/31/2017), 140 mg (02/12/2018), 140 mg (03/05/2018) ondansetron (ZOFRAN) 8 mg, dexamethasone (DECADRON) 10 mg in sodium chloride 0.9 % 50 mL IVPB, , Intravenous,  Once, 4 of 4 cycles Administration:  (12/31/2017),  (01/22/2018),  (02/12/2018),  (03/05/2018)  for chemotherapy treatment.      CANCER STAGING:  Cancer Staging  No matching staging information was found for the patient.  INTERVAL HISTORY:  Carlos Harper, a 81 y.o. male, returns for routine follow-up of his metastatic prostate cancer. Carlos Harper was last seen on 06/27/2021.   Today he reports feeling good. He is taking Zytiga and reports tolerating it well. He denies n/v/d/c, new pains, and jaw pains. His appetite is good.   REVIEW OF SYSTEMS:  Review of Systems   Constitutional:  Negative for appetite change and fatigue.  Gastrointestinal:  Negative for constipation, diarrhea, nausea and vomiting.  Musculoskeletal:  Negative for arthralgias.  Neurological:  Positive for dizziness. Negative for numbness.  All other systems reviewed and are negative.   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 ([facility]) 09/27/2017   Sickle cell trait ([facility])    Vertigo    Past Surgical History:  Procedure Laterality Date   CYSTOSCOPY WITH FULGERATION  09/17/2017   Procedure: CYSTOSCOPY WITH FULGERATION OF BLADDER NECK;  Surgeon: Carlos Seal, MD;  [facility];  Service: Urology;;   CYSTOSCOPY WITH STENT PLACEMENT Right 09/17/2017   Procedure: CYSTOSCOPY WITH RIGHT RETROGRADE PYELOGRAM ATTEMPTED STENT PLACEMENT;  Surgeon: Carlos Seal, MD;  [facility];  Service: Urology;  Laterality: Right;   left leg surgery due to [place] Bilateral 09/17/2017   Procedure: ORCHIECTOMY;  Surgeon: Carlos Seal, MD;  [facility];  Service: Urology;  Laterality: Bilateral;   PORTACATH PLACEMENT Left 10/14/2017   Procedure: INSERTION PORT-A-CATH;  Surgeon: Carlos Signs, MD;  Location: [facility];  Service: General;  Laterality: Left;   PROSTATE BIOPSY N/A 09/17/2017   Procedure: BIOPSY TRANSRECTAL ULTRASONIC PROSTATE (TUBP);  Surgeon: Carlos Seal, MD;  Location[facility];  Service: Urology;  Laterality: N/A;    SOCIAL HISTORY:  Social History   Socioeconomic History   Marital status: Single    Spouse name: Not on file   Number of  children: Not on file   Years of education: Not on file   Highest education level: Not on file  Occupational History   Not on file  Tobacco Use   Smoking status: Never   Smokeless tobacco: Never  Vaping Use   Vaping Use: Never used  Substance and Sexual Activity   Alcohol use: Yes    Comment: Drinks beer occasionally    Drug  use: No   Sexual activity: Not Currently  Other Topics Concern   Not on file  Social History Narrative   Not on file   Social Determinants of Health   Financial Resource Strain: Low Risk  (03/31/2020)   Overall Financial Resource Strain (CARDIA)    Difficulty of Paying Living Expenses: Not hard at all  Food Insecurity: No Food Insecurity (03/31/2020)   Hunger Vital Sign    Worried About Running Out of Food in the Last Year: Never true    Ran Out of Food in the Last Year: Never true  Transportation Needs: No Transportation Needs (03/31/2020)   PRAPARE - Hydrologist (Medical): No    Lack of Transportation (Non-Medical): No  Physical Activity: Inactive (03/31/2020)   Exercise Vital Sign    Days of Exercise per Week: 0 days    Minutes of Exercise per Session: 0 min  Stress: No Stress Concern Present (03/31/2020)   [place]    Feeling of Stress : Not at all  Social Connections: Moderately Isolated (03/31/2020)   Social Connection and Isolation Panel [NHANES]    Frequency of Communication with Friends and Family: More than three times a week    Frequency of Social Gatherings with Friends and Family: More than three times a week    Attends Religious Services: More than 4 times per year    Active Member of [employer] or Organizations: No    Attends Archivist Meetings: Never    Marital Status: Divorced  Human resources officer Violence: Not At Risk (03/31/2020)   Humiliation, Afraid, Rape, and Kick questionnaire    Fear of Current or Ex-Partner: No    Emotionally Abused: No    Physically Abused: No    Sexually Abused: No    FAMILY HISTORY:  Family History  Problem Relation Age of Onset   Hypertension Mother    Lung cancer Father    Lung cancer Brother    HIV/AIDS Brother    Breast cancer Daughter     CURRENT MEDICATIONS:  Current Outpatient Medications  Medication Sig Dispense Refill    abiraterone acetate (ZYTIGA) 250 MG tablet TAKE 4 TABLETS (1,000 MG TOTAL) BY MOUTH DAILY. TAKE ON AN EMPTY STOMACH 1 HOUR BEFORE OR 2 HOURS AFTER A MEAL 120 tablet 3   docusate sodium (COLACE) 100 MG capsule Take 100 mg by mouth daily.     dronabinol (MARINOL) 5 MG capsule Take 1 capsule (5 mg total) by mouth 2 (two) times daily before a meal. 60 capsule 1   gabapentin (NEURONTIN) 300 MG capsule TAKE 1 CAPSULE BY MOUTH EVERYDAY AT BEDTIME 30 capsule 1   Misc. Devices MISC Please provide patient with a rollaider that has a seat. 1 each 0   predniSONE (DELTASONE) 5 MG tablet TAKE 1 TABLET BY MOUTH ONCE A DAY WITH BREAKFAST 30 tablet 6   amLODipine (NORVASC) 5 MG tablet TAKE 1 TABLET (5 MG TOTAL) BY MOUTH DAILY. 30 tablet 6   No current facility-administered medications for this  visit.   Facility-Administered Medications Ordered in Other Visits  Medication Dose Route Frequency Provider Last Rate Last Admin   lanreotide acetate (SOMATULINE DEPOT) 120 MG/0.5ML injection             ALLERGIES:  No Known Allergies  PHYSICAL EXAM:  Performance status (ECOG): 1 - Symptomatic but completely ambulatory  Vitals:   12/12/21 1054  BP: (!) 146/83  Pulse: 66  Resp: 18  Temp: (!) 97 F (36.1 C)  SpO2: 97%   Wt Readings from Last 3 Encounters:  12/12/21 172 lb 3.2 oz (78.1 kg)  09/19/21 176 lb 14.4 oz (80.2 kg)  08/22/21 181 lb 9.6 oz (82.4 kg)   Physical Exam Vitals reviewed.  Constitutional:      Appearance: Normal appearance.  Cardiovascular:     Rate and Rhythm: Normal rate and regular rhythm.     Pulses: Normal pulses.     Heart sounds: Normal heart sounds.  Pulmonary:     Effort: Pulmonary effort is normal.     Breath sounds: Normal breath sounds.  Neurological:     General: No focal deficit present.     Mental Status: He is alert and oriented to person, place, and time.  Psychiatric:        Mood and Affect: Mood normal.        Behavior: Behavior normal.      LABORATORY  DATA:  I have reviewed the labs as listed.     Latest Ref Rng & Units 12/12/2021    9:49 AM 09/14/2021   10:53 AM 06/20/2021    7:51 AM  CBC  WBC 4.0 - 10.5 K/uL 5.0  4.4  4.1   Hemoglobin 13.0 - 17.0 g/dL 11.5  11.3  11.3   Hematocrit 39.0 - 52.0 % 35.1  33.3  34.0   Platelets 150 - 400 K/uL 145  ACLMP  192       Latest Ref Rng & Units 12/12/2021    9:49 AM 11/14/2021    9:06 AM 10/17/2021   11:28 AM  CMP  Glucose 70 - 99 mg/dL 97  84  97   BUN 8 - 23 mg/dL '17  20  20   ' Creatinine 0.61 - 1.24 mg/dL 1.41  1.52  1.51   Sodium 135 - 145 mmol/L 140  140  140   Potassium 3.5 - 5.1 mmol/L 3.7  3.6  3.8   Chloride 98 - 111 mmol/L 110  109  107   CO2 22 - 32 mmol/L '24  25  25   ' Calcium 8.9 - 10.3 mg/dL 8.9  8.5  9.1   Total Protein 6.5 - 8.1 g/dL 7.0  6.8  6.8   Total Bilirubin 0.3 - 1.2 mg/dL 1.0  1.2  1.0   Alkaline Phos 38 - 126 U/L 52  56  46   AST 15 - 41 U/L '18  16  20   ' ALT 0 - 44 U/L '14  15  16     ' DIAGNOSTIC IMAGING:  I have independently reviewed the scans and discussed with the patient. No results found.   ASSESSMENT:  1.  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 -6 cycles of docetaxel from 11/19/2017 through 03/05/2018. -Abiraterone and prednisone started on 05/07/2018.  PSA was 4.52.   2.  Bone metastasis: -Denosumab started on 07/21/2018.      PLAN:  1.  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 - Labs today shows normal LFTs.  Creatinine is 1.41 and stable.  Last PSA was 0.19.  PSA from today is pending.  Blood pressure is within normal limits.  Potassium was normal. - Continue Abiraterone 1000 mg and prednisone 5 mg daily.  RTC 3 months for follow-up with repeat labs and PSA.   2.  Bone metastasis: -Calcium today is 8.9.  Continue calcium supplements.  Continue denosumab monthly.   3.  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 - Hemoglobin is stable at 11.5.  We will plan to check ferritin and iron panel at next visit.   4.  Peripheral neuropathy: - Continue gabapentin.  Neuropathy in the feet is stable.   5.  CKD: - Baseline creatinine 1.4-1.6 and stable.  6.  Hypertension: - Continue Norvasc 5 mg daily.  Blood pressure today is 146/83.   Orders placed this encounter:  No orders of the defined types were placed in this encounter.    Derek Jack, MD [facility] [phone]

## 2021-12-12 NOTE — Patient Instructions (Addendum)
[place] at [facility] Discharge Instructions   You were seen and examined today by Dr. Delton Coombes.  He reviewed your lab work which is normal/stable.  Continue Zytiga as prescribed.   We will see you back in 3 months with labs a day before.    Thank you for choosing [place] at [facility] to provide your oncology and hematology care.  To afford each patient quality time with our provider, please arrive at least 15 minutes before your scheduled appointment time.   If you have a lab appointment with the [facility] please come in thru the Main Entrance and check in at the main information desk.  You need to re-schedule your appointment should you arrive 10 or more minutes late.  We strive to give you quality time with our providers, and arriving late affects you and other patients whose appointments are after yours.  Also, if you no show three or more times for appointments you may be dismissed from the clinic at the providers discretion.     Again, thank you for choosing [facility].  Our hope is that these requests will decrease the amount of time that you wait before being seen by our physicians.       _____________________________________________________________  Should you have questions after your visit to [facility], please contact our office at [phone] and follow the prompts.  Our office hours are 8:00 a.m. and 4:30 p.m. Monday - Friday.  Please note that voicemails left after 4:00 p.m. may not be returned until the following business day.  We are closed weekends and major holidays.  You do have access to a nurse 24-7, just call the main number to the clinic [phone] and do not press any options, hold on the line and a nurse will answer the phone.    For prescription refill requests, have your pharmacy contact our office and allow 72 hours.    Due to Covid, you will need to wear a mask upon  entering the hospital. If you do not have a mask, a mask will be given to you at the Main Entrance upon arrival. For doctor visits, patients may have 1 support person age 37 or older with them. For treatment visits, patients can not have anyone with them due to social distancing guidelines and our immunocompromised population.

## 2021-12-12 NOTE — Progress Notes (Signed)
Patient is taking Zytiga as prescribed.  He has not missed any doses and reports no side effects at this time.   

## 2021-12-12 NOTE — Patient Instructions (Signed)
[place]  Discharge Instructions: Thank you for choosing [facility] to provide your oncology and hematology care.  If you have a lab appointment with the [facility], please come in thru the Main Entrance and check in at the main information desk.  Wear comfortable clothing and clothing appropriate for easy access to any Portacath or PICC line.   We strive to give you quality time with your provider. You may need to reschedule your appointment if you arrive late (15 or more minutes).  Arriving late affects you and other patients whose appointments are after yours.  Also, if you miss three or more appointments without notifying the office, you may be dismissed from the clinic at the provider's discretion.      For prescription refill requests, have your pharmacy contact our office and allow 72 hours for refills to be completed.    Today you received the following chemotherapy and/or immunotherapy agents xgeva.  Denosumab Injection (Oncology) What is this medication? DENOSUMAB (den oh SUE mab) prevents weakened bones caused by cancer. It may also be used to treat noncancerous bone tumors that cannot be removed by surgery. It can also be used to treat high calcium levels in the blood caused by cancer. It works by blocking a protein that causes bones to break down quickly. This slows down the release of calcium from bones, which lowers calcium levels in your blood. It also makes your bones stronger and less likely to break (fracture). This medicine may be used for other purposes; ask your health care provider or pharmacist if you have questions. COMMON BRAND NAME(S): XGEVA What should I tell my care team before I take this medication? They need to know if you have any of these conditions: Dental disease Having surgery or tooth extraction Infection Kidney disease Low levels of calcium or vitamin D in the blood Malnutrition On hemodialysis Skin conditions  or sensitivity Thyroid or parathyroid disease An unusual reaction to denosumab, other medications, foods, dyes, or preservatives Pregnant or trying to get pregnant Breast-feeding How should I use this medication? This medication is for injection under the skin. It is given by your care team in a hospital or clinic setting. A special MedGuide will be given to you before each treatment. Be sure to read this information carefully each time. Talk to your care team about the use of this medication in children. While it may be prescribed for children as young as 13 years for selected conditions, precautions do apply. Overdosage: If you think you have taken too much of this medicine contact a poison control center or emergency room at once. NOTE: This medicine is only for you. Do not share this medicine with others. What if I miss a dose? Keep appointments for follow-up doses. It is important not to miss your dose. Call your care team if you are unable to keep an appointment. What may interact with this medication? Do not take this medication with any of the following: Other medications containing denosumab This medication may also interact with the following: Medications that lower your chance of fighting infection Steroid medications, such as prednisone or cortisone This list may not describe all possible interactions. Give your health care provider a list of all the medicines, herbs, non-prescription drugs, or dietary supplements you use. Also tell them if you smoke, drink alcohol, or use illegal drugs. Some items may interact with your medicine. What should I watch for while using this medication? Your condition will  be monitored carefully while you are receiving this medication. You may need blood work while taking this medication. This medication may increase your risk of getting an infection. Call your care team for advice if you get a fever, chills, sore throat, or other symptoms of a cold or  flu. Do not treat yourself. Try to avoid being around people who are sick. You should make sure you get enough calcium and vitamin D while you are taking this medication, unless your care team tells you not to. Discuss the foods you eat and the vitamins you take with your care team. Some people who take this medication have severe bone, joint, or muscle pain. This medication may also increase your risk for jaw problems or a broken thigh bone. Tell your care team right away if you have severe pain in your jaw, bones, joints, or muscles. Tell your care team if you have any pain that does not go away or that gets worse. Talk to your care team if you may be pregnant. Serious birth defects can occur if you take this medication during pregnancy and for 5 months after the last dose. You will need a negative pregnancy test before starting this medication. Contraception is recommended while taking this medication and for 5 months after the last dose. Your care team can help you find the option that works for you. What side effects may I notice from receiving this medication? Side effects that you should report to your care team as soon as possible: Allergic reactions--skin rash, itching, hives, swelling of the face, lips, tongue, or throat Bone, joint, or muscle pain Low calcium level--muscle pain or cramps, confusion, tingling, or numbness in the hands or feet Osteonecrosis of the jaw--pain, swelling, or redness in the mouth, numbness of the jaw, poor healing after dental work, unusual discharge from the mouth, visible bones in the mouth Side effects that usually do not require medical attention (report to your care team if they continue or are bothersome): Cough Diarrhea Fatigue Headache Nausea This list may not describe all possible side effects. Call your doctor for medical advice about side effects. You may report side effects to FDA at 1-800-FDA-1088. Where should I keep my medication? This medication  is given in a hospital or clinic. It will not be stored at home. NOTE: This sheet is a summary. It may not cover all possible information. If you have questions about this medicine, talk to your doctor, pharmacist, or health care provider.  2023 Elsevier/Gold Standard (2021-08-28 00:00:00)        To help prevent nausea and vomiting after your treatment, we encourage you to take your nausea medication as directed.  BELOW ARE SYMPTOMS THAT SHOULD BE REPORTED IMMEDIATELY: *FEVER GREATER THAN 100.4 F (38 C) OR HIGHER *CHILLS OR SWEATING *NAUSEA AND VOMITING THAT IS NOT CONTROLLED WITH YOUR NAUSEA MEDICATION *UNUSUAL SHORTNESS OF BREATH *UNUSUAL BRUISING OR BLEEDING *URINARY PROBLEMS (pain or burning when urinating, or frequent urination) *BOWEL PROBLEMS (unusual diarrhea, constipation, pain near the anus) TENDERNESS IN MOUTH AND THROAT WITH OR WITHOUT PRESENCE OF ULCERS (sore throat, sores in mouth, or a toothache) UNUSUAL RASH, SWELLING OR PAIN  UNUSUAL VAGINAL DISCHARGE OR ITCHING   Items with * indicate a potential emergency and should be followed up as soon as possible or go to the Emergency Department if any problems should occur.  Please show the CHEMOTHERAPY ALERT CARD or IMMUNOTHERAPY ALERT CARD at check-in to the Emergency Department and triage nurse.  Should you have   questions after your visit or need to cancel or reschedule your appointment, please contact [facility] [phone]  and follow the prompts.  Office hours are 8:00 a.m. to 4:30 p.m. Monday - Friday. Please note that voicemails left after 4:00 p.m. may not be returned until the following business day.  We are closed weekends and major holidays. You have access to a nurse at all times for urgent questions. Please call the main number to the clinic [phone] and follow the prompts.  For any non-urgent questions, you may also contact your provider using MyChart. We now offer e-Visits for anyone 18  and older to request care online for non-urgent symptoms. For details visit [email or web address].   Also download the MyChart app! Go to the app store, search "MyChart", open the app, select [place], and log in with your MyChart username and password.  Masks are optional in the cancer centers. If you would like for your care team to wear a mask while they are taking care of you, please let them know. You may have one support person who is at least 82 years old accompany you for your appointments.  

## 2021-12-12 NOTE — Progress Notes (Signed)
Patient taking calcium as directed.  Denied tooth, jaw, and leg pain.  No recent or upcoming dental visits.  Labs reviewed.  Patient tolerated injection with no complaints voiced.  See MAR for details.  Patient stable during and after injection.  Site clean and dry with no bruising or swelling noted.  Band aid applied.  Vss with discharge and left in satisfactory condition with no s/s of distress noted.   

## 2021-12-15 ENCOUNTER — Other Ambulatory Visit (HOSPITAL_COMMUNITY): Payer: Self-pay

## 2021-12-18 ENCOUNTER — Other Ambulatory Visit (HOSPITAL_COMMUNITY): Payer: Self-pay

## 2022-01-09 ENCOUNTER — Other Ambulatory Visit (HOSPITAL_COMMUNITY): Payer: Self-pay | Admitting: Physician Assistant

## 2022-01-09 ENCOUNTER — Inpatient Hospital Stay: Payer: Medicaid Other | Attending: Hematology

## 2022-01-09 ENCOUNTER — Other Ambulatory Visit (HOSPITAL_COMMUNITY): Payer: Self-pay

## 2022-01-09 ENCOUNTER — Inpatient Hospital Stay: Payer: Medicaid Other

## 2022-01-09 VITALS — BP 137/81 | HR 66 | Temp 98.6°F | Resp 16

## 2022-01-09 DIAGNOSIS — C61 Malignant neoplasm of prostate: Secondary | ICD-10-CM | POA: Insufficient documentation

## 2022-01-09 DIAGNOSIS — C7951 Secondary malignant neoplasm of bone: Secondary | ICD-10-CM | POA: Diagnosis present

## 2022-01-09 DIAGNOSIS — C779 Secondary and unspecified malignant neoplasm of lymph node, unspecified: Secondary | ICD-10-CM | POA: Insufficient documentation

## 2022-01-09 LAB — COMPREHENSIVE METABOLIC PANEL
ALT: 14 U/L (ref 0–44)
AST: 20 U/L (ref 15–41)
Albumin: 3.7 g/dL (ref 3.5–5.0)
Alkaline Phosphatase: 49 U/L (ref 38–126)
Anion gap: 7 (ref 5–15)
BUN: 20 mg/dL (ref 8–23)
CO2: 23 mmol/L (ref 22–32)
Calcium: 9.1 mg/dL (ref 8.9–10.3)
Chloride: 109 mmol/L (ref 98–111)
Creatinine, Ser: 1.39 mg/dL — ABNORMAL HIGH (ref 0.61–1.24)
GFR, Estimated: 51 mL/min — ABNORMAL LOW (ref >60)
Glucose, Bld: 93 mg/dL (ref 70–99)
Potassium: 3.6 mmol/L (ref 3.5–5.1)
Sodium: 139 mmol/L (ref 135–145)
Total Bilirubin: 1.2 mg/dL (ref 0.3–1.2)
Total Protein: 6.7 g/dL (ref 6.5–8.1)

## 2022-01-09 MED ORDER — DENOSUMAB 120 MG/1.7ML ~~LOC~~ SOLN
120.0000 mg | Freq: Once | SUBCUTANEOUS | Status: AC
  Administered 2022-01-09: 120 mg via SUBCUTANEOUS
  Filled 2022-01-09: qty 1.7

## 2022-01-09 NOTE — Patient Instructions (Signed)
[facility]  Discharge Instructions: Thank you for choosing [facility] to provide your oncology and hematology care.  If you have a lab appointment with the Cancer Center, please come in thru the Main Entrance and check in at the main information desk.  Wear comfortable clothing and clothing appropriate for easy access to any Portacath or PICC line.   We strive to give you quality time with your provider. You may need to reschedule your appointment if you arrive late (15 or more minutes).  Arriving late affects you and other patients whose appointments are after yours.  Also, if you miss three or more appointments without notifying the office, you may be dismissed from the clinic at the provider's discretion.      For prescription refill requests, have your pharmacy contact our office and allow 72 hours for refills to be completed.    Today you received the following chemotherapy and/or immunotherapy agents Xgeva.  Denosumab Injection (Oncology) What is this medication? DENOSUMAB (den oh SUE mab) prevents weakened bones caused by cancer. It may also be used to treat noncancerous bone tumors that cannot be removed by surgery. It can also be used to treat high calcium levels in the blood caused by cancer. It works by blocking a protein that causes bones to break down quickly. This slows down the release of calcium from bones, which lowers calcium levels in your blood. It also makes your bones stronger and less likely to break (fracture). This medicine may be used for other purposes; ask your health care provider or pharmacist if you have questions. COMMON BRAND NAME(S): XGEVA What should I tell my care team before I take this medication? They need to know if you have any of these conditions: Dental disease Having surgery or tooth extraction Infection Kidney disease Low levels of calcium or vitamin D in the blood Malnutrition On hemodialysis Skin conditions  or sensitivity Thyroid or parathyroid disease An unusual reaction to denosumab, other medications, foods, dyes, or preservatives Pregnant or trying to get pregnant Breast-feeding How should I use this medication? This medication is for injection under the skin. It is given by your care team in a hospital or clinic setting. A special MedGuide will be given to you before each treatment. Be sure to read this information carefully each time. Talk to your care team about the use of this medication in children. While it may be prescribed for children as young as 13 years for selected conditions, precautions do apply. Overdosage: If you think you have taken too much of this medicine contact a poison control center or emergency room at once. NOTE: This medicine is only for you. Do not share this medicine with others. What if I miss a dose? Keep appointments for follow-up doses. It is important not to miss your dose. Call your care team if you are unable to keep an appointment. What may interact with this medication? Do not take this medication with any of the following: Other medications containing denosumab This medication may also interact with the following: Medications that lower your chance of fighting infection Steroid medications, such as prednisone or cortisone This list may not describe all possible interactions. Give your health care provider a list of all the medicines, herbs, non-prescription drugs, or dietary supplements you use. Also tell them if you smoke, drink alcohol, or use illegal drugs. Some items may interact with your medicine. What should I watch for while using this medication? Your condition will   be monitored carefully while you are receiving this medication. You may need blood work while taking this medication. This medication may increase your risk of getting an infection. Call your care team for advice if you get a fever, chills, sore throat, or other symptoms of a cold or  flu. Do not treat yourself. Try to avoid being around people who are sick. You should make sure you get enough calcium and vitamin D while you are taking this medication, unless your care team tells you not to. Discuss the foods you eat and the vitamins you take with your care team. Some people who take this medication have severe bone, joint, or muscle pain. This medication may also increase your risk for jaw problems or a broken thigh bone. Tell your care team right away if you have severe pain in your jaw, bones, joints, or muscles. Tell your care team if you have any pain that does not go away or that gets worse. Talk to your care team if you may be pregnant. Serious birth defects can occur if you take this medication during pregnancy and for 5 months after the last dose. You will need a negative pregnancy test before starting this medication. Contraception is recommended while taking this medication and for 5 months after the last dose. Your care team can help you find the option that works for you. What side effects may I notice from receiving this medication? Side effects that you should report to your care team as soon as possible: Allergic reactions--skin rash, itching, hives, swelling of the face, lips, tongue, or throat Bone, joint, or muscle pain Low calcium level--muscle pain or cramps, confusion, tingling, or numbness in the hands or feet Osteonecrosis of the jaw--pain, swelling, or redness in the mouth, numbness of the jaw, poor healing after dental work, unusual discharge from the mouth, visible bones in the mouth Side effects that usually do not require medical attention (report to your care team if they continue or are bothersome): Cough Diarrhea Fatigue Headache Nausea This list may not describe all possible side effects. Call your doctor for medical advice about side effects. You may report side effects to FDA at 1-800-FDA-1088. Where should I keep my medication? This medication  is given in a hospital or clinic. It will not be stored at home. NOTE: This sheet is a summary. It may not cover all possible information. If you have questions about this medicine, talk to your doctor, pharmacist, or health care provider.  2023 Elsevier/Gold Standard (2021-08-28 00:00:00)        To help prevent nausea and vomiting after your treatment, we encourage you to take your nausea medication as directed.  BELOW ARE SYMPTOMS THAT SHOULD BE REPORTED IMMEDIATELY: *FEVER GREATER THAN 100.4 F (38 C) OR HIGHER *CHILLS OR SWEATING *NAUSEA AND VOMITING THAT IS NOT CONTROLLED WITH YOUR NAUSEA MEDICATION *UNUSUAL SHORTNESS OF BREATH *UNUSUAL BRUISING OR BLEEDING *URINARY PROBLEMS (pain or burning when urinating, or frequent urination) *BOWEL PROBLEMS (unusual diarrhea, constipation, pain near the anus) TENDERNESS IN MOUTH AND THROAT WITH OR WITHOUT PRESENCE OF ULCERS (sore throat, sores in mouth, or a toothache) UNUSUAL RASH, SWELLING OR PAIN  UNUSUAL VAGINAL DISCHARGE OR ITCHING   Items with * indicate a potential emergency and should be followed up as soon as possible or go to the Emergency Department if any problems should occur.  Please show the CHEMOTHERAPY ALERT CARD or IMMUNOTHERAPY ALERT CARD at check-in to the Emergency Department and triage nurse.  Should you have   questions after your visit or need to cancel or reschedule your appointment, please contact [facility] [phone]  and follow the prompts.  Office hours are 8:00 a.m. to 4:30 p.m. Monday - Friday. Please note that voicemails left after 4:00 p.m. may not be returned until the following business day.  We are closed weekends and major holidays. You have access to a nurse at all times for urgent questions. Please call the main number to the clinic [phone] and follow the prompts.  For any non-urgent questions, you may also contact your provider using MyChart. We now offer e-Visits for anyone 18  and older to request care online for non-urgent symptoms. For details visit [email or web address].   Also download the MyChart app! Go to the app store, search "MyChart", open the app, select [place], and log in with your MyChart username and password.  Masks are optional in the cancer centers. If you would like for your care team to wear a mask while they are taking care of you, please let them know. You may have one support person who is at least 82 years old accompany you for your appointments.  

## 2022-01-09 NOTE — Progress Notes (Signed)
Carlos Harper presents today for injection per the provider's orders.  Xgeva administration without incident; injection site WNL; see MAR for injection details.  No complaints at this time. Discharged from clinic ambulatory in stable condition. Alert and oriented x 3. F/U with [facility] as scheduled.

## 2022-01-10 ENCOUNTER — Other Ambulatory Visit (HOSPITAL_COMMUNITY): Payer: Self-pay

## 2022-01-10 ENCOUNTER — Other Ambulatory Visit (HOSPITAL_COMMUNITY): Payer: Self-pay | Admitting: Physician Assistant

## 2022-01-10 DIAGNOSIS — C61 Malignant neoplasm of prostate: Secondary | ICD-10-CM

## 2022-01-11 ENCOUNTER — Other Ambulatory Visit (HOSPITAL_COMMUNITY): Payer: Self-pay

## 2022-01-11 ENCOUNTER — Other Ambulatory Visit (HOSPITAL_COMMUNITY): Payer: Self-pay | Admitting: Physician Assistant

## 2022-01-11 DIAGNOSIS — C61 Malignant neoplasm of prostate: Secondary | ICD-10-CM

## 2022-01-12 ENCOUNTER — Other Ambulatory Visit (HOSPITAL_COMMUNITY): Payer: Self-pay

## 2022-01-12 ENCOUNTER — Other Ambulatory Visit (HOSPITAL_COMMUNITY): Payer: Self-pay | Admitting: Hematology

## 2022-01-12 DIAGNOSIS — C61 Malignant neoplasm of prostate: Secondary | ICD-10-CM

## 2022-01-15 ENCOUNTER — Other Ambulatory Visit (HOSPITAL_COMMUNITY): Payer: Self-pay

## 2022-01-15 ENCOUNTER — Encounter (HOSPITAL_COMMUNITY): Payer: Self-pay | Admitting: Hematology

## 2022-01-15 ENCOUNTER — Other Ambulatory Visit: Payer: Self-pay | Admitting: *Deleted

## 2022-01-15 MED ORDER — ABIRATERONE ACETATE 250 MG PO TABS
ORAL_TABLET | Freq: Every day | ORAL | 3 refills | Status: DC
  Filled 2022-01-15: qty 120, 30d supply, fill #0
  Filled 2022-02-13: qty 120, 30d supply, fill #1
  Filled 2022-03-16: qty 120, 30d supply, fill #2
  Filled 2022-04-09: qty 120, 30d supply, fill #3

## 2022-01-15 NOTE — Telephone Encounter (Signed)
Xytiga refill approved.  Patient tolerating and is to continue therapy.

## 2022-01-16 ENCOUNTER — Other Ambulatory Visit (HOSPITAL_COMMUNITY): Payer: Self-pay

## 2022-01-17 ENCOUNTER — Other Ambulatory Visit (HOSPITAL_COMMUNITY): Payer: Self-pay

## 2022-02-06 ENCOUNTER — Inpatient Hospital Stay: Payer: Medicaid Other | Attending: Hematology

## 2022-02-06 ENCOUNTER — Inpatient Hospital Stay: Payer: Medicaid Other

## 2022-02-06 VITALS — BP 130/80 | HR 110 | Temp 99.0°F | Resp 16

## 2022-02-06 DIAGNOSIS — C61 Malignant neoplasm of prostate: Secondary | ICD-10-CM

## 2022-02-06 DIAGNOSIS — C7951 Secondary malignant neoplasm of bone: Secondary | ICD-10-CM | POA: Diagnosis present

## 2022-02-06 LAB — COMPREHENSIVE METABOLIC PANEL
ALT: 15 U/L (ref 0–44)
AST: 21 U/L (ref 15–41)
Albumin: 3.8 g/dL (ref 3.5–5.0)
Alkaline Phosphatase: 46 U/L (ref 38–126)
Anion gap: 8 (ref 5–15)
BUN: 19 mg/dL (ref 8–23)
CO2: 24 mmol/L (ref 22–32)
Calcium: 9 mg/dL (ref 8.9–10.3)
Chloride: 107 mmol/L (ref 98–111)
Creatinine, Ser: 1.53 mg/dL — ABNORMAL HIGH (ref 0.61–1.24)
GFR, Estimated: 45 mL/min — ABNORMAL LOW (ref >60)
Glucose, Bld: 96 mg/dL (ref 70–99)
Potassium: 3.7 mmol/L (ref 3.5–5.1)
Sodium: 139 mmol/L (ref 135–145)
Total Bilirubin: 1.2 mg/dL (ref 0.3–1.2)
Total Protein: 6.9 g/dL (ref 6.5–8.1)

## 2022-02-06 MED ORDER — DENOSUMAB 120 MG/1.7ML ~~LOC~~ SOLN
120.0000 mg | Freq: Once | SUBCUTANEOUS | Status: AC
  Administered 2022-02-06: 120 mg via SUBCUTANEOUS
  Filled 2022-02-06: qty 1.7

## 2022-02-06 NOTE — Patient Instructions (Signed)
[facility]  Discharge Instructions: Thank you for choosing [facility] to provide your oncology and hematology care.  If you have a lab appointment with the Cancer Center, please come in thru the Main Entrance and check in at the main information desk.  Wear comfortable clothing and clothing appropriate for easy access to any Portacath or PICC line.   We strive to give you quality time with your provider. You may need to reschedule your appointment if you arrive late (15 or more minutes).  Arriving late affects you and other patients whose appointments are after yours.  Also, if you miss three or more appointments without notifying the office, you may be dismissed from the clinic at the provider's discretion.      For prescription refill requests, have your pharmacy contact our office and allow 72 hours for refills to be completed.     To help prevent nausea and vomiting after your treatment, we encourage you to take your nausea medication as directed.  BELOW ARE SYMPTOMS THAT SHOULD BE REPORTED IMMEDIATELY: *FEVER GREATER THAN 100.4 F (38 C) OR HIGHER *CHILLS OR SWEATING *NAUSEA AND VOMITING THAT IS NOT CONTROLLED WITH YOUR NAUSEA MEDICATION *UNUSUAL SHORTNESS OF BREATH *UNUSUAL BRUISING OR BLEEDING *URINARY PROBLEMS (pain or burning when urinating, or frequent urination) *BOWEL PROBLEMS (unusual diarrhea, constipation, pain near the anus) TENDERNESS IN MOUTH AND THROAT WITH OR WITHOUT PRESENCE OF ULCERS (sore throat, sores in mouth, or a toothache) UNUSUAL RASH, SWELLING OR PAIN  UNUSUAL VAGINAL DISCHARGE OR ITCHING   Items with * indicate a potential emergency and should be followed up as soon as possible or go to the Emergency Department if any problems should occur.  Please show the CHEMOTHERAPY ALERT CARD or IMMUNOTHERAPY ALERT CARD at check-in to the Emergency Department and triage nurse.  Should you have questions after your visit or need to  cancel or reschedule your appointment, please contact [facility] [phone]  and follow the prompts.  Office hours are 8:00 a.m. to 4:30 p.m. Monday - Friday. Please note that voicemails left after 4:00 p.m. may not be returned until the following business day.  We are closed weekends and major holidays. You have access to a nurse at all times for urgent questions. Please call the main number to the clinic [phone] and follow the prompts.  For any non-urgent questions, you may also contact your provider using MyChart. We now offer e-Visits for anyone 18 and older to request care online for non-urgent symptoms. For details visit [email or web address].   Also download the MyChart app! Go to the app store, search "MyChart", open the app, select [facility], and log in with your MyChart username and password.  Masks are optional in the cancer centers. If you would like for your care team to wear a mask while they are taking care of you, please let them know. You may have one support person who is at least 82 years old accompany you for your appointments.  

## 2022-02-06 NOTE — Progress Notes (Signed)
Patient taking calcium as directed.  Denied tooth, jaw, and leg pain.  No recent or upcoming dental visits.  Labs reviewed.  Patient tolerated injection with no complaints voiced.  See MAR for details.  Patient stable during and after injection.  Site clean and dry with no bruising or swelling noted.  Band aid applied.  Vss with discharge and left in satisfactory condition with no s/s of distress noted.   

## 2022-02-07 ENCOUNTER — Other Ambulatory Visit (HOSPITAL_COMMUNITY): Payer: Self-pay

## 2022-02-12 ENCOUNTER — Other Ambulatory Visit (HOSPITAL_COMMUNITY): Payer: Self-pay

## 2022-02-13 ENCOUNTER — Other Ambulatory Visit (HOSPITAL_COMMUNITY): Payer: Self-pay

## 2022-02-14 ENCOUNTER — Other Ambulatory Visit (HOSPITAL_COMMUNITY): Payer: Self-pay

## 2022-02-19 ENCOUNTER — Other Ambulatory Visit (HOSPITAL_COMMUNITY): Payer: Self-pay

## 2022-03-05 ENCOUNTER — Inpatient Hospital Stay: Payer: Medicaid Other | Attending: Hematology

## 2022-03-05 DIAGNOSIS — G629 Polyneuropathy, unspecified: Secondary | ICD-10-CM | POA: Insufficient documentation

## 2022-03-05 DIAGNOSIS — N189 Chronic kidney disease, unspecified: Secondary | ICD-10-CM | POA: Insufficient documentation

## 2022-03-05 DIAGNOSIS — C61 Malignant neoplasm of prostate: Secondary | ICD-10-CM | POA: Diagnosis not present

## 2022-03-05 DIAGNOSIS — I10 Essential (primary) hypertension: Secondary | ICD-10-CM | POA: Diagnosis not present

## 2022-03-05 DIAGNOSIS — C7951 Secondary malignant neoplasm of bone: Secondary | ICD-10-CM | POA: Diagnosis present

## 2022-03-05 DIAGNOSIS — D631 Anemia in chronic kidney disease: Secondary | ICD-10-CM | POA: Insufficient documentation

## 2022-03-05 LAB — COMPREHENSIVE METABOLIC PANEL
ALT: 13 U/L (ref 0–44)
AST: 17 U/L (ref 15–41)
Albumin: 3.7 g/dL (ref 3.5–5.0)
Alkaline Phosphatase: 44 U/L (ref 38–126)
Anion gap: 6 (ref 5–15)
BUN: 21 mg/dL (ref 8–23)
CO2: 23 mmol/L (ref 22–32)
Calcium: 8.7 mg/dL — ABNORMAL LOW (ref 8.9–10.3)
Chloride: 110 mmol/L (ref 98–111)
Creatinine, Ser: 1.33 mg/dL — ABNORMAL HIGH (ref 0.61–1.24)
GFR, Estimated: 53 mL/min — ABNORMAL LOW (ref >60)
Glucose, Bld: 92 mg/dL (ref 70–99)
Potassium: 3.6 mmol/L (ref 3.5–5.1)
Sodium: 139 mmol/L (ref 135–145)
Total Bilirubin: 0.9 mg/dL (ref 0.3–1.2)
Total Protein: 6.7 g/dL (ref 6.5–8.1)

## 2022-03-05 LAB — MAGNESIUM: Magnesium: 2.3 mg/dL (ref 1.7–2.4)

## 2022-03-05 LAB — CBC WITH DIFFERENTIAL/PLATELET
Abs Immature Granulocytes: 0 10*3/uL (ref 0.00–0.07)
Basophils Absolute: 0 10*3/uL (ref 0.0–0.1)
Basophils Relative: 0 %
Eosinophils Absolute: 0 10*3/uL (ref 0.0–0.5)
Eosinophils Relative: 0 %
HCT: 33.6 % — ABNORMAL LOW (ref 39.0–52.0)
Hemoglobin: 11.4 g/dL — ABNORMAL LOW (ref 13.0–17.0)
Immature Granulocytes: 0 %
Lymphocytes Relative: 53 %
Lymphs Abs: 2.5 10*3/uL (ref 0.7–4.0)
MCH: 30.7 pg (ref 26.0–34.0)
MCHC: 33.9 g/dL (ref 30.0–36.0)
MCV: 90.6 fL (ref 80.0–100.0)
Monocytes Absolute: 0.4 10*3/uL (ref 0.1–1.0)
Monocytes Relative: 9 %
Neutro Abs: 1.8 10*3/uL (ref 1.7–7.7)
Neutrophils Relative %: 38 %
Platelets: 203 10*3/uL (ref 150–400)
RBC: 3.71 MIL/uL — ABNORMAL LOW (ref 4.22–5.81)
RDW: 13.5 % (ref 11.5–15.5)
WBC: 4.8 10*3/uL (ref 4.0–10.5)
nRBC: 0 % (ref 0.0–0.2)

## 2022-03-05 LAB — PSA: Prostatic Specific Antigen: 0.26 ng/mL (ref 0.00–4.00)

## 2022-03-06 ENCOUNTER — Inpatient Hospital Stay: Payer: Medicaid Other | Admitting: Hematology

## 2022-03-06 ENCOUNTER — Inpatient Hospital Stay: Payer: Medicaid Other

## 2022-03-06 VITALS — BP 158/83 | HR 60 | Temp 97.6°F | Resp 18 | Ht 69.29 in | Wt 172.2 lb

## 2022-03-06 DIAGNOSIS — C61 Malignant neoplasm of prostate: Secondary | ICD-10-CM

## 2022-03-06 MED ORDER — DENOSUMAB 120 MG/1.7ML ~~LOC~~ SOLN
120.0000 mg | Freq: Once | SUBCUTANEOUS | Status: AC
  Administered 2022-03-06: 120 mg via SUBCUTANEOUS
  Filled 2022-03-06: qty 1.7

## 2022-03-06 NOTE — Progress Notes (Signed)
[place] [place], [place] [address]   CLINIC:  Medical Oncology/Hematology  PCP:  Raiford Simmonds., PA-C [place] / [place] [address] [phone]   REASON FOR VISIT:  Follow-up for metastatic prostate cancer  PRIOR THERAPY:  1. Bilateral orchiectomy on 09/17/2017. 2. Docetaxel x 6 cycles from 11/19/2017 to 03/05/2018  NGS Results: not done  CURRENT THERAPY: Zytiga 1,000 mg daily; Xgeva monthly  BRIEF ONCOLOGIC HISTORY:  Oncology History  Malignant neoplasm of prostate ([facility])  09/27/2017 Initial Diagnosis   Malignant neoplasm of prostate ([facility])   11/19/2017 - 03/07/2018 Chemotherapy   The patient had pegfilgrastim (NEULASTA) injection 6 mg, 6 mg, Subcutaneous, Once, 6 of 6 cycles Administration: 6 mg (11/21/2017), 6 mg (12/12/2017), 6 mg (01/02/2018), 6 mg (01/24/2018), 6 mg (02/14/2018), 6 mg (03/07/2018) pegfilgrastim (NEULASTA ONPRO KIT) injection 6 mg, 6 mg, Subcutaneous, Once, 2 of 2 cycles DOCEtaxel (TAXOTERE) 140 mg in sodium chloride 0.9 % 250 mL chemo infusion, 75 mg/m2 = 140 mg, Intravenous,  Once, 6 of 6 cycles Administration: 140 mg (11/19/2017), 140 mg (12/10/2017), 140 mg (01/22/2018), 140 mg (12/31/2017), 140 mg (02/12/2018), 140 mg (03/05/2018) ondansetron (ZOFRAN) 8 mg, dexamethasone (DECADRON) 10 mg in sodium chloride 0.9 % 50 mL IVPB, , Intravenous,  Once, 4 of 4 cycles Administration:  (12/31/2017),  (01/22/2018),  (02/12/2018),  (03/05/2018)  for chemotherapy treatment.      CANCER STAGING:  Cancer Staging  No matching staging information was found for the patient.  INTERVAL HISTORY:  [name], a 81 y.o. male, seen for follow-up of metastatic colon cancer.  He is tolerating Abiraterone and prednisone very well.  Chronic shoulder pain is stable.  He has vertigo which is also stable.  Tingling in the feet is unchanged.  Energy levels are reported as 75%.  REVIEW OF SYSTEMS:  Review of Systems   Constitutional:  Negative for appetite change and fatigue.  Gastrointestinal:  Negative for constipation, diarrhea, nausea and vomiting.  Musculoskeletal:  Negative for arthralgias.  Neurological:  Positive for dizziness. Negative for numbness.  All other systems reviewed and are negative.   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 ([facility]) 09/27/2017   Sickle cell trait ([facility] [place] [facility])    Vertigo    Past Surgical History:  Procedure Laterality Date   CYSTOSCOPY WITH FULGERATION  09/17/2017   Procedure: CYSTOSCOPY WITH FULGERATION OF BLADDER NECK;  Surgeon: [name], MD;  Location: [facility];  Service: Urology;;   CYSTOSCOPY WITH STENT PLACEMENT Right 09/17/2017   Procedure: CYSTOSCOPY WITH RIGHT RETROGRADE PYELOGRAM ATTEMPTED STENT PLACEMENT;  Surgeon: [name], MD;  Location: [facility];  Service: Urology;  Laterality: Right;   left leg surgery due to [place] Bilateral 09/17/2017   Procedure: ORCHIECTOMY;  Surgeon: [name], MD;  Location: [facility];  Service: Urology;  Laterality: Bilateral;   PORTACATH PLACEMENT Left 10/14/2017   Procedure: INSERTION PORT-A-CATH;  Surgeon: Aviva Signs, MD;  Location: [facility];  Service: General;  Laterality: Left;   PROSTATE BIOPSY N/A 09/17/2017   Procedure: BIOPSY TRANSRECTAL ULTRASONIC PROSTATE (TUBP);  Surgeon: [name], MD;  Location: [facility];  Service: Urology;  Laterality: N/A;    SOCIAL HISTORY:  Social History   Socioeconomic History   Marital status: Single    Spouse name: Not on file   Number of  children: Not on file   Years of education: Not on file   Highest education level: Not on file  Occupational History   Not on file  Tobacco Use   Smoking status: Never   Smokeless tobacco: Never  Vaping Use   Vaping Use: Never used  Substance and Sexual Activity   Alcohol use: Yes    Comment: Drinks beer occasionally    Drug  use: No   Sexual activity: Not Currently  Other Topics Concern   Not on file  Social History Narrative   Not on file   Social Determinants of Health   Financial Resource Strain: Low Risk  (03/31/2020)   Overall Financial Resource Strain (CARDIA)    Difficulty of Paying Living Expenses: Not hard at all  Food Insecurity: No Food Insecurity (03/31/2020)   Hunger Vital Sign    Worried About Running Out of Food in the Last Year: Never true    Ran Out of Food in the Last Year: Never true  Transportation Needs: No Transportation Needs (03/31/2020)   PRAPARE - Hydrologist (Medical): No    Lack of Transportation (Non-Medical): No  Physical Activity: Inactive (03/31/2020)   Exercise Vital Sign    Days of Exercise per Week: 0 days    Minutes of Exercise per Session: 0 min  Stress: No Stress Concern Present (03/31/2020)   [place]    Feeling of Stress : Not at all  Social Connections: Moderately Isolated (03/31/2020)   Social Connection and Isolation Panel [NHANES]    Frequency of Communication with Friends and Family: More than three times a week    Frequency of Social Gatherings with Friends and Family: More than three times a week    Attends Religious Services: More than 4 times per year    Active Member of [employer] or Organizations: No    Attends Archivist Meetings: Never    Marital Status: Divorced  Human resources officer Violence: Not At Risk (03/31/2020)   Humiliation, Afraid, Rape, and Kick questionnaire    Fear of Current or Ex-Partner: No    Emotionally Abused: No    Physically Abused: No    Sexually Abused: No    FAMILY HISTORY:  Family History  Problem Relation Age of Onset   Hypertension Mother    Lung cancer Father    Lung cancer Brother    HIV/AIDS Brother    Breast cancer Daughter     CURRENT MEDICATIONS:  Current Outpatient Medications  Medication Sig Dispense Refill    abiraterone acetate (ZYTIGA) 250 MG tablet TAKE 4 TABLETS (1,000 MG TOTAL) BY MOUTH DAILY. TAKE ON AN EMPTY STOMACH 1 HOUR BEFORE OR 2 HOURS AFTER A MEAL 120 tablet 3   docusate sodium (COLACE) 100 MG capsule Take 100 mg by mouth daily.     dronabinol (MARINOL) 5 MG capsule Take 1 capsule (5 mg total) by mouth 2 (two) times daily before a meal. 60 capsule 1   gabapentin (NEURONTIN) 300 MG capsule TAKE 1 CAPSULE BY MOUTH EVERYDAY AT BEDTIME 30 capsule 1   Misc. Devices MISC Please provide patient with a rollaider that has a seat. 1 each 0   predniSONE (DELTASONE) 5 MG tablet TAKE 1 TABLET BY MOUTH ONCE A DAY WITH BREAKFAST 30 tablet 6   No current facility-administered medications for this visit.   Facility-Administered Medications Ordered in Other Visits  Medication Dose Route Frequency Provider Last Rate Last Admin  lanreotide acetate (SOMATULINE DEPOT) 120 MG/0.5ML injection             ALLERGIES:  No Known Allergies  PHYSICAL EXAM:  Performance status (ECOG): 1 - Symptomatic but completely ambulatory  Vitals:   03/06/22 1110  BP: (!) 158/83  Pulse: 60  Resp: 18  Temp: 97.6 F (36.4 C)  SpO2: 95%   Wt Readings from Last 3 Encounters:  03/06/22 172 lb 3.2 oz (78.1 kg)  12/12/21 172 lb 3.2 oz (78.1 kg)  09/19/21 176 lb 14.4 oz (80.2 kg)   Physical Exam Vitals reviewed.  Constitutional:      Appearance: Normal appearance.  Cardiovascular:     Rate and Rhythm: Normal rate and regular rhythm.     Pulses: Normal pulses.     Heart sounds: Normal heart sounds.  Pulmonary:     Effort: Pulmonary effort is normal.     Breath sounds: Normal breath sounds.  Neurological:     General: No focal deficit present.     Mental Status: He is alert and oriented to person, place, and time.  Psychiatric:        Mood and Affect: Mood normal.        Behavior: Behavior normal.      LABORATORY DATA:  I have reviewed the labs as listed.     Latest Ref Rng & Units 03/05/2022    8:07  AM 12/12/2021    9:49 AM 09/14/2021   10:53 AM  CBC  WBC 4.0 - 10.5 K/uL 4.8  5.0  4.4   Hemoglobin 13.0 - 17.0 g/dL 11.4  11.5  11.3   Hematocrit 39.0 - 52.0 % 33.6  35.1  33.3   Platelets 150 - 400 K/uL 203  145  ACLMP       Latest Ref Rng & Units 03/05/2022    8:07 AM 02/06/2022   10:55 AM 01/09/2022   10:39 AM  CMP  Glucose 70 - 99 mg/dL 92  96  93   BUN 8 - 23 mg/dL _0 Creatinine 0.61 - 1.24 mg/dL 1.33  1.53  1.39   Sodium 135 - 145 mmol/L 139  139  139   Potassium 3.5 - 5.1 mmol/L 3.6  3.7  3.6   Chloride 98 - 111 mmol/L 110  107  109   CO2 22 - 32 mmol/L _1 Calcium 8.9 - 10.3 mg/dL 8.7  9.0  9.1   Total Protein 6.5 - 8.1 g/dL 6.7  6.9  6.7   Total Bilirubin 0.3 - 1.2 mg/dL 0.9  1.2  1.2   Alkaline Phos 38 - 126 U/L 44  46  49   AST 15 - 41 U/L _2 ALT 0 - 44 U/L _3 DIAGNOSTIC IMAGING:  I have independently reviewed the scans and discussed with the patient. No results found.   ASSESSMENT:  1.  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 -6 cycles of docetaxel from 11/19/2017 through 03/05/2018. -Abiraterone and prednisone started on 05/07/2018.  PSA was 4.52.   2.  Bone metastasis: -Denosumab started on 07/21/2018.      PLAN:  1.  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 - Labs show normal LFTs and potassium.  Blood pressure is minimally elevated. -  PSA 0.26, slightly increased from 0.22 on 12/12/2021. - Continue Abiraterone 1000 mg daily and prednisone 5 mg daily. - RTC 3 months for follow-up with repeat PSA.   2.  Bone metastasis: - Calcium is 8.7.  Continue calcium supplements.  Continue denosumab monthly.   3.  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 - Hemoglobin today stable at 11.4.  If any worsening, will check ferritin  and iron panel.   4.  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  Continue gabapentin.   5.  CKD: - Baseline had been between 1.3-1.6.  This is stable.  6.  Hypertension: - Continue Norvasc 5 mg daily.  Blood pressure is 158/83.   Orders placed this encounter:  No orders of the defined types were placed in this encounter.    Derek Jack, MD [place] [phone]

## 2022-03-06 NOTE — Patient Instructions (Signed)
[facility]  Discharge Instructions: Thank you for choosing [facility] to provide your oncology and hematology care.  If you have a lab appointment with the [facility], please come in thru the Main Entrance and check in at the main information desk.  Wear comfortable clothing and clothing appropriate for easy access to any Portacath or PICC line.   We strive to give you quality time with your provider. You may need to reschedule your appointment if you arrive late (15 or more minutes).  Arriving late affects you and other patients whose appointments are after yours.  Also, if you miss three or more appointments without notifying the office, you may be dismissed from the clinic at the provider's discretion.      For prescription refill requests, have your pharmacy contact our office and allow 72 hours for refills to be completed.    Today you received the following chemotherapy and/or immunotherapy agents Xgeva      To help prevent nausea and vomiting after your treatment, we encourage you to take your nausea medication as directed.  BELOW ARE SYMPTOMS THAT SHOULD BE REPORTED IMMEDIATELY: *FEVER GREATER THAN 100.4 F (38 C) OR HIGHER *CHILLS OR SWEATING *NAUSEA AND VOMITING THAT IS NOT CONTROLLED WITH YOUR NAUSEA MEDICATION *UNUSUAL SHORTNESS OF BREATH *UNUSUAL BRUISING OR BLEEDING *URINARY PROBLEMS (pain or burning when urinating, or frequent urination) *BOWEL PROBLEMS (unusual diarrhea, constipation, pain near the anus) TENDERNESS IN MOUTH AND THROAT WITH OR WITHOUT PRESENCE OF ULCERS (sore throat, sores in mouth, or a toothache) UNUSUAL RASH, SWELLING OR PAIN  UNUSUAL VAGINAL DISCHARGE OR ITCHING   Items with * indicate a potential emergency and should be followed up as soon as possible or go to the Emergency Department if any problems should occur.  Please show the CHEMOTHERAPY ALERT CARD or IMMUNOTHERAPY ALERT CARD at check-in to the Emergency  Department and triage nurse.  Should you have questions after your visit or need to cancel or reschedule your appointment, please contact [facility] [phone]  and follow the prompts.  Office hours are 8:00 a.m. to 4:30 p.m. Monday - Friday. Please note that voicemails left after 4:00 p.m. may not be returned until the following business day.  We are closed weekends and major holidays. You have access to a nurse at all times for urgent questions. Please call the main number to the clinic [phone] and follow the prompts.  For any non-urgent questions, you may also contact your provider using MyChart. We now offer e-Visits for anyone 69 and older to request care online for non-urgent symptoms. For details visit [email or web address].   Also download the MyChart app! Go to the app store, search "MyChart", open the app, select [facility], and log in with your MyChart username and password.  Masks are optional in the cancer centers. If you would like for your care team to wear a mask while they are taking care of you, please let them know. You may have one support person who is at least 82 years old accompany you for your appointments.

## 2022-03-06 NOTE — Progress Notes (Signed)
Patient is taking Zytiga as prescribed.  He has not missed any doses and reports no side effects at this time.   

## 2022-03-06 NOTE — Progress Notes (Signed)
Carlos Harper presents today for Xgeva injection per the provider's orders.  Message received from Anastasio Champion RN/Dr. Delton Coombes patient okay for injection.  Stable during administration without incident; injection site WNL; see MAR for injection details.  Patient tolerated procedure well and without incident.  No questions or complaints noted at this time.

## 2022-03-08 ENCOUNTER — Other Ambulatory Visit: Payer: Self-pay | Admitting: Hematology

## 2022-03-12 ENCOUNTER — Other Ambulatory Visit (HOSPITAL_COMMUNITY): Payer: Self-pay

## 2022-03-14 ENCOUNTER — Other Ambulatory Visit (HOSPITAL_COMMUNITY): Payer: Self-pay

## 2022-03-16 ENCOUNTER — Other Ambulatory Visit (HOSPITAL_COMMUNITY): Payer: Self-pay

## 2022-03-19 ENCOUNTER — Other Ambulatory Visit (HOSPITAL_COMMUNITY): Payer: Self-pay

## 2022-04-03 ENCOUNTER — Inpatient Hospital Stay: Payer: Medicaid Other

## 2022-04-03 ENCOUNTER — Inpatient Hospital Stay: Payer: Medicaid Other | Attending: Hematology

## 2022-04-03 VITALS — BP 135/75 | HR 75 | Temp 96.2°F | Resp 18

## 2022-04-03 DIAGNOSIS — C61 Malignant neoplasm of prostate: Secondary | ICD-10-CM | POA: Diagnosis not present

## 2022-04-03 DIAGNOSIS — C7951 Secondary malignant neoplasm of bone: Secondary | ICD-10-CM | POA: Insufficient documentation

## 2022-04-03 LAB — COMPREHENSIVE METABOLIC PANEL
ALT: 11 U/L (ref 0–44)
AST: 17 U/L (ref 15–41)
Albumin: 3.6 g/dL (ref 3.5–5.0)
Alkaline Phosphatase: 55 U/L (ref 38–126)
Anion gap: 9 (ref 5–15)
BUN: 18 mg/dL (ref 8–23)
CO2: 26 mmol/L (ref 22–32)
Calcium: 9.6 mg/dL (ref 8.9–10.3)
Chloride: 107 mmol/L (ref 98–111)
Creatinine, Ser: 1.42 mg/dL — ABNORMAL HIGH (ref 0.61–1.24)
GFR, Estimated: 49 mL/min — ABNORMAL LOW (ref >60)
Glucose, Bld: 92 mg/dL (ref 70–99)
Potassium: 3.9 mmol/L (ref 3.5–5.1)
Sodium: 142 mmol/L (ref 135–145)
Total Bilirubin: 0.8 mg/dL (ref 0.3–1.2)
Total Protein: 7.2 g/dL (ref 6.5–8.1)

## 2022-04-03 MED ORDER — DENOSUMAB 120 MG/1.7ML ~~LOC~~ SOLN
120.0000 mg | Freq: Once | SUBCUTANEOUS | Status: AC
  Administered 2022-04-03: 120 mg via SUBCUTANEOUS
  Filled 2022-04-03: qty 1.7

## 2022-04-03 NOTE — Progress Notes (Signed)
Patient presents today for Xgeva injection per providers order.   Stable during administration without incident; injection site WNL; see MAR for injection details.  Patient tolerated procedure well and without incident.  No questions or complaints noted at this time.

## 2022-04-03 NOTE — Patient Instructions (Signed)
[facility]  Discharge Instructions: Thank you for choosing [facility] to provide your oncology and hematology care.  If you have a lab appointment with the [facility], please come in thru the Main Entrance and check in at the main information desk.  Wear comfortable clothing and clothing appropriate for easy access to any Portacath or PICC line.   We strive to give you quality time with your provider. You may need to reschedule your appointment if you arrive late (15 or more minutes).  Arriving late affects you and other patients whose appointments are after yours.  Also, if you miss three or more appointments without notifying the office, you may be dismissed from the clinic at the provider's discretion.      For prescription refill requests, have your pharmacy contact our office and allow 72 hours for refills to be completed.    Today you received the following chemotherapy and/or immunotherapy agents Xgeva      To help prevent nausea and vomiting after your treatment, we encourage you to take your nausea medication as directed.  BELOW ARE SYMPTOMS THAT SHOULD BE REPORTED IMMEDIATELY: *FEVER GREATER THAN 100.4 F (38 C) OR HIGHER *CHILLS OR SWEATING *NAUSEA AND VOMITING THAT IS NOT CONTROLLED WITH YOUR NAUSEA MEDICATION *UNUSUAL SHORTNESS OF BREATH *UNUSUAL BRUISING OR BLEEDING *URINARY PROBLEMS (pain or burning when urinating, or frequent urination) *BOWEL PROBLEMS (unusual diarrhea, constipation, pain near the anus) TENDERNESS IN MOUTH AND THROAT WITH OR WITHOUT PRESENCE OF ULCERS (sore throat, sores in mouth, or a toothache) UNUSUAL RASH, SWELLING OR PAIN  UNUSUAL VAGINAL DISCHARGE OR ITCHING   Items with * indicate a potential emergency and should be followed up as soon as possible or go to the Emergency Department if any problems should occur.  Please show the CHEMOTHERAPY ALERT CARD or IMMUNOTHERAPY ALERT CARD at check-in to the Emergency  Department and triage nurse.  Should you have questions after your visit or need to cancel or reschedule your appointment, please contact [facility] [phone]  and follow the prompts.  Office hours are 8:00 a.m. to 4:30 p.m. Monday - Friday. Please note that voicemails left after 4:00 p.m. may not be returned until the following business day.  We are closed weekends and major holidays. You have access to a nurse at all times for urgent questions. Please call the main number to the clinic [phone] and follow the prompts.  For any non-urgent questions, you may also contact your provider using MyChart. We now offer e-Visits for anyone 29 and older to request care online for non-urgent symptoms. For details visit [email or web address].   Also download the MyChart app! Go to the app store, search "MyChart", open the app, select [facility], and log in with your MyChart username and password.  Masks are optional in the cancer centers. If you would like for your care team to wear a mask while they are taking care of you, please let them know. You may have one support person who is at least 82 years old accompany you for your appointments.

## 2022-04-06 ENCOUNTER — Other Ambulatory Visit (HOSPITAL_COMMUNITY): Payer: Self-pay

## 2022-04-06 ENCOUNTER — Other Ambulatory Visit: Payer: Self-pay

## 2022-04-09 ENCOUNTER — Other Ambulatory Visit (HOSPITAL_COMMUNITY): Payer: Self-pay

## 2022-04-09 ENCOUNTER — Other Ambulatory Visit (HOSPITAL_COMMUNITY): Payer: Self-pay | Admitting: Hematology

## 2022-04-09 DIAGNOSIS — C61 Malignant neoplasm of prostate: Secondary | ICD-10-CM

## 2022-04-10 ENCOUNTER — Encounter (HOSPITAL_COMMUNITY): Payer: Self-pay | Admitting: Hematology

## 2022-04-10 ENCOUNTER — Other Ambulatory Visit: Payer: Self-pay | Admitting: *Deleted

## 2022-04-10 MED ORDER — AMLODIPINE BESYLATE 5 MG PO TABS: 5.0000 mg | ORAL_TABLET | Freq: Every day | ORAL | 0 refills | Status: DC

## 2022-04-11 ENCOUNTER — Other Ambulatory Visit: Payer: Self-pay

## 2022-04-24 ENCOUNTER — Encounter (HOSPITAL_COMMUNITY): Payer: Self-pay | Admitting: Hematology

## 2022-05-01 ENCOUNTER — Other Ambulatory Visit (HOSPITAL_COMMUNITY): Payer: Self-pay

## 2022-05-01 ENCOUNTER — Encounter: Payer: Self-pay | Admitting: Family Medicine

## 2022-05-01 ENCOUNTER — Inpatient Hospital Stay: Payer: Medicaid Other | Attending: Hematology

## 2022-05-01 ENCOUNTER — Encounter (HOSPITAL_COMMUNITY): Payer: Self-pay | Admitting: Hematology

## 2022-05-01 ENCOUNTER — Inpatient Hospital Stay: Payer: Medicaid Other

## 2022-05-01 ENCOUNTER — Ambulatory Visit (INDEPENDENT_AMBULATORY_CARE_PROVIDER_SITE_OTHER): Payer: Medicaid Other | Admitting: Family Medicine

## 2022-05-01 VITALS — BP 137/90 | HR 71 | Temp 98.3°F | Resp 16

## 2022-05-01 VITALS — BP 140/60 | HR 93 | Temp 97.5°F | Ht 69.0 in | Wt 175.0 lb

## 2022-05-01 DIAGNOSIS — C61 Malignant neoplasm of prostate: Secondary | ICD-10-CM

## 2022-05-01 DIAGNOSIS — Z7189 Other specified counseling: Secondary | ICD-10-CM | POA: Diagnosis not present

## 2022-05-01 DIAGNOSIS — Z0100 Encounter for examination of eyes and vision without abnormal findings: Secondary | ICD-10-CM

## 2022-05-01 DIAGNOSIS — N1831 Chronic kidney disease, stage 3a: Secondary | ICD-10-CM | POA: Diagnosis not present

## 2022-05-01 DIAGNOSIS — Z0001 Encounter for general adult medical examination with abnormal findings: Secondary | ICD-10-CM | POA: Diagnosis not present

## 2022-05-01 DIAGNOSIS — C7951 Secondary malignant neoplasm of bone: Secondary | ICD-10-CM | POA: Insufficient documentation

## 2022-05-01 DIAGNOSIS — I10 Essential (primary) hypertension: Secondary | ICD-10-CM | POA: Diagnosis not present

## 2022-05-01 DIAGNOSIS — Z0000 Encounter for general adult medical examination without abnormal findings: Secondary | ICD-10-CM | POA: Insufficient documentation

## 2022-05-01 LAB — COMPREHENSIVE METABOLIC PANEL
ALT: 12 U/L (ref 0–44)
AST: 19 U/L (ref 15–41)
Albumin: 4 g/dL (ref 3.5–5.0)
Alkaline Phosphatase: 47 U/L (ref 38–126)
Anion gap: 9 (ref 5–15)
BUN: 22 mg/dL (ref 8–23)
CO2: 25 mmol/L (ref 22–32)
Calcium: 9.2 mg/dL (ref 8.9–10.3)
Chloride: 104 mmol/L (ref 98–111)
Creatinine, Ser: 1.37 mg/dL — ABNORMAL HIGH (ref 0.61–1.24)
GFR, Estimated: 52 mL/min — ABNORMAL LOW (ref >60)
Glucose, Bld: 95 mg/dL (ref 70–99)
Potassium: 3.8 mmol/L (ref 3.5–5.1)
Sodium: 138 mmol/L (ref 135–145)
Total Bilirubin: 1.3 mg/dL — ABNORMAL HIGH (ref 0.3–1.2)
Total Protein: 7 g/dL (ref 6.5–8.1)

## 2022-05-01 MED ORDER — DENOSUMAB 120 MG/1.7ML ~~LOC~~ SOLN
120.0000 mg | Freq: Once | SUBCUTANEOUS | Status: AC
  Administered 2022-05-01: 120 mg via SUBCUTANEOUS
  Filled 2022-05-01: qty 1.7

## 2022-05-01 NOTE — Patient Instructions (Signed)
Reports blood pressure reading over next 3 days at home via MyChart. Return to office for fasting labs.  It was great to meet you today and I'm excited to have you join the [facility] practice. I hope you had a positive experience today! If you feel so inclined, please feel free to recommend our practice to friends and family. Mila Merry, FNP-C

## 2022-05-01 NOTE — Assessment & Plan Note (Signed)
Chronic, stable. Creatinine 1.33-1.56. Previously seeing nephrologist but not currently being followed, no records available, will place referral.

## 2022-05-01 NOTE — Progress Notes (Signed)
Patient taking calcium as directed. Denied tooth, jaw, and leg pain. No recent or upcoming dental visits. Labs reviewed. Patient tolerated injection with no complaints voiced. See MAR for details. Patient stable during and after injection. Site clean and dry with no bruising or swelling noted. Band aid applied. Vss with discharge and left in satisfactory condition with no s/s of distress.  

## 2022-05-01 NOTE — Assessment & Plan Note (Signed)

## 2022-05-01 NOTE — Assessment & Plan Note (Signed)
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 Denosumab started on 07/21/2018 for bone mets. Followed by Oncology every 3 months, last PSA 0.26.

## 2022-05-01 NOTE — Assessment & Plan Note (Signed)
Chronic. Elevated in office today to 140/60. Will verify with home readings over next 3 days and may add Losartan '50mg'$  daily. He denies chest pain, shortness of breath, headaches, palpitations, lightheadedness, dizziness, encouraged to seek immediate medical care for any of these symptoms.

## 2022-05-01 NOTE — Patient Instructions (Signed)
[facility]  Discharge Instructions: Thank you for choosing [facility] to provide your oncology and hematology care.  If you have a lab appointment with the Cancer Center, please come in thru the Main Entrance and check in at the main information desk.  Wear comfortable clothing and clothing appropriate for easy access to any Portacath or PICC line.   We strive to give you quality time with your provider. You may need to reschedule your appointment if you arrive late (15 or more minutes).  Arriving late affects you and other patients whose appointments are after yours.  Also, if you miss three or more appointments without notifying the office, you may be dismissed from the clinic at the provider's discretion.      For prescription refill requests, have your pharmacy contact our office and allow 72 hours for refills to be completed.    Today you received the following Xgeva, return as scheduled.   To help prevent nausea and vomiting after your treatment, we encourage you to take your nausea medication as directed.  BELOW ARE SYMPTOMS THAT SHOULD BE REPORTED IMMEDIATELY: *FEVER GREATER THAN 100.4 F (38 C) OR HIGHER *CHILLS OR SWEATING *NAUSEA AND VOMITING THAT IS NOT CONTROLLED WITH YOUR NAUSEA MEDICATION *UNUSUAL SHORTNESS OF BREATH *UNUSUAL BRUISING OR BLEEDING *URINARY PROBLEMS (pain or burning when urinating, or frequent urination) *BOWEL PROBLEMS (unusual diarrhea, constipation, pain near the anus) TENDERNESS IN MOUTH AND THROAT WITH OR WITHOUT PRESENCE OF ULCERS (sore throat, sores in mouth, or a toothache) UNUSUAL RASH, SWELLING OR PAIN  UNUSUAL VAGINAL DISCHARGE OR ITCHING   Items with * indicate a potential emergency and should be followed up as soon as possible or go to the Emergency Department if any problems should occur.  Please show the CHEMOTHERAPY ALERT CARD or IMMUNOTHERAPY ALERT CARD at check-in to the Emergency Department and triage  nurse.  Should you have questions after your visit or need to cancel or reschedule your appointment, please contact [facility] [phone]  and follow the prompts.  Office hours are 8:00 a.m. to 4:30 p.m. Monday - Friday. Please note that voicemails left after 4:00 p.m. may not be returned until the following business day.  We are closed weekends and major holidays. You have access to a nurse at all times for urgent questions. Please call the main number to the clinic [phone] and follow the prompts.  For any non-urgent questions, you may also contact your provider using MyChart. We now offer e-Visits for anyone 18 and older to request care online for non-urgent symptoms. For details visit [email or web address].   Also download the MyChart app! Go to the app store, search "MyChart", open the app, select , and log in with your MyChart username and password.   

## 2022-05-01 NOTE — Progress Notes (Signed)
New Patient Office Visit  Subjective    Patient ID: Carlos Harper, male    DOB: December 24, 1940  Age: 82 y.o. MRN: [id number]  CC:  Chief Complaint  Patient presents with   Establish Care    HPI Carlos Harper presents to establish care. Oriented to practice routines and expectations. He has not had a PCP in some time, is followed closely by Oncology. Concerns today include dry cough.  COUGH Duration: months Circumstances of initial development of cough: unknown Cough severity: mild Cough description: dry, tickling in throat Aggravating factors: none Alleviating factors: none Status: ongoing Treatments attempted: none Wheezing: no Shortness of breath: no Chest pain: no Chest tightness:no Nasal congestion: no Runny nose: no Postnasal drip: no Frequent throat clearing or swallowing: no Hemoptysis: no Fevers: no Night sweats: no Weight loss: no Heartburn: no Recent foreign travel: no Tuberculosis contacts: no   Outpatient Encounter Medications as of 05/01/2022  Medication Sig   abiraterone acetate (ZYTIGA) 250 MG tablet TAKE 4 TABLETS (1,000 MG TOTAL) BY MOUTH DAILY. TAKE ON AN EMPTY STOMACH 1 HOUR BEFORE OR 2 HOURS AFTER A MEAL   amLODipine (NORVASC) 5 MG tablet Take 1 tablet (5 mg total) by mouth daily.   docusate sodium (COLACE) 100 MG capsule Take 100 mg by mouth daily.   gabapentin (NEURONTIN) 300 MG capsule TAKE 1 CAPSULE BY MOUTH EVERYDAY AT BEDTIME   Misc. Devices MISC Please provide patient with a rollaider that has a seat.   predniSONE (DELTASONE) 5 MG tablet TAKE 1 TABLET BY MOUTH ONCE A DAY WITH BREAKFAST   dronabinol (MARINOL) 5 MG capsule Take 1 capsule (5 mg total) by mouth 2 (two) times daily before a meal. 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   Sickle cell trait ([place])    Vertigo     Past Surgical History:  Procedure Laterality Date   CYSTOSCOPY WITH FULGERATION  09/17/2017   Procedure: CYSTOSCOPY WITH FULGERATION OF BLADDER NECK;  Surgeon: [name], MD;  Location: [facility];  Service: Urology;;   CYSTOSCOPY WITH STENT PLACEMENT Right 09/17/2017   Procedure: CYSTOSCOPY WITH RIGHT RETROGRADE PYELOGRAM ATTEMPTED STENT PLACEMENT;  Surgeon: [name], MD;  Location: [facility];  Service: Urology;  Laterality: Right;   left leg surgery due to [place] Bilateral 09/17/2017   Procedure: ORCHIECTOMY;  Surgeon: [name], MD;  Location: [facility];  Service: Urology;  Laterality: Bilateral;   PORTACATH PLACEMENT Left 10/14/2017   Procedure: INSERTION PORT-A-CATH;  Surgeon: Aviva Signs, MD;  Location: [facility];  Service: General;  Laterality: Left;   PROSTATE BIOPSY N/A 09/17/2017   Procedure: BIOPSY TRANSRECTAL ULTRASONIC PROSTATE (TUBP);  Surgeon: [name], MD;  Location: [facility];  Service: Urology;  Laterality: N/A;    Family History  Problem Relation Age of Onset   Hypertension Mother    Lung cancer Father    Lung cancer Brother    HIV/AIDS Brother    Breast cancer Daughter     Social History   Socioeconomic History   Marital status: Single    Spouse name: Not on file   Number of children: Not on file   Years of education: Not on file   Highest education level:  Not on file  Occupational History   Not on file  Tobacco Use   Smoking status: Never   Smokeless tobacco: Never  Vaping Use   Vaping Use: Never used  Substance and Sexual Activity   Alcohol use: Yes    Comment: Drinks beer occasionally    Drug use: No   Sexual activity: Not Currently  Other Topics Concern   Not on file  Social History Narrative   Not on file   Social Determinants of Health   Financial Resource Strain: Low Risk  (03/31/2020)   Overall Financial Resource  Strain (CARDIA)    Difficulty of Paying Living Expenses: Not hard at all  Food Insecurity: No Food Insecurity (03/31/2020)   Hunger Vital Sign    Worried About Running Out of Food in the Last Year: Never true    Ran Out of Food in the Last Year: Never true  Transportation Needs: No Transportation Needs (03/31/2020)   PRAPARE - Hydrologist (Medical): No    Lack of Transportation (Non-Medical): No  Physical Activity: Inactive (03/31/2020)   Exercise Vital Sign    Days of Exercise per Week: 0 days    Minutes of Exercise per Session: 0 min  Stress: No Stress Concern Present (03/31/2020)   [place]    Feeling of Stress : Not at all  Social Connections: Moderately Isolated (03/31/2020)   Social Connection and Isolation Panel [NHANES]    Frequency of Communication with Friends and Family: More than three times a week    Frequency of Social Gatherings with Friends and Family: More than three times a week    Attends Religious Services: More than 4 times per year    Active Member of [employer] or Organizations: No    Attends Archivist Meetings: Never    Marital Status: Divorced  Human resources officer Violence: Not At Risk (03/31/2020)   Humiliation, Afraid, Rape, and Kick questionnaire    Fear of Current or Ex-Partner: No    Emotionally Abused: No    Physically Abused: No    Sexually Abused: No    Review of Systems  Constitutional: Negative.   HENT:  Positive for hearing loss.   Eyes:  Positive for blurred vision and double vision.  Respiratory:  Positive for cough.   Cardiovascular: Negative.   Gastrointestinal: Negative.   Genitourinary: Negative.   Musculoskeletal: Negative.   Skin: Negative.   Neurological:  Positive for dizziness (vertigo) and tingling (BLE baseline).  Endo/Heme/Allergies: Negative.   Psychiatric/Behavioral: Negative.          Objective    BP (!) 140/60   Pulse  93   Temp (!) 97.5 F (36.4 C) (Oral)   Ht '5\' 9"'$  (1.753 m)   Wt 175 lb (79.4 kg)   SpO2 99%   BMI 25.84 kg/m     05/01/2022   12:47 PM 05/01/2022   12:43 PM 05/01/2022   10:37 AM  Vitals with BMI  Height   '5\' 9"'$   Weight   175 lbs  BMI   21.19  Systolic 417 408 144  Diastolic 60 90 70  Pulse  71 93    Physical Exam Vitals and nursing note reviewed.  Constitutional:      Appearance: Normal appearance. He is normal weight.  HENT:     Head: Normocephalic and atraumatic.     Right Ear: Tympanic membrane, ear canal and external ear normal.  Left Ear: Tympanic membrane, ear canal and external ear normal.     Nose: Nose normal.     Mouth/Throat:     Mouth: Mucous membranes are moist.     Pharynx: Oropharynx is clear.  Eyes:     Extraocular Movements: Extraocular movements intact.     Right eye: Normal extraocular motion and no nystagmus.     Left eye: Normal extraocular motion and no nystagmus.     Conjunctiva/sclera: Conjunctivae normal.     Pupils: Pupils are equal, round, and reactive to light.  Cardiovascular:     Rate and Rhythm: Normal rate and regular rhythm.     Pulses: Normal pulses.     Heart sounds: Normal heart sounds.  Pulmonary:     Effort: Pulmonary effort is normal.     Breath sounds: Normal breath sounds.  Abdominal:     General: Bowel sounds are normal.     Palpations: Abdomen is soft.  Genitourinary:    Comments: Deferred using shared decision making Musculoskeletal:        General: Normal range of motion.     Cervical back: Normal range of motion and neck supple.  Skin:    General: Skin is warm and dry.     Capillary Refill: Capillary refill takes less than 2 seconds.  Neurological:     General: No focal deficit present.     Mental Status: He is alert. Mental status is at baseline.  Psychiatric:        Mood and Affect: Mood normal.        Speech: Speech normal.        Behavior: Behavior normal.        Thought Content: Thought content normal.         Cognition and Memory: Cognition and memory normal.        Judgment: Judgment normal.         Assessment & Plan:   Problem List Items Addressed This Visit       Cardiovascular and Mediastinum   Hypertension    Chronic. Elevated in office today to 140/60. Will verify with home readings over next 3 days and may add Losartan '50mg'$  daily. He denies chest pain, shortness of breath, headaches, palpitations, lightheadedness, dizziness, encouraged to seek immediate medical care for any of these symptoms.        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 Denosumab started on 07/21/2018 for bone mets. Followed by Oncology every 3 months, last PSA 0.26.      Stage 3a chronic kidney disease (HCC)    Chronic, stable. Creatinine 1.33-1.56. Previously seeing nephrologist but not currently being followed, no records available, will place referral.      Relevant Orders   Ambulatory referral to Nephrology     Other   Physical exam, annual - Primary    Today your medical history was reviewed and routine physical exam with labs was performed. Recommend 150 minutes of moderate intensity exercise weekly and consuming a well-balanced diet. Advised to stop smoking if a smoker, avoid smoking if a non-smoker, limit alcohol consumption to 1 drink per day for women and 2 drinks per day for men, and avoid illicit drug use. Counseled in mental health awareness and when to seek medical care. Vaccine maintenance discussed. Appropriate  health maintenance items reviewed. Return to office in 1 year for annual physical exam.       Relevant Orders   Lipid panel   Other Visit Diagnoses     Hearing aid consultation       Relevant Orders   Ambulatory referral to ENT    Encounter for vision screening       Relevant Orders   Ambulatory referral to Optometry       Return in about 3 months (around 07/31/2022).   Rubie Maid, FNP

## 2022-05-02 ENCOUNTER — Other Ambulatory Visit: Payer: Medicaid Other

## 2022-05-03 ENCOUNTER — Other Ambulatory Visit (HOSPITAL_COMMUNITY): Payer: Self-pay | Admitting: Physician Assistant

## 2022-05-03 ENCOUNTER — Other Ambulatory Visit: Payer: Self-pay

## 2022-05-03 ENCOUNTER — Other Ambulatory Visit (HOSPITAL_COMMUNITY): Payer: Self-pay | Admitting: Hematology

## 2022-05-03 ENCOUNTER — Other Ambulatory Visit: Payer: Self-pay | Admitting: *Deleted

## 2022-05-03 ENCOUNTER — Other Ambulatory Visit (HOSPITAL_COMMUNITY): Payer: Self-pay

## 2022-05-03 DIAGNOSIS — C61 Malignant neoplasm of prostate: Secondary | ICD-10-CM

## 2022-05-03 MED ORDER — ABIRATERONE ACETATE 250 MG PO TABS
ORAL_TABLET | Freq: Every day | ORAL | 3 refills | Status: DC
  Filled 2022-05-03: qty 120, 30d supply, fill #0
  Filled 2022-05-29: qty 120, 30d supply, fill #1
  Filled 2022-06-29: qty 120, 30d supply, fill #2
  Filled 2022-07-24: qty 120, 30d supply, fill #3

## 2022-05-03 NOTE — Telephone Encounter (Signed)
Refill approved for Zytiga.  Patient is tolerating and is to continue therapy.

## 2022-05-08 ENCOUNTER — Encounter (HOSPITAL_COMMUNITY): Payer: Self-pay | Admitting: Hematology

## 2022-05-08 ENCOUNTER — Other Ambulatory Visit (HOSPITAL_COMMUNITY): Payer: Self-pay

## 2022-05-09 ENCOUNTER — Other Ambulatory Visit: Payer: Self-pay | Admitting: Hematology

## 2022-05-09 ENCOUNTER — Ambulatory Visit (INDEPENDENT_AMBULATORY_CARE_PROVIDER_SITE_OTHER): Payer: Medicaid Other | Admitting: Family Medicine

## 2022-05-09 VITALS — BP 122/64 | HR 59 | Temp 97.4°F | Ht 69.0 in | Wt 173.0 lb

## 2022-05-09 DIAGNOSIS — I499 Cardiac arrhythmia, unspecified: Secondary | ICD-10-CM | POA: Diagnosis not present

## 2022-05-09 DIAGNOSIS — H539 Unspecified visual disturbance: Secondary | ICD-10-CM

## 2022-05-09 DIAGNOSIS — I10 Essential (primary) hypertension: Secondary | ICD-10-CM | POA: Diagnosis not present

## 2022-05-09 DIAGNOSIS — C7951 Secondary malignant neoplasm of bone: Secondary | ICD-10-CM | POA: Insufficient documentation

## 2022-05-09 DIAGNOSIS — D573 Sickle-cell trait: Secondary | ICD-10-CM | POA: Insufficient documentation

## 2022-05-09 NOTE — Assessment & Plan Note (Signed)
Patient reports distortions in size perception and vague descriptions of vision changes. Will refer to ophthalmology for further evaluation.

## 2022-05-09 NOTE — Assessment & Plan Note (Signed)
Well controlled today in office. Home BP reading were elevated but it seems his cuff is inaccurate. Will continue to monitor. Counseled on weight management, heart healthy diet, and exercise. Seek medical care for chest pain, palpitations, shortness of breath, lightheadedness, dizziness, swelling of extremities, or headaches.

## 2022-05-09 NOTE — Assessment & Plan Note (Signed)
EKG in office showed NSR with PVCs. Denies symptoms.

## 2022-05-09 NOTE — Progress Notes (Signed)
Acute Office Visit  Subjective:     Patient ID: Carlos Harper, male    DOB: October 04, 1940, 82 y.o.   MRN: [id number]  Chief Complaint  Patient presents with   Follow-up    F/u bp recheck    HPI Patient is in today for blood pressure follow-up. His last office visit reading was 140/60, HR 93. His home readings were: 1/10 151/71, 1/11 148/75, 1/12 170/77, 1/13 138/68. Today in office his home BP cuff reading was 15 points higher than manual cuff and also gave error messages several times.  HYPERTENSION with Chronic Kidney Disease Hypertension status: uncontrolled  Satisfied with current treatment? yes Duration of hypertension: chronic BP monitoring frequency:  daily BP range:  BP medication side effects:  no Medication compliance: excellent compliance Previous BP meds:amlodipine Aspirin: no Recurrent headaches: no Visual changes: yes Palpitations: no Dyspnea: no Chest pain: no Lower extremity edema: no Dizzy/lightheaded: no   Review of Systems  All other systems reviewed and are negative.       Objective:    BP 122/64   Pulse (!) 59   Temp (!) 97.4 F (36.3 C) (Oral)   Ht '5\' 9"'$  (1.753 m)   Wt 173 lb (78.5 kg)   BMI 25.55 kg/m  BP Readings from Last 3 Encounters:  05/09/22 122/64  05/01/22 (!) 137/90  05/01/22 (!) 140/60      Physical Exam Vitals and nursing note reviewed.  Constitutional:      Appearance: Normal appearance. He is normal weight.  HENT:     Head: Normocephalic and atraumatic.  Cardiovascular:     Rate and Rhythm: Normal rate. Rhythm irregular.     Pulses: Normal pulses.     Heart sounds: Normal heart sounds.  Pulmonary:     Effort: Pulmonary effort is normal.     Breath sounds: Normal breath sounds.  Skin:    General: Skin is warm and dry.     Capillary Refill: Capillary refill takes less than 2 seconds.  Neurological:     General: No focal deficit present.     Mental Status: He is alert and oriented to person, place, and time.  Mental status is at baseline.  Psychiatric:        Mood and Affect: Mood normal.        Behavior: Behavior normal.        Thought Content: Thought content normal.        Judgment: Judgment normal.     No results found for any visits on 05/09/22.      Assessment & Plan:   Problem List Items Addressed This Visit       Cardiovascular and Mediastinum   Hypertension - Primary    Well controlled today in office. Home BP reading were elevated but it seems his cuff is inaccurate. Will continue to monitor. Counseled on weight management, heart healthy diet, and exercise. Seek medical care for chest pain, palpitations, shortness of breath, lightheadedness, dizziness, swelling of extremities, or headaches.        Other   Vision changes    Patient reports distortions in size perception and vague descriptions of vision changes. Will refer to ophthalmology for further evaluation.      Relevant Orders   Ambulatory referral to Ophthalmology   Irregular heart rate    EKG in office showed NSR with PVCs. Denies symptoms.      Relevant Orders   EKG 12-Lead (Completed)    No orders of the defined types were  placed in this encounter.   Return in about 3 months (around 08/08/2022) for blood-pressure follow up.  Rubie Maid, FNP

## 2022-05-19 IMAGING — NM NM BONE WHOLE BODY
2 series · 2 of 2 positions shown · non-contrast
Comparison: April 29, 2018

CLINICAL DATA: Prostate cancer surveillance.

EXAM:
NUCLEAR MEDICINE WHOLE BODY BONE SCAN
TECHNIQUE: Whole body anterior and posterior images were obtained approximately
3 hours after intravenous injection of radiopharmaceutical.
RADIOPHARMACEUTICALS:  21.5 mCi 3echnetium-WWm MDP IV

[Series 1: whole body · 2.66mm/px · 1 of 1 slices shown (1 of 2)]
[im 1/1]
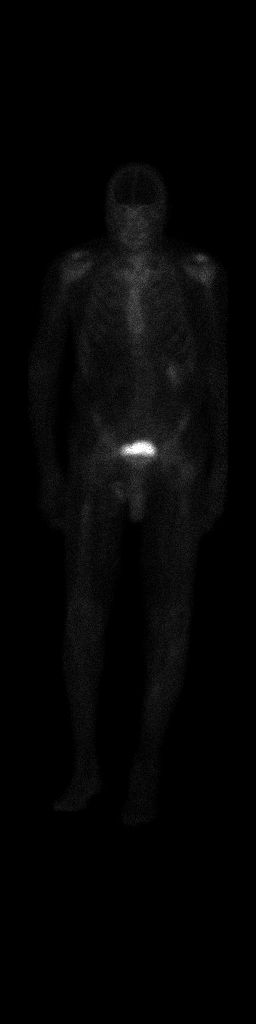

[Series 1: whole body · 2.66mm/px · 1 of 1 slices shown (2 of 2)]
[im 1/1]
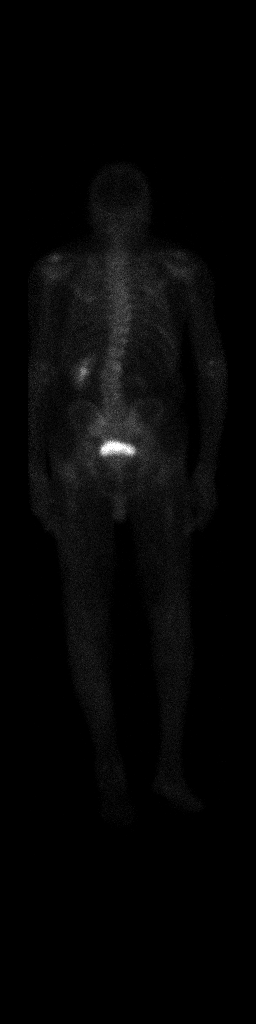

[2 of 2 positions shown; findings below may reference images not displayed]

FINDINGS: Again noted are extensive metastatic lesions throughout the
patient's visualized osseous structures including the ribs,
thoracolumbar spine, sternum, and bilateral femurs as well as the
left tibia. There is a probable lesion in the proximal left humerus.
The activity in many of these lesions appears to be overall improved
from prior study. The number and extent of osseous lesions appears
to be stable to improved. Radiotracer is again noted in the
collecting system as expected.
IMPRESSION: Similar distribution of osseous metastatic disease with overall
decreased activity when compared to prior study. There are no
definite new or growing metastatic lesions.

## 2022-05-25 ENCOUNTER — Other Ambulatory Visit (HOSPITAL_COMMUNITY): Payer: Self-pay

## 2022-05-28 ENCOUNTER — Inpatient Hospital Stay: Payer: Medicaid Other | Attending: Hematology

## 2022-05-28 DIAGNOSIS — C61 Malignant neoplasm of prostate: Secondary | ICD-10-CM | POA: Diagnosis present

## 2022-05-28 DIAGNOSIS — C7951 Secondary malignant neoplasm of bone: Secondary | ICD-10-CM | POA: Diagnosis present

## 2022-05-28 DIAGNOSIS — N189 Chronic kidney disease, unspecified: Secondary | ICD-10-CM | POA: Diagnosis not present

## 2022-05-28 DIAGNOSIS — D631 Anemia in chronic kidney disease: Secondary | ICD-10-CM | POA: Insufficient documentation

## 2022-05-28 DIAGNOSIS — G629 Polyneuropathy, unspecified: Secondary | ICD-10-CM | POA: Insufficient documentation

## 2022-05-28 DIAGNOSIS — I10 Essential (primary) hypertension: Secondary | ICD-10-CM | POA: Insufficient documentation

## 2022-05-28 LAB — CBC WITH DIFFERENTIAL/PLATELET
Abs Immature Granulocytes: 0.01 10*3/uL (ref 0.00–0.07)
Basophils Absolute: 0 10*3/uL (ref 0.0–0.1)
Basophils Relative: 0 %
Eosinophils Absolute: 0 10*3/uL (ref 0.0–0.5)
Eosinophils Relative: 1 %
HCT: 34.2 % — ABNORMAL LOW (ref 39.0–52.0)
Hemoglobin: 11.5 g/dL — ABNORMAL LOW (ref 13.0–17.0)
Immature Granulocytes: 0 %
Lymphocytes Relative: 36 %
Lymphs Abs: 1.8 10*3/uL (ref 0.7–4.0)
MCH: 31.3 pg (ref 26.0–34.0)
MCHC: 33.6 g/dL (ref 30.0–36.0)
MCV: 93.2 fL (ref 80.0–100.0)
Monocytes Absolute: 0.4 10*3/uL (ref 0.1–1.0)
Monocytes Relative: 8 %
Neutro Abs: 2.7 10*3/uL (ref 1.7–7.7)
Neutrophils Relative %: 55 %
Platelets: 207 10*3/uL (ref 150–400)
RBC: 3.67 MIL/uL — ABNORMAL LOW (ref 4.22–5.81)
RDW: 13.2 % (ref 11.5–15.5)
WBC: 4.8 10*3/uL (ref 4.0–10.5)
nRBC: 0 % (ref 0.0–0.2)

## 2022-05-28 LAB — COMPREHENSIVE METABOLIC PANEL
ALT: 13 U/L (ref 0–44)
AST: 19 U/L (ref 15–41)
Albumin: 3.7 g/dL (ref 3.5–5.0)
Alkaline Phosphatase: 46 U/L (ref 38–126)
Anion gap: 7 (ref 5–15)
BUN: 21 mg/dL (ref 8–23)
CO2: 26 mmol/L (ref 22–32)
Calcium: 9.2 mg/dL (ref 8.9–10.3)
Chloride: 105 mmol/L (ref 98–111)
Creatinine, Ser: 1.44 mg/dL — ABNORMAL HIGH (ref 0.61–1.24)
GFR, Estimated: 49 mL/min — ABNORMAL LOW (ref >60)
Glucose, Bld: 96 mg/dL (ref 70–99)
Potassium: 3.9 mmol/L (ref 3.5–5.1)
Sodium: 138 mmol/L (ref 135–145)
Total Bilirubin: 1.3 mg/dL — ABNORMAL HIGH (ref 0.3–1.2)
Total Protein: 6.4 g/dL — ABNORMAL LOW (ref 6.5–8.1)

## 2022-05-28 LAB — PSA: Prostatic Specific Antigen: 0.14 ng/mL (ref 0.00–4.00)

## 2022-05-29 ENCOUNTER — Encounter: Payer: Self-pay | Admitting: Hematology

## 2022-05-29 ENCOUNTER — Inpatient Hospital Stay: Payer: Medicaid Other | Admitting: Hematology

## 2022-05-29 ENCOUNTER — Inpatient Hospital Stay: Payer: Medicaid Other

## 2022-05-29 ENCOUNTER — Other Ambulatory Visit (HOSPITAL_COMMUNITY): Payer: Self-pay

## 2022-05-29 VITALS — BP 139/75 | HR 60 | Temp 98.0°F | Resp 18 | Wt 171.3 lb

## 2022-05-29 DIAGNOSIS — C61 Malignant neoplasm of prostate: Secondary | ICD-10-CM | POA: Diagnosis not present

## 2022-05-29 DIAGNOSIS — C7951 Secondary malignant neoplasm of bone: Secondary | ICD-10-CM | POA: Diagnosis not present

## 2022-05-29 MED ORDER — DENOSUMAB 120 MG/1.7ML ~~LOC~~ SOLN
120.0000 mg | Freq: Once | SUBCUTANEOUS | Status: AC
  Administered 2022-05-29: 120 mg via SUBCUTANEOUS
  Filled 2022-05-29: qty 1.7

## 2022-05-29 NOTE — Patient Instructions (Signed)
[place] at [facility] Discharge Instructions   You were seen and examined today by Dr. Delton Coombes.  He reviewed the results of your lab work which are normal/stable.   Continue Zytiga and prednisone as prescribed.   We will give your Xgeva injection today.   Return as scheduled.    Thank you for choosing [place] at [facility] to provide your oncology and hematology care.  To afford each patient quality time with our provider, please arrive at least 15 minutes before your scheduled appointment time.   If you have a lab appointment with the [facility] please come in thru the Main Entrance and check in at the main information desk.  You need to re-schedule your appointment should you arrive 10 or more minutes late.  We strive to give you quality time with our providers, and arriving late affects you and other patients whose appointments are after yours.  Also, if you no show three or more times for appointments you may be dismissed from the clinic at the providers discretion.     Again, thank you for choosing [facility].  Our hope is that these requests will decrease the amount of time that you wait before being seen by our physicians.       _____________________________________________________________  Should you have questions after your visit to [facility], please contact our office at [phone] and follow the prompts.  Our office hours are 8:00 a.m. and 4:30 p.m. Monday - Friday.  Please note that voicemails left after 4:00 p.m. may not be returned until the following business day.  We are closed weekends and major holidays.  You do have access to a nurse 24-7, just call the main number to the clinic [phone] and do not press any options, hold on the line and a nurse will answer the phone.    For prescription refill requests, have your pharmacy contact our office and allow 72 hours.    Due to  Covid, you will need to wear a mask upon entering the hospital. If you do not have a mask, a mask will be given to you at the Main Entrance upon arrival. For doctor visits, patients may have 1 support person age 84 or older with them. For treatment visits, patients can not have anyone with them due to social distancing guidelines and our immunocompromised population.

## 2022-05-29 NOTE — Progress Notes (Signed)
Patient taking calcium as directed. Denied tooth, jaw, and leg pain. No recent or upcoming dental visits. Labs reviewed. Patient tolerated injection with no complaints voiced. See MAR for details. Patient stable during and after injection. Site clean and dry with no bruising or swelling noted. Band aid applied. Vss with discharge and left in satisfactory condition with no s/s of distress. sits. Labs reviewed.

## 2022-05-29 NOTE — Progress Notes (Signed)
[place], [place] [address]   CLINIC:  Medical Oncology/Hematology  PCP:  Rubie Maid, FNP [address] [place] / [place] [address] [phone]   REASON FOR VISIT:  Follow-up for metastatic prostate cancer  PRIOR THERAPY:  1. Bilateral orchiectomy on 09/17/2017. 2. Docetaxel x 6 cycles from 11/19/2017 to 03/05/2018  NGS Results: not done  CURRENT THERAPY: Zytiga 1,000 mg daily; Xgeva monthly  BRIEF ONCOLOGIC HISTORY:  Oncology History  Malignant neoplasm of prostate ([facility])  09/27/2017 Initial Diagnosis   Malignant neoplasm of prostate ([facility])   11/19/2017 - 03/07/2018 Chemotherapy   The patient had pegfilgrastim (NEULASTA) injection 6 mg, 6 mg, Subcutaneous, Once, 6 of 6 cycles Administration: 6 mg (11/21/2017), 6 mg (12/12/2017), 6 mg (01/02/2018), 6 mg (01/24/2018), 6 mg (02/14/2018), 6 mg (03/07/2018) pegfilgrastim (NEULASTA ONPRO KIT) injection 6 mg, 6 mg, Subcutaneous, Once, 2 of 2 cycles DOCEtaxel (TAXOTERE) 140 mg in sodium chloride 0.9 % 250 mL chemo infusion, 75 mg/m2 = 140 mg, Intravenous,  Once, 6 of 6 cycles Administration: 140 mg (11/19/2017), 140 mg (12/10/2017), 140 mg (01/22/2018), 140 mg (12/31/2017), 140 mg (02/12/2018), 140 mg (03/05/2018) ondansetron (ZOFRAN) 8 mg, dexamethasone (DECADRON) 10 mg in sodium chloride 0.9 % 50 mL IVPB, , Intravenous,  Once, 4 of 4 cycles Administration:  (12/31/2017),  (01/22/2018),  (02/12/2018),  (03/05/2018)  for chemotherapy treatment.      CANCER STAGING:  Cancer Staging  No matching staging information was found for the patient.  INTERVAL HISTORY:  Carlos Harper, a 82 y.o. male, seen for follow-up of metastatic colon cancer.  Reports energy levels are 50%.  Numbness in the feet has been stable.  Denies any new onset pains.  Denies any fevers or infections since last visit.  REVIEW OF SYSTEMS:  Review of Systems  Constitutional:  Negative for appetite change and fatigue.   Gastrointestinal:  Negative for constipation, diarrhea, nausea and vomiting.  Musculoskeletal:  Negative for arthralgias.  Neurological:  Positive for numbness.  All other systems reviewed and are negative.   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 ([facility]) 09/27/2017   Sickle cell trait ([facility])    Vertigo    Past Surgical History:  Procedure Laterality Date   CYSTOSCOPY WITH FULGERATION  09/17/2017   Procedure: CYSTOSCOPY WITH FULGERATION OF BLADDER NECK;  Surgeon: [name], MD;  [facility];  Service: Urology;;   CYSTOSCOPY WITH STENT PLACEMENT Right 09/17/2017   Procedure: CYSTOSCOPY WITH RIGHT RETROGRADE PYELOGRAM ATTEMPTED STENT PLACEMENT;  Surgeon: [name], MD;  [facility];  Service: Urology;  Laterality: Right;   left leg surgery due to [place] Bilateral 09/17/2017   Procedure: ORCHIECTOMY;  Surgeon: [name], MD;  [facility];  Service: Urology;  Laterality: Bilateral;   PORTACATH PLACEMENT Left 10/14/2017   Procedure: INSERTION PORT-A-CATH;  Surgeon: Aviva Signs, MD;  Location: [facility];  Service: General;  Laterality: Left;   PROSTATE BIOPSY N/A 09/17/2017   Procedure: BIOPSY TRANSRECTAL ULTRASONIC PROSTATE (TUBP);  Surgeon: [name], MD;  [facility];  Service: Urology;  Laterality: N/A;    SOCIAL HISTORY:  Social History   Socioeconomic History   Marital status: Single    Spouse name: Not on file   Number of children: Not on file   Years of education: Not on  file   Highest education level: Not on file  Occupational History   Not on file  Tobacco Use   Smoking status: Never   Smokeless tobacco: Never  Vaping Use   Vaping Use: Never used  Substance and Sexual Activity   Alcohol use: Yes    Comment: Drinks beer occasionally    Drug use: No   Sexual activity: Not Currently  Other Topics Concern   Not on file   Social History Narrative   Not on file   Social Determinants of Health   Financial Resource Strain: Low Risk  (03/31/2020)   Overall Financial Resource Strain (CARDIA)    Difficulty of Paying Living Expenses: Not hard at all  Food Insecurity: No Food Insecurity (03/31/2020)   Hunger Vital Sign    Worried About Running Out of Food in the Last Year: Never true    Ran Out of Food in the Last Year: Never true  Transportation Needs: No Transportation Needs (03/31/2020)   PRAPARE - Hydrologist (Medical): No    Lack of Transportation (Non-Medical): No  Physical Activity: Inactive (03/31/2020)   Exercise Vital Sign    Days of Exercise per Week: 0 days    Minutes of Exercise per Session: 0 min  Stress: No Stress Concern Present (03/31/2020)   [place]    Feeling of Stress : Not at all  Social Connections: Moderately Isolated (03/31/2020)   Social Connection and Isolation Panel [NHANES]    Frequency of Communication with Friends and Family: More than three times a week    Frequency of Social Gatherings with Friends and Family: More than three times a week    Attends Religious Services: More than 4 times per year    Active Member of [employer] or Organizations: No    Attends Archivist Meetings: Never    Marital Status: Divorced  Human resources officer Violence: Not At Risk (03/31/2020)   Humiliation, Afraid, Rape, and Kick questionnaire    Fear of Current or Ex-Partner: No    Emotionally Abused: No    Physically Abused: No    Sexually Abused: No    FAMILY HISTORY:  Family History  Problem Relation Age of Onset   Hypertension Mother    Lung cancer Father    Lung cancer Brother    HIV/AIDS Brother    Breast cancer Daughter     CURRENT MEDICATIONS:  Current Outpatient Medications  Medication Sig Dispense Refill   abiraterone acetate (ZYTIGA) 250 MG tablet TAKE 4 TABLETS (1,000 MG TOTAL)  BY MOUTH DAILY. TAKE ON AN EMPTY STOMACH 1 HOUR BEFORE OR 2 HOURS AFTER A MEAL 120 tablet 3   amLODipine (NORVASC) 5 MG tablet Take 1 tablet (5 mg total) by mouth daily. 90 tablet 0   docusate sodium (COLACE) 100 MG capsule Take 100 mg by mouth daily.     gabapentin (NEURONTIN) 300 MG capsule TAKE 1 CAPSULE BY MOUTH EVERYDAY AT BEDTIME 30 capsule 1   Misc. Devices MISC Please provide patient with a rollaider that has a seat. 1 each 0   predniSONE (DELTASONE) 5 MG tablet TAKE 1 TABLET BY MOUTH ONCE A DAY WITH BREAKFAST 30 tablet 6   No current facility-administered medications for this visit.   Facility-Administered Medications Ordered in Other Visits  Medication Dose Route Frequency Provider Last Rate Last Admin   lanreotide acetate (SOMATULINE DEPOT) 120 MG/0.5ML injection  ALLERGIES:  No Known Allergies  PHYSICAL EXAM:  Performance status (ECOG): 1 - Symptomatic but completely ambulatory  There were no vitals filed for this visit.  Wt Readings from Last 3 Encounters:  05/09/22 173 lb (78.5 kg)  05/01/22 175 lb (79.4 kg)  03/06/22 172 lb 3.2 oz (78.1 kg)   Physical Exam Vitals reviewed.  Constitutional:      Appearance: Normal appearance.  Cardiovascular:     Rate and Rhythm: Normal rate and regular rhythm.     Pulses: Normal pulses.     Heart sounds: Normal heart sounds.  Pulmonary:     Effort: Pulmonary effort is normal.     Breath sounds: Normal breath sounds.  Neurological:     General: No focal deficit present.     Mental Status: He is alert and oriented to person, place, and time.  Psychiatric:        Mood and Affect: Mood normal.        Behavior: Behavior normal.     LABORATORY DATA:  I have reviewed the labs as listed.     Latest Ref Rng & Units 05/28/2022   10:37 AM 03/05/2022    8:07 AM 12/12/2021    9:49 AM  CBC  WBC 4.0 - 10.5 K/uL 4.8  4.8  5.0   Hemoglobin 13.0 - 17.0 g/dL 11.5  11.4  11.5   Hematocrit 39.0 - 52.0 % 34.2  33.6  35.1    Platelets 150 - 400 K/uL 207  203  145       Latest Ref Rng & Units 05/28/2022   10:37 AM 05/01/2022   11:56 AM 04/03/2022   10:42 AM  CMP  Glucose 70 - 99 mg/dL 96  95  92   BUN 8 - 23 mg/dL '21  22  18   '$ Creatinine 0.61 - 1.24 mg/dL 1.44  1.37  1.42   Sodium 135 - 145 mmol/L 138  138  142   Potassium 3.5 - 5.1 mmol/L 3.9  3.8  3.9   Chloride 98 - 111 mmol/L 105  104  107   CO2 22 - 32 mmol/L '26  25  26   '$ Calcium 8.9 - 10.3 mg/dL 9.2  9.2  9.6   Total Protein 6.5 - 8.1 g/dL 6.4  7.0  7.2   Total Bilirubin 0.3 - 1.2 mg/dL 1.3  1.3  0.8   Alkaline Phos 38 - 126 U/L 46  47  55   AST 15 - 41 U/L '19  19  17   '$ ALT 0 - 44 U/L '13  12  11     '$ DIAGNOSTIC IMAGING:  I have independently reviewed the scans and discussed with the patient. No results found.   ASSESSMENT:  1.  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 -6 cycles of docetaxel from 11/19/2017 through 03/05/2018. -Abiraterone and prednisone started on 05/07/2018.  PSA was 4.52.   2.  Bone metastasis: -Denosumab started on 07/21/2018.      PLAN:  1.  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 - Labs today shows normal potassium and LFTs.  CBC was grossly normal.  Blood pressure is 139/75. - PSA 0.14, down from 0.26 previously. - Recommend continuing Abiraterone 1000 mg daily and prednisone 5 mg daily.   2.  Bone metastasis: - Calcium is normal today.  Continue calcium supplements.  Continue denosumab monthly.  3.  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  Hemoglobin is 11.5.  If any worsening, will check ferritin and iron panel.   4.  Peripheral neuropathy: - Neuropathy in the feet is stable.  Continue gabapentin.   5.  CKD: - Baseline creatinine between 1.3-1.6.  Creatinine is 1.44.  6.  Hypertension: - Continue Norvasc 5 mg  daily.  Blood pressure is 139/75.   Orders placed this encounter:  No orders of the defined types were placed in this encounter.    Derek Jack, MD [place] [phone]

## 2022-05-29 NOTE — Patient Instructions (Signed)
[facility]  Discharge Instructions: Thank you for choosing [facility] to provide your oncology and hematology care.  If you have a lab appointment with the Cancer Center, please come in thru the Main Entrance and check in at the main information desk.  Wear comfortable clothing and clothing appropriate for easy access to any Portacath or PICC line.   We strive to give you quality time with your provider. You may need to reschedule your appointment if you arrive late (15 or more minutes).  Arriving late affects you and other patients whose appointments are after yours.  Also, if you miss three or more appointments without notifying the office, you may be dismissed from the clinic at the provider's discretion.      For prescription refill requests, have your pharmacy contact our office and allow 72 hours for refills to be completed.    Today you received the following Xgeva, return as scheduled.   To help prevent nausea and vomiting after your treatment, we encourage you to take your nausea medication as directed.  BELOW ARE SYMPTOMS THAT SHOULD BE REPORTED IMMEDIATELY: *FEVER GREATER THAN 100.4 F (38 C) OR HIGHER *CHILLS OR SWEATING *NAUSEA AND VOMITING THAT IS NOT CONTROLLED WITH YOUR NAUSEA MEDICATION *UNUSUAL SHORTNESS OF BREATH *UNUSUAL BRUISING OR BLEEDING *URINARY PROBLEMS (pain or burning when urinating, or frequent urination) *BOWEL PROBLEMS (unusual diarrhea, constipation, pain near the anus) TENDERNESS IN MOUTH AND THROAT WITH OR WITHOUT PRESENCE OF ULCERS (sore throat, sores in mouth, or a toothache) UNUSUAL RASH, SWELLING OR PAIN  UNUSUAL VAGINAL DISCHARGE OR ITCHING   Items with * indicate a potential emergency and should be followed up as soon as possible or go to the Emergency Department if any problems should occur.  Please show the CHEMOTHERAPY ALERT CARD or IMMUNOTHERAPY ALERT CARD at check-in to the Emergency Department and triage  nurse.  Should you have questions after your visit or need to cancel or reschedule your appointment, please contact [facility] AT  [phone]  and follow the prompts.  Office hours are 8:00 a.m. to 4:30 p.m. Monday - Friday. Please note that voicemails left after 4:00 p.m. may not be returned until the following business day.  We are closed weekends and major holidays. You have access to a nurse at all times for urgent questions. Please call the main number to the clinic [phone] and follow the prompts.  For any non-urgent questions, you may also contact your provider using MyChart. We now offer e-Visits for anyone 18 and older to request care online for non-urgent symptoms. For details visit [email or web address].   Also download the MyChart app! Go to the app store, search "MyChart", open the app, select [facility], and log in with your MyChart username and password.   

## 2022-05-29 NOTE — Progress Notes (Unsigned)
Patient is taking Zytiga as prescribed.  He has not missed any doses and reports no side effects at this time.

## 2022-05-30 ENCOUNTER — Encounter (HOSPITAL_COMMUNITY): Payer: Self-pay | Admitting: Hematology

## 2022-05-31 ENCOUNTER — Encounter (HOSPITAL_COMMUNITY): Payer: Self-pay | Admitting: Hematology

## 2022-05-31 ENCOUNTER — Other Ambulatory Visit: Payer: Self-pay

## 2022-05-31 ENCOUNTER — Other Ambulatory Visit (HOSPITAL_COMMUNITY): Payer: Self-pay

## 2022-05-31 MED ORDER — PREDNISONE 5 MG PO TABS
5.0000 mg | ORAL_TABLET | Freq: Every day | ORAL | 6 refills | Status: DC
  Filled 2022-05-31: qty 30, 30d supply, fill #0
  Filled 2022-06-29: qty 30, 30d supply, fill #1
  Filled 2022-07-24: qty 30, 30d supply, fill #2
  Filled 2022-09-20: qty 30, 30d supply, fill #3
  Filled 2022-10-19: qty 30, 30d supply, fill #4
  Filled 2022-11-12: qty 30, 30d supply, fill #5
  Filled 2022-12-14 – 2022-12-25 (×2): qty 30, 30d supply, fill #6

## 2022-06-04 ENCOUNTER — Other Ambulatory Visit: Payer: Self-pay

## 2022-06-07 ENCOUNTER — Other Ambulatory Visit: Payer: Self-pay | Admitting: Hematology

## 2022-06-26 ENCOUNTER — Other Ambulatory Visit (HOSPITAL_COMMUNITY): Payer: Self-pay

## 2022-06-29 ENCOUNTER — Other Ambulatory Visit (HOSPITAL_COMMUNITY): Payer: Self-pay

## 2022-06-29 ENCOUNTER — Inpatient Hospital Stay: Payer: Medicaid Other

## 2022-06-29 ENCOUNTER — Inpatient Hospital Stay: Payer: Medicaid Other | Attending: Hematology

## 2022-06-29 VITALS — BP 123/72 | HR 80 | Temp 97.2°F | Resp 18 | Wt 151.2 lb

## 2022-06-29 DIAGNOSIS — C61 Malignant neoplasm of prostate: Secondary | ICD-10-CM

## 2022-06-29 DIAGNOSIS — C7951 Secondary malignant neoplasm of bone: Secondary | ICD-10-CM | POA: Insufficient documentation

## 2022-06-29 LAB — COMPREHENSIVE METABOLIC PANEL
ALT: 13 U/L (ref 0–44)
AST: 22 U/L (ref 15–41)
Albumin: 3.7 g/dL (ref 3.5–5.0)
Alkaline Phosphatase: 51 U/L (ref 38–126)
Anion gap: 7 (ref 5–15)
BUN: 22 mg/dL (ref 8–23)
CO2: 27 mmol/L (ref 22–32)
Calcium: 8.9 mg/dL (ref 8.9–10.3)
Chloride: 104 mmol/L (ref 98–111)
Creatinine, Ser: 1.33 mg/dL — ABNORMAL HIGH (ref 0.61–1.24)
GFR, Estimated: 53 mL/min — ABNORMAL LOW (ref >60)
Glucose, Bld: 93 mg/dL (ref 70–99)
Potassium: 3.7 mmol/L (ref 3.5–5.1)
Sodium: 138 mmol/L (ref 135–145)
Total Bilirubin: 0.9 mg/dL (ref 0.3–1.2)
Total Protein: 6.9 g/dL (ref 6.5–8.1)

## 2022-06-29 MED ORDER — DENOSUMAB 120 MG/1.7ML ~~LOC~~ SOLN
120.0000 mg | Freq: Once | SUBCUTANEOUS | Status: AC
  Administered 2022-06-29: 120 mg via SUBCUTANEOUS
  Filled 2022-06-29: qty 1.7

## 2022-06-29 NOTE — Patient Instructions (Signed)
[facility]  Discharge Instructions: Thank you for choosing [facility] to provide your oncology and hematology care.  If you have a lab appointment with the [facility], please come in thru the Main Entrance and check in at the main information desk.  Wear comfortable clothing and clothing appropriate for easy access to any Portacath or PICC line.   We strive to give you quality time with your provider. You may need to reschedule your appointment if you arrive late (15 or more minutes).  Arriving late affects you and other patients whose appointments are after yours.  Also, if you miss three or more appointments without notifying the office, you may be dismissed from the clinic at the provider's discretion.      For prescription refill requests, have your pharmacy contact our office and allow 72 hours for refills to be completed.    Today you received the following, xgeva injection   To help prevent nausea and vomiting after your treatment, we encourage you to take your nausea medication as directed.  BELOW ARE SYMPTOMS THAT SHOULD BE REPORTED IMMEDIATELY: *FEVER GREATER THAN 100.4 F (38 C) OR HIGHER *CHILLS OR SWEATING *NAUSEA AND VOMITING THAT IS NOT CONTROLLED WITH YOUR NAUSEA MEDICATION *UNUSUAL SHORTNESS OF BREATH *UNUSUAL BRUISING OR BLEEDING *URINARY PROBLEMS (pain or burning when urinating, or frequent urination) *BOWEL PROBLEMS (unusual diarrhea, constipation, pain near the anus) TENDERNESS IN MOUTH AND THROAT WITH OR WITHOUT PRESENCE OF ULCERS (sore throat, sores in mouth, or a toothache) UNUSUAL RASH, SWELLING OR PAIN  UNUSUAL VAGINAL DISCHARGE OR ITCHING   Items with * indicate a potential emergency and should be followed up as soon as possible or go to the Emergency Department if any problems should occur.  Please show the CHEMOTHERAPY ALERT CARD or IMMUNOTHERAPY ALERT CARD at check-in to the Emergency Department and triage  nurse.  Should you have questions after your visit or need to cancel or reschedule your appointment, please contact [facility] [phone]  and follow the prompts.  Office hours are 8:00 a.m. to 4:30 p.m. Monday - Friday. Please note that voicemails left after 4:00 p.m. may not be returned until the following business day.  We are closed weekends and major holidays. You have access to a nurse at all times for urgent questions. Please call the main number to the clinic [phone] and follow the prompts.  For any non-urgent questions, you may also contact your provider using MyChart. We now offer e-Visits for anyone 51 and older to request care online for non-urgent symptoms. For details visit [email or web address].   Also download the MyChart app! Go to the app store, search "MyChart", open the app, select [facility], and log in with your MyChart username and password.

## 2022-06-29 NOTE — Progress Notes (Signed)
Xgeva injection  given per orders. Patient tolerated it well without problems. Vitals stable and discharged home from clinic ambulatory. Follow up as scheduled.  

## 2022-07-02 ENCOUNTER — Other Ambulatory Visit: Payer: Self-pay

## 2022-07-09 ENCOUNTER — Encounter: Payer: Self-pay | Admitting: Family Medicine

## 2022-07-09 ENCOUNTER — Ambulatory Visit (INDEPENDENT_AMBULATORY_CARE_PROVIDER_SITE_OTHER): Payer: Medicaid Other | Admitting: Family Medicine

## 2022-07-09 ENCOUNTER — Other Ambulatory Visit: Payer: Self-pay | Admitting: Hematology

## 2022-07-09 VITALS — BP 138/82 | HR 65 | Temp 98.1°F | Ht 69.0 in | Wt 174.0 lb

## 2022-07-09 DIAGNOSIS — L0211 Cutaneous abscess of neck: Secondary | ICD-10-CM

## 2022-07-09 DIAGNOSIS — L0291 Cutaneous abscess, unspecified: Secondary | ICD-10-CM | POA: Insufficient documentation

## 2022-07-09 NOTE — Assessment & Plan Note (Signed)
0.5 in round abscess to left anterior neck. I&D performed in office, see notes. Instructed to remove 1/2in packing daily until fully removed and cover with clean dressing. Report to office for signs of infections such as fever, chills, body aches, purulent drainage, or if symptoms not improved.

## 2022-07-09 NOTE — Patient Instructions (Signed)
Remove 1/2 inch of packing gauze each day and cut with scissors until completely removed. 3 inches of packing total.

## 2022-07-09 NOTE — Progress Notes (Signed)
Acute Office Visit  Subjective:     Patient ID: Carlos Harper, male    DOB: [birth year], 82 y.o.   MRN: [id number]  Chief Complaint  Patient presents with   Follow-up    knott on neck near throat; area sore - JBG\\\      HPI Patient is in today for a sore knot on his throat for several days. He reports tenderness, no drainage, no fevers. He has a similar knot on the back of his neck as well.  I & D  Date/Time: 07/09/2022 4:38 PM  Performed by: Rubie Maid, FNP Authorized by: Rubie Maid, FNP   Consent:    Consent obtained:  Verbal   Consent given by:  Patient   Risks, benefits, and alternatives were discussed: yes     Risks discussed:  Bleeding, incomplete drainage, pain and infection   Alternatives discussed:  No treatment Location:    Type:  Cyst   Size:  0.5 x 0.5in   Location:  Neck   Neck location:  L anterior Pre-procedure details:    Skin preparation:  Povidone-iodine Sedation:    Sedation type:  None Anesthesia:    Anesthesia method:  Local infiltration Procedure type:    Complexity:  Simple Procedure details:    Incision types:  Stab incision   Incision depth:  Dermal   Wound management:  Probed and deloculated   Drainage:  Purulent and bloody   Drainage amount:  Moderate   Packing materials:  1/2 in gauze   Amount 1/2":  3in Post-procedure details:    Procedure completion:  Tolerated well, no immediate complications    Review of Systems  All other systems reviewed and are negative.    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 ([place] [facility]) 09/27/2017   Sickle cell trait ([facility])    Vertigo    Past Surgical History:  Procedure Laterality Date   CYSTOSCOPY WITH FULGERATION  09/17/2017   Procedure: CYSTOSCOPY WITH FULGERATION OF BLADDER NECK;  Surgeon: [name], MD;  [facility];  Service: Urology;;   CYSTOSCOPY WITH STENT PLACEMENT Right 09/17/2017   Procedure: CYSTOSCOPY  WITH RIGHT RETROGRADE PYELOGRAM ATTEMPTED STENT PLACEMENT;  Surgeon: [name], MD;  [facility];  Service: Urology;  Laterality: Right;   left leg surgery due to [place] Bilateral 09/17/2017   Procedure: ORCHIECTOMY;  Surgeon: [name], MD;  [facility];  Service: Urology;  Laterality: Bilateral;   PORTACATH PLACEMENT Left 10/14/2017   Procedure: INSERTION PORT-A-CATH;  Surgeon: Aviva Signs, MD;  [facility];  Service: General;  Laterality: Left;   PROSTATE BIOPSY N/A 09/17/2017   Procedure: BIOPSY TRANSRECTAL ULTRASONIC PROSTATE (TUBP);  Surgeon: [name], MD;  [facility];  Service: Urology;  Laterality: N/A;   Current Outpatient Medications on File Prior to Visit  Medication Sig Dispense Refill   abiraterone acetate (ZYTIGA) 250 MG tablet TAKE 4 TABLETS (1,000 MG TOTAL) BY MOUTH DAILY. TAKE ON AN EMPTY STOMACH 1 HOUR BEFORE OR 2 HOURS AFTER A MEAL 120 tablet 3   amLODipine (NORVASC) 5 MG tablet TAKE 1 TABLET (5 MG TOTAL) BY MOUTH DAILY. 90 tablet 0   docusate sodium (COLACE) 100 MG capsule Take 100 mg by mouth daily.     gabapentin (NEURONTIN) 300 MG capsule TAKE 1 CAPSULE BY MOUTH EVERYDAY AT BEDTIME 30 capsule 1   Misc.  Devices MISC Please provide patient with a rollaider that has a seat. 1 each 0   predniSONE (DELTASONE) 5 MG tablet Take 1 tablet (5 mg total) by mouth daily with breakfast. 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 ([facility]) 09/27/2017   Sickle cell trait ([facility])    Vertigo    Past Surgical History:  Procedure Laterality Date   CYSTOSCOPY WITH FULGERATION  09/17/2017   Procedure: CYSTOSCOPY WITH FULGERATION OF BLADDER  NECK;  Surgeon: [name], MD;  [facility];  Service: Urology;;   CYSTOSCOPY WITH STENT PLACEMENT Right 09/17/2017   Procedure: CYSTOSCOPY WITH RIGHT RETROGRADE PYELOGRAM ATTEMPTED STENT PLACEMENT;  Surgeon: [name], MD;  [facility];  Service: Urology;  Laterality: Right;   left leg surgery due to [place] Bilateral 09/17/2017   Procedure: ORCHIECTOMY;  Surgeon: [name], MD;  [facility];  Service: Urology;  Laterality: Bilateral;   PORTACATH PLACEMENT Left 10/14/2017   Procedure: INSERTION PORT-A-CATH;  Surgeon: Aviva Signs, MD;  [facility];  Service: General;  Laterality: Left;   PROSTATE BIOPSY N/A 09/17/2017   Procedure: BIOPSY TRANSRECTAL ULTRASONIC PROSTATE (TUBP);  Surgeon: [name], MD;  [facility];  Service: Urology;  Laterality: N/A;   Current Outpatient Medications on File Prior to Visit  Medication Sig Dispense Refill   abiraterone acetate (ZYTIGA) 250 MG tablet TAKE 4 TABLETS (1,000 MG TOTAL) BY MOUTH DAILY. TAKE ON AN EMPTY STOMACH 1 HOUR BEFORE OR 2 HOURS AFTER A MEAL 120 tablet 3   amLODipine (NORVASC) 5 MG tablet TAKE 1 TABLET (5 MG TOTAL) BY MOUTH DAILY. 90 tablet 0   docusate sodium (COLACE) 100 MG capsule Take 100 mg by mouth daily.     gabapentin (NEURONTIN) 300 MG capsule TAKE 1 CAPSULE BY MOUTH EVERYDAY AT BEDTIME 30 capsule 1   Misc. Devices MISC Please provide patient with a rollaider that has a seat. 1 each 0   predniSONE (DELTASONE) 5 MG tablet Take 1 tablet (5 mg total) by mouth daily with breakfast. 30 tablet 6   Current Facility-Administered Medications on File Prior to Visit  Medication Dose Route Frequency Provider Last Rate Last Admin   lanreotide acetate (SOMATULINE DEPOT) 120 MG/0.5ML injection            No Known Allergies     Objective:    BP 138/82   Pulse 65   Temp 98.1 F (36.7 C) (Oral)   Ht 5\' 9"  (1.753 m)   Wt 174 lb (78.9 kg)   SpO2 98%   BMI 25.70 kg/m    Physical  Exam Vitals and nursing note reviewed.  Constitutional:      Appearance: Normal appearance. He is normal weight.  HENT:     Head: Normocephalic and atraumatic.  Skin:    General: Skin is warm and dry.     Capillary Refill: Capillary refill takes less than 2 seconds.     Findings: Abscess present.          Comments: Sebaceous cyst to posterior neck. 0.5 inch round, red, tender, warm, mobile cyst to anterior neck.  Neurological:     General: No focal deficit present.     Mental Status: He is alert and oriented to person, place, and time. Mental status is at baseline.  Psychiatric:        Mood and Affect: Mood normal.        Behavior: Behavior normal.        Thought Content: Thought content normal.        Judgment: Judgment normal.     No results found for any visits on 07/09/22.      Assessment & Plan:   Problem List Items Addressed This Visit       Other   Abscess - Primary    0.5 in round abscess to left anterior neck. I&D performed in office, see notes. Instructed to remove 1/2in packing daily until fully removed and cover with clean dressing. Report to office for signs of infections such as fever, chills, body aches, purulent drainage, or if symptoms not improved.       No orders of the defined types were placed in this encounter.   Return if symptoms worsen or fail to improve.  Rubie Maid, FNP

## 2022-07-10 DIAGNOSIS — H25813 Combined forms of age-related cataract, bilateral: Secondary | ICD-10-CM | POA: Diagnosis not present

## 2022-07-10 DIAGNOSIS — H524 Presbyopia: Secondary | ICD-10-CM | POA: Diagnosis not present

## 2022-07-10 DIAGNOSIS — D23111 Other benign neoplasm of skin of right upper eyelid, including canthus: Secondary | ICD-10-CM | POA: Diagnosis not present

## 2022-07-24 ENCOUNTER — Other Ambulatory Visit (HOSPITAL_COMMUNITY): Payer: Self-pay

## 2022-07-27 ENCOUNTER — Inpatient Hospital Stay: Payer: Medicaid Other

## 2022-07-27 ENCOUNTER — Inpatient Hospital Stay: Payer: Medicaid Other | Attending: Hematology

## 2022-07-27 VITALS — BP 128/84 | HR 63 | Temp 97.6°F | Resp 16

## 2022-07-27 DIAGNOSIS — C7951 Secondary malignant neoplasm of bone: Secondary | ICD-10-CM | POA: Diagnosis present

## 2022-07-27 DIAGNOSIS — C61 Malignant neoplasm of prostate: Secondary | ICD-10-CM | POA: Insufficient documentation

## 2022-07-27 LAB — COMPREHENSIVE METABOLIC PANEL
ALT: 12 U/L (ref 0–44)
AST: 18 U/L (ref 15–41)
Albumin: 3.7 g/dL (ref 3.5–5.0)
Alkaline Phosphatase: 45 U/L (ref 38–126)
Anion gap: 9 (ref 5–15)
BUN: 18 mg/dL (ref 8–23)
CO2: 26 mmol/L (ref 22–32)
Calcium: 9 mg/dL (ref 8.9–10.3)
Chloride: 104 mmol/L (ref 98–111)
Creatinine, Ser: 1.4 mg/dL — ABNORMAL HIGH (ref 0.61–1.24)
GFR, Estimated: 50 mL/min — ABNORMAL LOW (ref >60)
Glucose, Bld: 99 mg/dL (ref 70–99)
Potassium: 3.5 mmol/L (ref 3.5–5.1)
Sodium: 139 mmol/L (ref 135–145)
Total Bilirubin: 1.1 mg/dL (ref 0.3–1.2)
Total Protein: 6.6 g/dL (ref 6.5–8.1)

## 2022-07-27 MED ORDER — DENOSUMAB 120 MG/1.7ML ~~LOC~~ SOLN
120.0000 mg | Freq: Once | SUBCUTANEOUS | Status: AC
  Administered 2022-07-27: 120 mg via SUBCUTANEOUS
  Filled 2022-07-27: qty 1.7

## 2022-07-27 NOTE — Patient Instructions (Signed)
[facility]  Discharge Instructions: Thank you for choosing [facility] to provide your oncology and hematology care.  If you have a lab appointment with the Cancer Center - please note that after April 8th, 2024, all labs will be drawn in the cancer center.  You do not have to check in or register with the main entrance as you have in the past but will complete your check-in in the cancer center.  Wear comfortable clothing and clothing appropriate for easy access to any Portacath or PICC line.   We strive to give you quality time with your provider. You may need to reschedule your appointment if you arrive late (15 or more minutes).  Arriving late affects you and other patients whose appointments are after yours.  Also, if you miss three or more appointments without notifying the office, you may be dismissed from the clinic at the provider's discretion.      For prescription refill requests, have your pharmacy contact our office and allow 72 hours for refills to be completed.    Today you received the following chemotherapy and/or immunotherapy agents Xgeva.  Denosumab Injection (Oncology) What is this medication? DENOSUMAB (den oh SUE mab) prevents weakened bones caused by cancer. It may also be used to treat noncancerous bone tumors that cannot be removed by surgery. It can also be used to treat high calcium levels in the blood caused by cancer. It works by blocking a protein that causes bones to break down quickly. This slows down the release of calcium from bones, which lowers calcium levels in your blood. It also makes your bones stronger and less likely to break (fracture). This medicine may be used for other purposes; ask your health care provider or pharmacist if you have questions. COMMON BRAND NAME(S): XGEVA What should I tell my care team before I take this medication? They need to know if you have any of these conditions: Dental disease Having  surgery or tooth extraction Infection Kidney disease Low levels of calcium or vitamin D in the blood Malnutrition On hemodialysis Skin conditions or sensitivity Thyroid or parathyroid disease An unusual reaction to denosumab, other medications, foods, dyes, or preservatives Pregnant or trying to get pregnant Breast-feeding How should I use this medication? This medication is for injection under the skin. It is given by your care team in a hospital or clinic setting. A special MedGuide will be given to you before each treatment. Be sure to read this information carefully each time. Talk to your care team about the use of this medication in children. While it may be prescribed for children as young as 13 years for selected conditions, precautions do apply. Overdosage: If you think you have taken too much of this medicine contact a poison control center or emergency room at once. NOTE: This medicine is only for you. Do not share this medicine with others. What if I miss a dose? Keep appointments for follow-up doses. It is important not to miss your dose. Call your care team if you are unable to keep an appointment. What may interact with this medication? Do not take this medication with any of the following: Other medications containing denosumab This medication may also interact with the following: Medications that lower your chance of fighting infection Steroid medications, such as prednisone or cortisone This list may not describe all possible interactions. Give your health care provider a list of all the medicines, herbs, non-prescription drugs, or dietary supplements you use.  Also tell them if you smoke, drink alcohol, or use illegal drugs. Some items may interact with your medicine. What should I watch for while using this medication? Your condition will be monitored carefully while you are receiving this medication. You may need blood work while taking this medication. This medication  may increase your risk of getting an infection. Call your care team for advice if you get a fever, chills, sore throat, or other symptoms of a cold or flu. Do not treat yourself. Try to avoid being around people who are sick. You should make sure you get enough calcium and vitamin D while you are taking this medication, unless your care team tells you not to. Discuss the foods you eat and the vitamins you take with your care team. Some people who take this medication have severe bone, joint, or muscle pain. This medication may also increase your risk for jaw problems or a broken thigh bone. Tell your care team right away if you have severe pain in your jaw, bones, joints, or muscles. Tell your care team if you have any pain that does not go away or that gets worse. Talk to your care team if you may be pregnant. Serious birth defects can occur if you take this medication during pregnancy and for 5 months after the last dose. You will need a negative pregnancy test before starting this medication. Contraception is recommended while taking this medication and for 5 months after the last dose. Your care team can help you find the option that works for you. What side effects may I notice from receiving this medication? Side effects that you should report to your care team as soon as possible: Allergic reactions--skin rash, itching, hives, swelling of the face, lips, tongue, or throat Bone, joint, or muscle pain Low calcium level--muscle pain or cramps, confusion, tingling, or numbness in the hands or feet Osteonecrosis of the jaw--pain, swelling, or redness in the mouth, numbness of the jaw, poor healing after dental work, unusual discharge from the mouth, visible bones in the mouth Side effects that usually do not require medical attention (report to your care team if they continue or are bothersome): Cough Diarrhea Fatigue Headache Nausea This list may not describe all possible side effects. Call your  doctor for medical advice about side effects. You may report side effects to FDA at 1-800-FDA-1088. Where should I keep my medication? This medication is given in a hospital or clinic. It will not be stored at home. NOTE: This sheet is a summary. It may not cover all possible information. If you have questions about this medicine, talk to your doctor, pharmacist, or health care provider.  2023 Elsevier/Gold Standard (2021-08-28 00:00:00)        To help prevent nausea and vomiting after your treatment, we encourage you to take your nausea medication as directed.  BELOW ARE SYMPTOMS THAT SHOULD BE REPORTED IMMEDIATELY: *FEVER GREATER THAN 100.4 F (38 C) OR HIGHER *CHILLS OR SWEATING *NAUSEA AND VOMITING THAT IS NOT CONTROLLED WITH YOUR NAUSEA MEDICATION *UNUSUAL SHORTNESS OF BREATH *UNUSUAL BRUISING OR BLEEDING *URINARY PROBLEMS (pain or burning when urinating, or frequent urination) *BOWEL PROBLEMS (unusual diarrhea, constipation, pain near the anus) TENDERNESS IN MOUTH AND THROAT WITH OR WITHOUT PRESENCE OF ULCERS (sore throat, sores in mouth, or a toothache) UNUSUAL RASH, SWELLING OR PAIN  UNUSUAL VAGINAL DISCHARGE OR ITCHING   Items with * indicate a potential emergency and should be followed up as soon as possible or go to the  Emergency Department if any problems should occur.  Please show the CHEMOTHERAPY ALERT CARD or IMMUNOTHERAPY ALERT CARD at check-in to the Emergency Department and triage nurse.  Should you have questions after your visit or need to cancel or reschedule your appointment, please contact [facility] [phone]  and follow the prompts.  Office hours are 8:00 a.m. to 4:30 p.m. Monday - Friday. Please note that voicemails left after 4:00 p.m. may not be returned until the following business day.  We are closed weekends and major holidays. You have access to a nurse at all times for urgent questions. Please call the main number to the clinic  [phone] and follow the prompts.  For any non-urgent questions, you may also contact your provider using MyChart. We now offer e-Visits for anyone 36 and older to request care online for non-urgent symptoms. For details visit [email or web address].   Also download the MyChart app! Go to the app store, search "MyChart", open the app, select [place], and log in with your MyChart username and password.

## 2022-07-27 NOTE — Progress Notes (Signed)
Patient tolerated Xgeva injection with no complaints voiced.  Site clean and dry with no bruising or swelling noted.  No complaints of pain.  Discharged with vital signs stable and no signs or symptoms of distress noted.  

## 2022-07-30 ENCOUNTER — Other Ambulatory Visit: Payer: Self-pay

## 2022-08-21 DIAGNOSIS — N1831 Chronic kidney disease, stage 3a: Secondary | ICD-10-CM | POA: Diagnosis not present

## 2022-08-21 DIAGNOSIS — C61 Malignant neoplasm of prostate: Secondary | ICD-10-CM | POA: Diagnosis not present

## 2022-08-21 DIAGNOSIS — N261 Atrophy of kidney (terminal): Secondary | ICD-10-CM | POA: Diagnosis not present

## 2022-08-21 DIAGNOSIS — I129 Hypertensive chronic kidney disease with stage 1 through stage 4 chronic kidney disease, or unspecified chronic kidney disease: Secondary | ICD-10-CM | POA: Diagnosis not present

## 2022-08-22 LAB — LAB REPORT - SCANNED
Albumin, Urine POC: 3.1
Albumin/Creatinine Ratio, Urine, POC: 4
Creatinine, Urine.: 82.3
EGFR: 48

## 2022-08-23 ENCOUNTER — Other Ambulatory Visit (HOSPITAL_COMMUNITY): Payer: Self-pay

## 2022-08-24 ENCOUNTER — Inpatient Hospital Stay: Payer: Medicaid Other | Attending: Hematology

## 2022-08-24 ENCOUNTER — Inpatient Hospital Stay: Payer: Medicaid Other

## 2022-08-24 VITALS — BP 134/67 | HR 59 | Temp 97.5°F | Resp 18 | Wt 170.0 lb

## 2022-08-24 DIAGNOSIS — C61 Malignant neoplasm of prostate: Secondary | ICD-10-CM

## 2022-08-24 DIAGNOSIS — G629 Polyneuropathy, unspecified: Secondary | ICD-10-CM | POA: Insufficient documentation

## 2022-08-24 DIAGNOSIS — N189 Chronic kidney disease, unspecified: Secondary | ICD-10-CM | POA: Insufficient documentation

## 2022-08-24 DIAGNOSIS — I129 Hypertensive chronic kidney disease with stage 1 through stage 4 chronic kidney disease, or unspecified chronic kidney disease: Secondary | ICD-10-CM | POA: Insufficient documentation

## 2022-08-24 DIAGNOSIS — D631 Anemia in chronic kidney disease: Secondary | ICD-10-CM | POA: Insufficient documentation

## 2022-08-24 DIAGNOSIS — C7951 Secondary malignant neoplasm of bone: Secondary | ICD-10-CM | POA: Insufficient documentation

## 2022-08-24 LAB — CBC WITH DIFFERENTIAL/PLATELET
Abs Immature Granulocytes: 0.01 10*3/uL (ref 0.00–0.07)
Basophils Absolute: 0 10*3/uL (ref 0.0–0.1)
Basophils Relative: 0 %
Eosinophils Absolute: 0.1 10*3/uL (ref 0.0–0.5)
Eosinophils Relative: 1 %
HCT: 34.1 % — ABNORMAL LOW (ref 39.0–52.0)
Hemoglobin: 11.4 g/dL — ABNORMAL LOW (ref 13.0–17.0)
Immature Granulocytes: 0 %
Lymphocytes Relative: 42 %
Lymphs Abs: 1.9 10*3/uL (ref 0.7–4.0)
MCH: 31.5 pg (ref 26.0–34.0)
MCHC: 33.4 g/dL (ref 30.0–36.0)
MCV: 94.2 fL (ref 80.0–100.0)
Monocytes Absolute: 0.5 10*3/uL (ref 0.1–1.0)
Monocytes Relative: 11 %
Neutro Abs: 2.1 10*3/uL (ref 1.7–7.7)
Neutrophils Relative %: 46 %
Platelets: 190 10*3/uL (ref 150–400)
RBC: 3.62 MIL/uL — ABNORMAL LOW (ref 4.22–5.81)
RDW: 13.1 % (ref 11.5–15.5)
WBC: 4.5 10*3/uL (ref 4.0–10.5)
nRBC: 0 % (ref 0.0–0.2)

## 2022-08-24 LAB — COMPREHENSIVE METABOLIC PANEL
ALT: 12 U/L (ref 0–44)
AST: 16 U/L (ref 15–41)
Albumin: 3.7 g/dL (ref 3.5–5.0)
Alkaline Phosphatase: 52 U/L (ref 38–126)
Anion gap: 9 (ref 5–15)
BUN: 20 mg/dL (ref 8–23)
CO2: 25 mmol/L (ref 22–32)
Calcium: 8.9 mg/dL (ref 8.9–10.3)
Chloride: 104 mmol/L (ref 98–111)
Creatinine, Ser: 1.39 mg/dL — ABNORMAL HIGH (ref 0.61–1.24)
GFR, Estimated: 51 mL/min — ABNORMAL LOW (ref >60)
Glucose, Bld: 87 mg/dL (ref 70–99)
Potassium: 3.6 mmol/L (ref 3.5–5.1)
Sodium: 138 mmol/L (ref 135–145)
Total Bilirubin: 0.9 mg/dL (ref 0.3–1.2)
Total Protein: 6.6 g/dL (ref 6.5–8.1)

## 2022-08-24 LAB — PSA: Prostatic Specific Antigen: 0.17 ng/mL (ref 0.00–4.00)

## 2022-08-24 MED ORDER — DENOSUMAB 120 MG/1.7ML ~~LOC~~ SOLN
120.0000 mg | Freq: Once | SUBCUTANEOUS | Status: AC
  Administered 2022-08-24: 120 mg via SUBCUTANEOUS
  Filled 2022-08-24: qty 1.7

## 2022-08-24 NOTE — Progress Notes (Signed)
Patient taking calcium as directed. Denied tooth, jaw, and leg pain. No recent or upcoming dental visits. Labs reviewed. Patient tolerated injection with no complaints voiced. See MAR for details. Patient stable during and after injection. Site clean and dry with no bruising or swelling noted. Band aid applied. Vss with discharge and left in satisfactory condition with no s/s of distress.   

## 2022-08-24 NOTE — Progress Notes (Signed)
Chaplain engaged in an initial visit with Carlos Harper. Chaplain engaged in conversation and offered support.     08/24/22 1100  Spiritual Encounters  Type of Visit Initial  Care provided to: Patient

## 2022-08-24 NOTE — Patient Instructions (Signed)
[facility]  Discharge Instructions: Thank you for choosing [facility] to provide your oncology and hematology care.  If you have a lab appointment with the Cancer Center - please note that after April 8th, 2024, all labs will be drawn in the cancer center.  You do not have to check in or register with the main entrance as you have in the past but will complete your check-in in the cancer center.  Wear comfortable clothing and clothing appropriate for easy access to any Portacath or PICC line.   We strive to give you quality time with your provider. You may need to reschedule your appointment if you arrive late (15 or more minutes).  Arriving late affects you and other patients whose appointments are after yours.  Also, if you miss three or more appointments without notifying the office, you may be dismissed from the clinic at the provider's discretion.      For prescription refill requests, have your pharmacy contact our office and allow 72 hours for refills to be completed.    Today you received the following xgeva, return as scheduled.   To help prevent nausea and vomiting after your treatment, we encourage you to take your nausea medication as directed.  BELOW ARE SYMPTOMS THAT SHOULD BE REPORTED IMMEDIATELY: *FEVER GREATER THAN 100.4 F (38 C) OR HIGHER *CHILLS OR SWEATING *NAUSEA AND VOMITING THAT IS NOT CONTROLLED WITH YOUR NAUSEA MEDICATION *UNUSUAL SHORTNESS OF BREATH *UNUSUAL BRUISING OR BLEEDING *URINARY PROBLEMS (pain or burning when urinating, or frequent urination) *BOWEL PROBLEMS (unusual diarrhea, constipation, pain near the anus) TENDERNESS IN MOUTH AND THROAT WITH OR WITHOUT PRESENCE OF ULCERS (sore throat, sores in mouth, or a toothache) UNUSUAL RASH, SWELLING OR PAIN  UNUSUAL VAGINAL DISCHARGE OR ITCHING   Items with * indicate a potential emergency and should be followed up as soon as possible or go to the Emergency Department if any  problems should occur.  Please show the CHEMOTHERAPY ALERT CARD or IMMUNOTHERAPY ALERT CARD at check-in to the Emergency Department and triage nurse.  Should you have questions after your visit or need to cancel or reschedule your appointment, please contact [facility] [phone]  and follow the prompts.  Office hours are 8:00 a.m. to 4:30 p.m. Monday - Friday. Please note that voicemails left after 4:00 p.m. may not be returned until the following business day.  We are closed weekends and major holidays. You have access to a nurse at all times for urgent questions. Please call the main number to the clinic [phone] and follow the prompts.  For any non-urgent questions, you may also contact your provider using MyChart. We now offer e-Visits for anyone 72 and older to request care online for non-urgent symptoms. For details visit [email or web address].   Also download the MyChart app! Go to the app store, search "MyChart", open the app, select [place], and log in with your MyChart username and password.
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
## 2022-08-27 ENCOUNTER — Inpatient Hospital Stay (HOSPITAL_BASED_OUTPATIENT_CLINIC_OR_DEPARTMENT_OTHER): Payer: Medicaid Other | Admitting: Hematology

## 2022-08-27 ENCOUNTER — Other Ambulatory Visit: Payer: Self-pay | Admitting: *Deleted

## 2022-08-27 ENCOUNTER — Other Ambulatory Visit: Payer: Self-pay | Admitting: Hematology

## 2022-08-27 ENCOUNTER — Other Ambulatory Visit: Payer: Self-pay

## 2022-08-27 ENCOUNTER — Other Ambulatory Visit (HOSPITAL_COMMUNITY): Payer: Self-pay

## 2022-08-27 VITALS — BP 132/74 | HR 62 | Temp 97.4°F | Resp 18 | Wt 168.9 lb

## 2022-08-27 DIAGNOSIS — C61 Malignant neoplasm of prostate: Secondary | ICD-10-CM

## 2022-08-27 DIAGNOSIS — D500 Iron deficiency anemia secondary to blood loss (chronic): Secondary | ICD-10-CM | POA: Diagnosis not present

## 2022-08-27 DIAGNOSIS — E538 Deficiency of other specified B group vitamins: Secondary | ICD-10-CM | POA: Diagnosis not present

## 2022-08-27 MED ORDER — MECLIZINE HCL 25 MG PO TABS: 25.0000 mg | ORAL_TABLET | Freq: Three times a day (TID) | ORAL | 1 refills | Status: DC | PRN

## 2022-08-27 MED ORDER — ABIRATERONE ACETATE 250 MG PO TABS
ORAL_TABLET | Freq: Every day | ORAL | 3 refills | Status: DC
Start: 2022-08-27 — End: 2022-12-14
  Filled 2022-08-27: qty 120, 30d supply, fill #0
  Filled 2022-09-20: qty 120, 30d supply, fill #1
  Filled 2022-10-19: qty 120, 30d supply, fill #2
  Filled 2022-11-12: qty 120, 30d supply, fill #3

## 2022-08-27 NOTE — Telephone Encounter (Signed)
Zytiga refill approved.  Patient is tolerating and is to continue therapy. 

## 2022-08-27 NOTE — Patient Instructions (Signed)
[facility] at [facility] Discharge Instructions   You were seen and examined today by Dr. Ellin Saba.  He reviewed the results of your lab work which are normal/stable.   We will see you back in 3 months. We will repeat your lab work prior to your next visit.    Thank you for choosing [facility] at [facility] to provide your oncology and hematology care.  To afford each patient quality time with our provider, please arrive at least 15 minutes before your scheduled appointment time.   If you have a lab appointment with the Cancer Center please come in thru the Main Entrance and check in at the main information desk.  You need to re-schedule your appointment should you arrive 10 or more minutes late.  We strive to give you quality time with our providers, and arriving late affects you and other patients whose appointments are after yours.  Also, if you no show three or more times for appointments you may be dismissed from the clinic at the providers discretion.     Again, thank you for choosing [facility].  Our hope is that these requests will decrease the amount of time that you wait before being seen by our physicians.       _____________________________________________________________  Should you have questions after your visit to [facility], please contact our office at [phone] and follow the prompts.  Our office hours are 8:00 a.m. and 4:30 p.m. Monday - Friday.  Please note that voicemails left after 4:00 p.m. may not be returned until the following business day.  We are closed weekends and major holidays.  You do have access to a nurse 24-7, just call the main number to the clinic [phone] and do not press any options, hold on the line and a nurse will answer the phone.    For prescription refill requests, have your pharmacy contact our office and allow 72 hours.    Due to Covid, you will need to wear a  mask upon entering the hospital. If you do not have a mask, a mask will be given to you at the Main Entrance upon arrival. For doctor visits, patients may have 1 support person age 51 or older with them. For treatment visits, patients can not have anyone with them due to social distancing guidelines and our immunocompromised population.

## 2022-08-28 ENCOUNTER — Other Ambulatory Visit: Payer: Self-pay

## 2022-09-06 ENCOUNTER — Other Ambulatory Visit: Payer: Self-pay | Admitting: Hematology

## 2022-09-06 ENCOUNTER — Other Ambulatory Visit (HOSPITAL_COMMUNITY): Payer: Self-pay

## 2022-09-20 ENCOUNTER — Other Ambulatory Visit (HOSPITAL_COMMUNITY): Payer: Self-pay

## 2022-09-21 ENCOUNTER — Inpatient Hospital Stay: Payer: Medicaid Other

## 2022-09-21 ENCOUNTER — Other Ambulatory Visit: Payer: Self-pay

## 2022-09-21 VITALS — BP 145/87 | HR 65 | Temp 96.5°F | Resp 18

## 2022-09-21 DIAGNOSIS — C61 Malignant neoplasm of prostate: Secondary | ICD-10-CM | POA: Diagnosis not present

## 2022-09-21 LAB — COMPREHENSIVE METABOLIC PANEL
ALT: 16 U/L (ref 0–44)
AST: 19 U/L (ref 15–41)
Albumin: 4.1 g/dL (ref 3.5–5.0)
Alkaline Phosphatase: 52 U/L (ref 38–126)
Anion gap: 10 (ref 5–15)
BUN: 18 mg/dL (ref 8–23)
CO2: 24 mmol/L (ref 22–32)
Calcium: 9 mg/dL (ref 8.9–10.3)
Chloride: 103 mmol/L (ref 98–111)
Creatinine, Ser: 1.43 mg/dL — ABNORMAL HIGH (ref 0.61–1.24)
GFR, Estimated: 49 mL/min — ABNORMAL LOW (ref >60)
Glucose, Bld: 88 mg/dL (ref 70–99)
Potassium: 3.7 mmol/L (ref 3.5–5.1)
Sodium: 137 mmol/L (ref 135–145)
Total Bilirubin: 1.4 mg/dL — ABNORMAL HIGH (ref 0.3–1.2)
Total Protein: 7.6 g/dL (ref 6.5–8.1)

## 2022-09-21 MED ORDER — DENOSUMAB 120 MG/1.7ML ~~LOC~~ SOLN
120.0000 mg | Freq: Once | SUBCUTANEOUS | Status: AC
  Administered 2022-09-21: 120 mg via SUBCUTANEOUS
  Filled 2022-09-21: qty 1.7

## 2022-09-21 NOTE — Patient Instructions (Signed)
[facility]  Discharge Instructions: Thank you for choosing [facility] to provide your oncology and hematology care.  If you have a lab appointment with the Cancer Center - please note that after April 8th, 2024, all labs will be drawn in the cancer center.  You do not have to check in or register with the main entrance as you have in the past but will complete your check-in in the cancer center.  Wear comfortable clothing and clothing appropriate for easy access to any Portacath or PICC line.   We strive to give you quality time with your provider. You may need to reschedule your appointment if you arrive late (15 or more minutes).  Arriving late affects you and other patients whose appointments are after yours.  Also, if you miss three or more appointments without notifying the office, you may be dismissed from the clinic at the provider's discretion.      For prescription refill requests, have your pharmacy contact our office and allow 72 hours for refills to be completed.    Today you received the following chemotherapy and/or immunotherapy agents Xgeva injection. Denosumab Injection (Oncology) What is this medication? DENOSUMAB (den oh SUE mab) prevents weakened bones caused by cancer. It may also be used to treat noncancerous bone tumors that cannot be removed by surgery. It can also be used to treat high calcium levels in the blood caused by cancer. It works by blocking a protein that causes bones to break down quickly. This slows down the release of calcium from bones, which lowers calcium levels in your blood. It also makes your bones stronger and less likely to break (fracture). This medicine may be used for other purposes; ask your health care provider or pharmacist if you have questions. COMMON BRAND NAME(S): XGEVA What should I tell my care team before I take this medication? They need to know if you have any of these conditions: Dental  disease Having surgery or tooth extraction Infection Kidney disease Low levels of calcium or vitamin D in the blood Malnutrition On hemodialysis Skin conditions or sensitivity Thyroid or parathyroid disease An unusual reaction to denosumab, other medications, foods, dyes, or preservatives Pregnant or trying to get pregnant Breast-feeding How should I use this medication? This medication is for injection under the skin. It is given by your care team in a hospital or clinic setting. A special MedGuide will be given to you before each treatment. Be sure to read this information carefully each time. Talk to your care team about the use of this medication in children. While it may be prescribed for children as young as 13 years for selected conditions, precautions do apply. Overdosage: If you think you have taken too much of this medicine contact a poison control center or emergency room at once. NOTE: This medicine is only for you. Do not share this medicine with others. What if I miss a dose? Keep appointments for follow-up doses. It is important not to miss your dose. Call your care team if you are unable to keep an appointment. What may interact with this medication? Do not take this medication with any of the following: Other medications containing denosumab This medication may also interact with the following: Medications that lower your chance of fighting infection Steroid medications, such as prednisone or cortisone This list may not describe all possible interactions. Give your health care provider a list of all the medicines, herbs, non-prescription drugs, or dietary supplements you use.  Also tell them if you smoke, drink alcohol, or use illegal drugs. Some items may interact with your medicine. What should I watch for while using this medication? Your condition will be monitored carefully while you are receiving this medication. You may need blood work while taking this  medication. This medication may increase your risk of getting an infection. Call your care team for advice if you get a fever, chills, sore throat, or other symptoms of a cold or flu. Do not treat yourself. Try to avoid being around people who are sick. You should make sure you get enough calcium and vitamin D while you are taking this medication, unless your care team tells you not to. Discuss the foods you eat and the vitamins you take with your care team. Some people who take this medication have severe bone, joint, or muscle pain. This medication may also increase your risk for jaw problems or a broken thigh bone. Tell your care team right away if you have severe pain in your jaw, bones, joints, or muscles. Tell your care team if you have any pain that does not go away or that gets worse. Talk to your care team if you may be pregnant. Serious birth defects can occur if you take this medication during pregnancy and for 5 months after the last dose. You will need a negative pregnancy test before starting this medication. Contraception is recommended while taking this medication and for 5 months after the last dose. Your care team can help you find the option that works for you. What side effects may I notice from receiving this medication? Side effects that you should report to your care team as soon as possible: Allergic reactions--skin rash, itching, hives, swelling of the face, lips, tongue, or throat Bone, joint, or muscle pain Low calcium level--muscle pain or cramps, confusion, tingling, or numbness in the hands or feet Osteonecrosis of the jaw--pain, swelling, or redness in the mouth, numbness of the jaw, poor healing after dental work, unusual discharge from the mouth, visible bones in the mouth Side effects that usually do not require medical attention (report to your care team if they continue or are bothersome): Cough Diarrhea Fatigue Headache Nausea This list may not describe all  possible side effects. Call your doctor for medical advice about side effects. You may report side effects to FDA at 1-800-FDA-1088. Where should I keep my medication? This medication is given in a hospital or clinic. It will not be stored at home. NOTE: This sheet is a summary. It may not cover all possible information. If you have questions about this medicine, talk to your doctor, pharmacist, or health care provider.  2024 Elsevier/Gold Standard (2021-08-30 00:00:00)       To help prevent nausea and vomiting after your treatment, we encourage you to take your nausea medication as directed.  BELOW ARE SYMPTOMS THAT SHOULD BE REPORTED IMMEDIATELY: *FEVER GREATER THAN 100.4 F (38 C) OR HIGHER *CHILLS OR SWEATING *NAUSEA AND VOMITING THAT IS NOT CONTROLLED WITH YOUR NAUSEA MEDICATION *UNUSUAL SHORTNESS OF BREATH *UNUSUAL BRUISING OR BLEEDING *URINARY PROBLEMS (pain or burning when urinating, or frequent urination) *BOWEL PROBLEMS (unusual diarrhea, constipation, pain near the anus) TENDERNESS IN MOUTH AND THROAT WITH OR WITHOUT PRESENCE OF ULCERS (sore throat, sores in mouth, or a toothache) UNUSUAL RASH, SWELLING OR PAIN  UNUSUAL VAGINAL DISCHARGE OR ITCHING   Items with * indicate a potential emergency and should be followed up as soon as possible or go to the Emergency  Department if any problems should occur.  Please show the CHEMOTHERAPY ALERT CARD or IMMUNOTHERAPY ALERT CARD at check-in to the Emergency Department and triage nurse.  Should you have questions after your visit or need to cancel or reschedule your appointment, please contact [facility] [phone]  and follow the prompts.  Office hours are 8:00 a.m. to 4:30 p.m. Monday - Friday. Please note that voicemails left after 4:00 p.m. may not be returned until the following business day.  We are closed weekends and major holidays. You have access to a nurse at all times for urgent questions. Please call  the main number to the clinic [phone] and follow the prompts.  For any non-urgent questions, you may also contact your provider using MyChart. We now offer e-Visits for anyone 47 and older to request care online for non-urgent symptoms. For details visit [email or web address].   Also download the MyChart app! Go to the app store, search "MyChart", open the app, select [place], and log in with your MyChart username and password.

## 2022-09-21 NOTE — Progress Notes (Signed)
Carlos Harper presents today for injection per the provider's orders. Calcium level 9.0. Creatinine 1.43.     XGeva administration without incident; injection site WNL; see MAR for injection details.  Patient tolerated procedure well and without incident.  No questions or complaints noted at this time. Treatment given today per MD orders. Discharged from clinic ambulatory in stable condition. Alert and oriented x 3. F/U with [facility] as scheduled.
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
## 2022-09-26 ENCOUNTER — Other Ambulatory Visit: Payer: Self-pay

## 2022-09-26 ENCOUNTER — Ambulatory Visit (HOSPITAL_COMMUNITY): Payer: Medicaid Other | Attending: Hematology

## 2022-09-26 DIAGNOSIS — R42 Dizziness and giddiness: Secondary | ICD-10-CM | POA: Diagnosis present

## 2022-10-17 ENCOUNTER — Other Ambulatory Visit (HOSPITAL_COMMUNITY): Payer: Self-pay

## 2022-10-19 ENCOUNTER — Inpatient Hospital Stay: Payer: Medicaid Other | Attending: Hematology

## 2022-10-19 ENCOUNTER — Inpatient Hospital Stay: Payer: Medicaid Other

## 2022-10-19 ENCOUNTER — Other Ambulatory Visit (HOSPITAL_COMMUNITY): Payer: Self-pay

## 2022-10-19 VITALS — BP 143/76 | HR 66 | Temp 97.8°F | Resp 18

## 2022-10-19 DIAGNOSIS — C779 Secondary and unspecified malignant neoplasm of lymph node, unspecified: Secondary | ICD-10-CM | POA: Insufficient documentation

## 2022-10-19 DIAGNOSIS — C61 Malignant neoplasm of prostate: Secondary | ICD-10-CM | POA: Insufficient documentation

## 2022-10-19 DIAGNOSIS — C7951 Secondary malignant neoplasm of bone: Secondary | ICD-10-CM | POA: Diagnosis present

## 2022-10-19 LAB — COMPREHENSIVE METABOLIC PANEL
ALT: 13 U/L (ref 0–44)
AST: 16 U/L (ref 15–41)
Albumin: 3.7 g/dL (ref 3.5–5.0)
Alkaline Phosphatase: 49 U/L (ref 38–126)
Anion gap: 9 (ref 5–15)
BUN: 24 mg/dL — ABNORMAL HIGH (ref 8–23)
CO2: 24 mmol/L (ref 22–32)
Calcium: 8.9 mg/dL (ref 8.9–10.3)
Chloride: 104 mmol/L (ref 98–111)
Creatinine, Ser: 1.49 mg/dL — ABNORMAL HIGH (ref 0.61–1.24)
GFR, Estimated: 47 mL/min — ABNORMAL LOW (ref >60)
Glucose, Bld: 93 mg/dL (ref 70–99)
Potassium: 3.8 mmol/L (ref 3.5–5.1)
Sodium: 137 mmol/L (ref 135–145)
Total Bilirubin: 1.1 mg/dL (ref 0.3–1.2)
Total Protein: 7 g/dL (ref 6.5–8.1)

## 2022-10-19 MED ORDER — DENOSUMAB 120 MG/1.7ML ~~LOC~~ SOLN
120.0000 mg | Freq: Once | SUBCUTANEOUS | Status: AC
  Administered 2022-10-19: 120 mg via SUBCUTANEOUS
  Filled 2022-10-19: qty 1.7

## 2022-10-19 NOTE — Progress Notes (Signed)
Xgeva injection  given per orders. Patient tolerated it well without problems. Vitals stable and discharged home from clinic ambulatory. Follow up as scheduled.   Patient verified his is taking his calcium and vit. D. No teeth or jaw pain reported.

## 2022-10-22 ENCOUNTER — Other Ambulatory Visit: Payer: Self-pay | Admitting: Family Medicine

## 2022-10-22 DIAGNOSIS — C61 Malignant neoplasm of prostate: Secondary | ICD-10-CM

## 2022-10-22 NOTE — Telephone Encounter (Signed)
Prescription Request  10/22/2022  LOV: 07/09/2022  What is the name of the medication or equipment?   predniSONE (DELTASONE) 5 MG tablet [[id number]]   abiraterone acetate (ZYTIGA) 250 MG tablet [[id number]]   gabapentin (NEURONTIN) 300 MG capsule [[id number]]   amLODipine (NORVASC) 5 MG tablet [[id number]]  **[facility]ing [facility] [address] in [place] to be default pharmacy permanently. Having difficulty filling script on time at the other pharmacy**  Have you contacted your pharmacy to request a refill? Yes   Which pharmacy would you like this sent to?  [id number] - [place], [place] - [address] AT [facility] [address] [place] [address] Phone: [phone] Fax: [phone]    Patient notified that their request is being sent to the clinical staff for review and that they should receive a response within 2 business days.   Please advise at [phone].

## 2022-10-23 MED ORDER — AMLODIPINE BESYLATE 5 MG PO TABS: 5.0000 mg | ORAL_TABLET | Freq: Every day | ORAL | 0 refills | Status: DC

## 2022-10-23 MED ORDER — GABAPENTIN 300 MG PO CAPS: ORAL_CAPSULE | ORAL | 1 refills | Status: DC

## 2022-10-24 ENCOUNTER — Ambulatory Visit (HOSPITAL_COMMUNITY): Payer: Medicaid Other | Attending: Hematology

## 2022-10-24 DIAGNOSIS — R42 Dizziness and giddiness: Secondary | ICD-10-CM | POA: Diagnosis present
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
## 2022-11-07 DIAGNOSIS — H903 Sensorineural hearing loss, bilateral: Secondary | ICD-10-CM | POA: Insufficient documentation

## 2022-11-07 DIAGNOSIS — R42 Dizziness and giddiness: Secondary | ICD-10-CM | POA: Insufficient documentation

## 2022-11-08 ENCOUNTER — Other Ambulatory Visit: Payer: Self-pay

## 2022-11-12 ENCOUNTER — Other Ambulatory Visit: Payer: Self-pay

## 2022-11-14 ENCOUNTER — Other Ambulatory Visit (HOSPITAL_COMMUNITY): Payer: Self-pay

## 2022-11-14 ENCOUNTER — Telehealth (HOSPITAL_COMMUNITY): Payer: Self-pay | Admitting: Physical Therapy

## 2022-11-14 ENCOUNTER — Ambulatory Visit (HOSPITAL_COMMUNITY): Payer: Medicaid Other | Admitting: Physical Therapy

## 2022-11-14 NOTE — Telephone Encounter (Signed)
First no show  Called pt, cell phone is actually his daughter.  Daughter states that there has been a death in the family.  Daughter states that she will contact her father and have him call the clinic.  Virgina Organ, PT CLT [phone]

## 2022-11-16 ENCOUNTER — Inpatient Hospital Stay: Payer: Medicaid Other | Attending: Hematology

## 2022-11-16 DIAGNOSIS — D631 Anemia in chronic kidney disease: Secondary | ICD-10-CM | POA: Insufficient documentation

## 2022-11-16 DIAGNOSIS — C61 Malignant neoplasm of prostate: Secondary | ICD-10-CM | POA: Insufficient documentation

## 2022-11-16 DIAGNOSIS — G629 Polyneuropathy, unspecified: Secondary | ICD-10-CM | POA: Insufficient documentation

## 2022-11-16 DIAGNOSIS — C7951 Secondary malignant neoplasm of bone: Secondary | ICD-10-CM | POA: Insufficient documentation

## 2022-11-16 DIAGNOSIS — N189 Chronic kidney disease, unspecified: Secondary | ICD-10-CM | POA: Insufficient documentation

## 2022-11-16 DIAGNOSIS — I129 Hypertensive chronic kidney disease with stage 1 through stage 4 chronic kidney disease, or unspecified chronic kidney disease: Secondary | ICD-10-CM | POA: Insufficient documentation
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
## 2022-11-19 ENCOUNTER — Inpatient Hospital Stay: Payer: Medicaid Other

## 2022-11-19 ENCOUNTER — Inpatient Hospital Stay: Payer: Medicaid Other | Admitting: Hematology

## 2022-11-19 ENCOUNTER — Inpatient Hospital Stay: Payer: Medicaid Other | Attending: Hematology

## 2022-11-19 DIAGNOSIS — D631 Anemia in chronic kidney disease: Secondary | ICD-10-CM | POA: Diagnosis not present

## 2022-11-19 DIAGNOSIS — N189 Chronic kidney disease, unspecified: Secondary | ICD-10-CM | POA: Diagnosis not present

## 2022-11-19 DIAGNOSIS — I129 Hypertensive chronic kidney disease with stage 1 through stage 4 chronic kidney disease, or unspecified chronic kidney disease: Secondary | ICD-10-CM | POA: Diagnosis not present

## 2022-11-19 DIAGNOSIS — C61 Malignant neoplasm of prostate: Secondary | ICD-10-CM

## 2022-11-19 DIAGNOSIS — C7951 Secondary malignant neoplasm of bone: Secondary | ICD-10-CM | POA: Diagnosis present

## 2022-11-19 DIAGNOSIS — E538 Deficiency of other specified B group vitamins: Secondary | ICD-10-CM

## 2022-11-19 DIAGNOSIS — G629 Polyneuropathy, unspecified: Secondary | ICD-10-CM | POA: Diagnosis not present

## 2022-11-19 DIAGNOSIS — D500 Iron deficiency anemia secondary to blood loss (chronic): Secondary | ICD-10-CM

## 2022-11-19 LAB — CBC WITH DIFFERENTIAL/PLATELET
Abs Immature Granulocytes: 0.01 10*3/uL (ref 0.00–0.07)
Basophils Absolute: 0 10*3/uL (ref 0.0–0.1)
Basophils Relative: 0 %
Eosinophils Absolute: 0 10*3/uL (ref 0.0–0.5)
Eosinophils Relative: 1 %
HCT: 34.6 % — ABNORMAL LOW (ref 39.0–52.0)
Hemoglobin: 11.7 g/dL — ABNORMAL LOW (ref 13.0–17.0)
Immature Granulocytes: 0 %
Lymphocytes Relative: 49 %
Lymphs Abs: 2.5 10*3/uL (ref 0.7–4.0)
MCH: 31.5 pg (ref 26.0–34.0)
MCHC: 33.8 g/dL (ref 30.0–36.0)
MCV: 93.3 fL (ref 80.0–100.0)
Monocytes Absolute: 0.4 10*3/uL (ref 0.1–1.0)
Monocytes Relative: 9 %
Neutro Abs: 2.1 10*3/uL (ref 1.7–7.7)
Neutrophils Relative %: 41 %
Platelets: 218 10*3/uL (ref 150–400)
RBC: 3.71 MIL/uL — ABNORMAL LOW (ref 4.22–5.81)
RDW: 13.5 % (ref 11.5–15.5)
WBC: 5.1 10*3/uL (ref 4.0–10.5)
nRBC: 0 % (ref 0.0–0.2)

## 2022-11-19 LAB — COMPREHENSIVE METABOLIC PANEL
ALT: 13 U/L (ref 0–44)
AST: 17 U/L (ref 15–41)
Albumin: 3.8 g/dL (ref 3.5–5.0)
Alkaline Phosphatase: 46 U/L (ref 38–126)
Anion gap: 5 (ref 5–15)
BUN: 20 mg/dL (ref 8–23)
CO2: 25 mmol/L (ref 22–32)
Calcium: 9 mg/dL (ref 8.9–10.3)
Chloride: 107 mmol/L (ref 98–111)
Creatinine, Ser: 1.42 mg/dL — ABNORMAL HIGH (ref 0.61–1.24)
GFR, Estimated: 49 mL/min — ABNORMAL LOW (ref >60)
Glucose, Bld: 83 mg/dL (ref 70–99)
Potassium: 3.3 mmol/L — ABNORMAL LOW (ref 3.5–5.1)
Sodium: 137 mmol/L (ref 135–145)
Total Bilirubin: 1.3 mg/dL — ABNORMAL HIGH (ref 0.3–1.2)
Total Protein: 7 g/dL (ref 6.5–8.1)

## 2022-11-19 LAB — IRON AND TIBC
Iron: 85 ug/dL (ref 45–182)
Saturation Ratios: 28 % (ref 17.9–39.5)
TIBC: 309 ug/dL (ref 250–450)
UIBC: 224 ug/dL

## 2022-11-19 LAB — FERRITIN: Ferritin: 242 ng/mL (ref 24–336)

## 2022-11-19 LAB — PSA: Prostatic Specific Antigen: 0.17 ng/mL (ref 0.00–4.00)

## 2022-11-19 LAB — VITAMIN B12: Vitamin B-12: 851 pg/mL (ref 180–914)
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
## 2022-11-20 ENCOUNTER — Inpatient Hospital Stay (HOSPITAL_BASED_OUTPATIENT_CLINIC_OR_DEPARTMENT_OTHER): Payer: Medicaid Other | Admitting: Hematology

## 2022-11-20 ENCOUNTER — Inpatient Hospital Stay: Payer: Medicaid Other

## 2022-11-20 VITALS — BP 119/69 | HR 74 | Temp 98.4°F | Resp 16 | Wt 170.5 lb

## 2022-11-20 DIAGNOSIS — C61 Malignant neoplasm of prostate: Secondary | ICD-10-CM | POA: Diagnosis not present

## 2022-11-20 MED ORDER — DENOSUMAB 120 MG/1.7ML ~~LOC~~ SOLN
120.0000 mg | Freq: Once | SUBCUTANEOUS | Status: AC
  Administered 2022-11-20: 120 mg via SUBCUTANEOUS
  Filled 2022-11-20: qty 1.7

## 2022-11-20 NOTE — Progress Notes (Signed)
Xgeva injection given per orders. Patient tolerated it well without problems. Vitals stable and discharged home from clinic ambulatory. Follow up as scheduled.  

## 2022-11-20 NOTE — Progress Notes (Signed)
Patient has been assessed, vital signs and labs have been reviewed by Dr. Katragadda. ANC, Creatinine, LFTs, and Platelets are within treatment parameters per Dr. Katragadda. The patient is good to proceed with treatment at this time. Primary RN and pharmacy aware.  

## 2022-11-20 NOTE — Patient Instructions (Addendum)
[facility]  Discharge Instructions  You were seen and examined today by Dr. Ellin Saba.  Dr. Ellin Saba discussed your most recent lab work which is stable.  Continue Zytiga and Prednisone as prescribed.  Follow-up as scheduled.  Thank you for choosing [name] to provide your oncology and hematology care.   To afford each patient quality time with our provider, please arrive at least 15 minutes before your scheduled appointment time. You may need to reschedule your appointment if you arrive late (10 or more minutes). Arriving late affects you and other patients whose appointments are after yours.  Also, if you miss three or more appointments without notifying the office, you may be dismissed from the clinic at the provider's discretion.    Again, thank you for choosing [facility].  Our hope is that these requests will decrease the amount of time that you wait before being seen by our physicians.   If you have a lab appointment with the Cancer Center - please note that after April 8th, all labs will be drawn in the cancer center.  You do not have to check in or register with the main entrance as you have in the past but will complete your check-in at the cancer center.            _____________________________________________________________  Should you have questions after your visit to [facility], please contact our office at [phone] and follow the prompts.  Our office hours are 8:00 a.m. to 4:30 p.m. Monday - Thursday and 8:00 a.m. to 2:30 p.m. Friday.  Please note that voicemails left after 4:00 p.m. may not be returned until the following business day.  We are closed weekends and all major holidays.  You do have access to a nurse 24-7, just call the main number to the clinic [phone] and do not press any options, hold on the line and a nurse will answer the phone.    For prescription refill  requests, have your pharmacy contact our office and allow 72 hours.    Masks are no longer required in the cancer centers. If you would like for your care team to wear a mask while they are taking care of you, please let them know. You may have one support person who is at least 83 years old accompany you for your appointments.

## 2022-11-21 ENCOUNTER — Encounter (HOSPITAL_COMMUNITY): Payer: Medicaid Other

## 2022-12-11 ENCOUNTER — Other Ambulatory Visit (HOSPITAL_COMMUNITY): Payer: Self-pay

## 2022-12-14 ENCOUNTER — Other Ambulatory Visit: Payer: Self-pay | Admitting: *Deleted

## 2022-12-14 ENCOUNTER — Other Ambulatory Visit: Payer: Self-pay

## 2022-12-14 ENCOUNTER — Other Ambulatory Visit (HOSPITAL_COMMUNITY): Payer: Self-pay

## 2022-12-14 ENCOUNTER — Other Ambulatory Visit: Payer: Self-pay | Admitting: Hematology

## 2022-12-14 DIAGNOSIS — C61 Malignant neoplasm of prostate: Secondary | ICD-10-CM

## 2022-12-14 MED ORDER — ABIRATERONE ACETATE 250 MG PO TABS
ORAL_TABLET | Freq: Every day | ORAL | 3 refills | Status: DC
Start: 2022-12-14 — End: 2023-04-23
  Filled 2022-12-14 – 2023-01-07 (×2): qty 120, 30d supply, fill #0
  Filled 2023-01-25: qty 120, 30d supply, fill #1
  Filled 2023-02-25: qty 120, 30d supply, fill #2
  Filled 2023-03-26: qty 120, 30d supply, fill #3

## 2022-12-14 NOTE — Telephone Encounter (Signed)
Zytiga refill approved.  Patient is tolerating and is to continue therapy. 

## 2022-12-18 ENCOUNTER — Other Ambulatory Visit: Payer: Self-pay

## 2022-12-18 ENCOUNTER — Encounter: Payer: Self-pay | Admitting: Pharmacist

## 2022-12-19 ENCOUNTER — Inpatient Hospital Stay: Payer: Medicaid Other

## 2022-12-19 ENCOUNTER — Inpatient Hospital Stay: Payer: Medicaid Other | Attending: Hematology

## 2022-12-19 VITALS — BP 135/73 | HR 77 | Temp 97.8°F | Resp 16

## 2022-12-19 DIAGNOSIS — C61 Malignant neoplasm of prostate: Secondary | ICD-10-CM

## 2022-12-19 DIAGNOSIS — C7951 Secondary malignant neoplasm of bone: Secondary | ICD-10-CM | POA: Insufficient documentation

## 2022-12-19 LAB — COMPREHENSIVE METABOLIC PANEL
ALT: 15 U/L (ref 0–44)
AST: 20 U/L (ref 15–41)
Albumin: 3.7 g/dL (ref 3.5–5.0)
Alkaline Phosphatase: 47 U/L (ref 38–126)
Anion gap: 7 (ref 5–15)
BUN: 22 mg/dL (ref 8–23)
CO2: 27 mmol/L (ref 22–32)
Calcium: 9.3 mg/dL (ref 8.9–10.3)
Chloride: 104 mmol/L (ref 98–111)
Creatinine, Ser: 1.43 mg/dL — ABNORMAL HIGH (ref 0.61–1.24)
GFR, Estimated: 49 mL/min — ABNORMAL LOW (ref >60)
Glucose, Bld: 96 mg/dL (ref 70–99)
Potassium: 3.4 mmol/L — ABNORMAL LOW (ref 3.5–5.1)
Sodium: 138 mmol/L (ref 135–145)
Total Bilirubin: 1.1 mg/dL (ref 0.3–1.2)
Total Protein: 6.9 g/dL (ref 6.5–8.1)

## 2022-12-19 MED ORDER — DENOSUMAB 120 MG/1.7ML ~~LOC~~ SOLN
120.0000 mg | Freq: Once | SUBCUTANEOUS | Status: AC
  Administered 2022-12-19: 120 mg via SUBCUTANEOUS
  Filled 2022-12-19: qty 1.7

## 2022-12-19 NOTE — Progress Notes (Signed)
Patient tolerated injection with no complaints voiced.  Site clean and dry with no bruising or swelling noted at site.  See MAR for details.  Band aid applied.  Patient stable during and after injection.  Vss with discharge and left in satisfactory condition with no s/s of distress noted.  

## 2022-12-19 NOTE — Patient Instructions (Signed)

## 2022-12-20 ENCOUNTER — Other Ambulatory Visit: Payer: Self-pay

## 2022-12-25 ENCOUNTER — Other Ambulatory Visit: Payer: Self-pay

## 2022-12-25 ENCOUNTER — Other Ambulatory Visit (HOSPITAL_COMMUNITY): Payer: Self-pay

## 2022-12-26 ENCOUNTER — Other Ambulatory Visit (HOSPITAL_COMMUNITY): Payer: Self-pay

## 2022-12-28 DIAGNOSIS — H903 Sensorineural hearing loss, bilateral: Secondary | ICD-10-CM | POA: Diagnosis not present

## 2022-12-28 DIAGNOSIS — R42 Dizziness and giddiness: Secondary | ICD-10-CM | POA: Diagnosis not present

## 2023-01-03 ENCOUNTER — Other Ambulatory Visit (HOSPITAL_COMMUNITY): Payer: Self-pay

## 2023-01-05 ENCOUNTER — Other Ambulatory Visit: Payer: Self-pay | Admitting: Hematology

## 2023-01-05 ENCOUNTER — Other Ambulatory Visit: Payer: Self-pay | Admitting: Family Medicine

## 2023-01-07 ENCOUNTER — Other Ambulatory Visit (HOSPITAL_COMMUNITY): Payer: Self-pay

## 2023-01-07 ENCOUNTER — Other Ambulatory Visit: Payer: Self-pay

## 2023-01-07 ENCOUNTER — Encounter (HOSPITAL_COMMUNITY): Payer: Self-pay | Admitting: Hematology

## 2023-01-08 DIAGNOSIS — H35372 Puckering of macula, left eye: Secondary | ICD-10-CM | POA: Diagnosis not present

## 2023-01-08 DIAGNOSIS — H2513 Age-related nuclear cataract, bilateral: Secondary | ICD-10-CM | POA: Diagnosis not present

## 2023-01-16 ENCOUNTER — Inpatient Hospital Stay: Payer: Medicaid Other

## 2023-01-16 ENCOUNTER — Inpatient Hospital Stay: Payer: Medicaid Other | Attending: Hematology

## 2023-01-16 VITALS — BP 130/72 | HR 62 | Temp 97.7°F | Resp 18

## 2023-01-16 DIAGNOSIS — C7951 Secondary malignant neoplasm of bone: Secondary | ICD-10-CM | POA: Insufficient documentation

## 2023-01-16 DIAGNOSIS — C61 Malignant neoplasm of prostate: Secondary | ICD-10-CM | POA: Diagnosis present

## 2023-01-16 LAB — COMPREHENSIVE METABOLIC PANEL
ALT: 14 U/L (ref 0–44)
AST: 18 U/L (ref 15–41)
Albumin: 3.7 g/dL (ref 3.5–5.0)
Alkaline Phosphatase: 45 U/L (ref 38–126)
Anion gap: 10 (ref 5–15)
BUN: 21 mg/dL (ref 8–23)
CO2: 26 mmol/L (ref 22–32)
Calcium: 9 mg/dL (ref 8.9–10.3)
Chloride: 103 mmol/L (ref 98–111)
Creatinine, Ser: 1.57 mg/dL — ABNORMAL HIGH (ref 0.61–1.24)
GFR, Estimated: 44 mL/min — ABNORMAL LOW (ref >60)
Glucose, Bld: 95 mg/dL (ref 70–99)
Potassium: 3.6 mmol/L (ref 3.5–5.1)
Sodium: 139 mmol/L (ref 135–145)
Total Bilirubin: 1.2 mg/dL (ref 0.3–1.2)
Total Protein: 6.8 g/dL (ref 6.5–8.1)

## 2023-01-16 MED ORDER — EPINEPHRINE HCL 0.1 MG/ML IJ SOLN: 0.2500 mg | Freq: Once | INTRAMUSCULAR | Status: DC | PRN

## 2023-01-16 MED ORDER — DENOSUMAB 120 MG/1.7ML ~~LOC~~ SOLN
120.0000 mg | Freq: Once | SUBCUTANEOUS | Status: AC
  Administered 2023-01-16: 120 mg via SUBCUTANEOUS
  Filled 2023-01-16: qty 1.7

## 2023-01-16 MED ORDER — SODIUM CHLORIDE 0.9 % IV SOLN: Freq: Once | INTRAVENOUS | Status: DC | PRN

## 2023-01-16 MED ORDER — ALBUTEROL SULFATE (2.5 MG/3ML) 0.083% IN NEBU: 2.5000 mg | INHALATION_SOLUTION | Freq: Once | RESPIRATORY_TRACT | Status: DC | PRN

## 2023-01-16 MED ORDER — DIPHENHYDRAMINE HCL 50 MG/ML IJ SOLN: 50.0000 mg | Freq: Once | INTRAMUSCULAR | Status: DC | PRN

## 2023-01-16 MED ORDER — METHYLPREDNISOLONE SODIUM SUCC 125 MG IJ SOLR: 125.0000 mg | Freq: Once | INTRAMUSCULAR | Status: DC | PRN

## 2023-01-16 MED ORDER — DIPHENHYDRAMINE HCL 50 MG/ML IJ SOLN: 25.0000 mg | Freq: Once | INTRAMUSCULAR | Status: DC | PRN

## 2023-01-16 NOTE — Progress Notes (Signed)
Patient in clinic today for Xgeva injection. Patients Calcium 9.0 and Creatinine 1.57. Pharmacy advised to let patient know to drink more water to lower Creatinine. See MAR for injection information. Patient remained stable throughout injection. Patient discharged from clinic in wheelchair and in stable condition.

## 2023-01-16 NOTE — Patient Instructions (Signed)
[facility]  Discharge Instructions: Thank you for choosing [facility] to provide your oncology and hematology care.  If you have a lab appointment with the Cancer Center - please note that after April 8th, 2024, all labs will be drawn in the cancer center.  You do not have to check in or register with the main entrance as you have in the past but will complete your check-in in the cancer center.  Wear comfortable clothing and clothing appropriate for easy access to any Portacath or PICC line.   We strive to give you quality time with your provider. You may need to reschedule your appointment if you arrive late (15 or more minutes).  Arriving late affects you and other patients whose appointments are after yours.  Also, if you miss three or more appointments without notifying the office, you may be dismissed from the clinic at the provider's discretion.      For prescription refill requests, have your pharmacy contact our office and allow 72 hours for refills to be completed.    Today you received the following Xgeva injection.     To help prevent nausea and vomiting after your treatment, we encourage you to take your nausea medication as directed.  BELOW ARE SYMPTOMS THAT SHOULD BE REPORTED IMMEDIATELY: *FEVER GREATER THAN 100.4 F (38 C) OR HIGHER *CHILLS OR SWEATING *NAUSEA AND VOMITING THAT IS NOT CONTROLLED WITH YOUR NAUSEA MEDICATION *UNUSUAL SHORTNESS OF BREATH *UNUSUAL BRUISING OR BLEEDING *URINARY PROBLEMS (pain or burning when urinating, or frequent urination) *BOWEL PROBLEMS (unusual diarrhea, constipation, pain near the anus) TENDERNESS IN MOUTH AND THROAT WITH OR WITHOUT PRESENCE OF ULCERS (sore throat, sores in mouth, or a toothache) UNUSUAL RASH, SWELLING OR PAIN  UNUSUAL VAGINAL DISCHARGE OR ITCHING   Items with * indicate a potential emergency and should be followed up as soon as possible or go to the Emergency Department if any problems  should occur.  Please show the CHEMOTHERAPY ALERT CARD or IMMUNOTHERAPY ALERT CARD at check-in to the Emergency Department and triage nurse.  Should you have questions after your visit or need to cancel or reschedule your appointment, please contact [facility] [phone]  and follow the prompts.  Office hours are 8:00 a.m. to 4:30 p.m. Monday - Friday. Please note that voicemails left after 4:00 p.m. may not be returned until the following business day.  We are closed weekends and major holidays. You have access to a nurse at all times for urgent questions. Please call the main number to the clinic [phone] and follow the prompts.  For any non-urgent questions, you may also contact your provider using MyChart. We now offer e-Visits for anyone 77 and older to request care online for non-urgent symptoms. For details visit [email or web address].   Also download the MyChart app! Go to the app store, search "MyChart", open the app, select [facility], and log in with your MyChart username and password.

## 2023-01-25 ENCOUNTER — Other Ambulatory Visit: Payer: Self-pay

## 2023-01-25 ENCOUNTER — Other Ambulatory Visit (HOSPITAL_COMMUNITY): Payer: Self-pay

## 2023-01-25 ENCOUNTER — Other Ambulatory Visit: Payer: Self-pay | Admitting: Hematology

## 2023-01-25 DIAGNOSIS — C61 Malignant neoplasm of prostate: Secondary | ICD-10-CM

## 2023-01-25 NOTE — Progress Notes (Signed)
Specialty Pharmacy Refill Coordination Note  Carlos Harper is a 82 y.o. male contacted today regarding refills of specialty medication(s) Abiraterone Acetate   Patient requested Delivery   Delivery date: 02/04/23   Verified address: [address] [place] [address]   Medication will be filled on 02/01/23.

## 2023-01-28 ENCOUNTER — Other Ambulatory Visit (HOSPITAL_COMMUNITY): Payer: Self-pay

## 2023-01-28 MED ORDER — PREDNISONE 5 MG PO TABS
5.0000 mg | ORAL_TABLET | Freq: Every day | ORAL | 6 refills | Status: DC
  Filled 2023-01-28: qty 30, 30d supply, fill #0
  Filled 2023-02-22 – 2023-02-25 (×2): qty 30, 30d supply, fill #1
  Filled 2023-03-24: qty 30, 30d supply, fill #2
  Filled 2023-04-23: qty 30, 30d supply, fill #3
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
## 2023-02-13 ENCOUNTER — Encounter: Payer: Self-pay | Admitting: Hematology

## 2023-02-13 ENCOUNTER — Inpatient Hospital Stay: Payer: Medicaid Other

## 2023-02-13 ENCOUNTER — Inpatient Hospital Stay: Payer: Medicaid Other | Attending: Hematology | Admitting: Hematology

## 2023-02-13 ENCOUNTER — Other Ambulatory Visit: Payer: Self-pay

## 2023-02-13 VITALS — BP 133/79 | HR 69 | Temp 98.3°F | Resp 20 | Wt 172.7 lb

## 2023-02-13 DIAGNOSIS — G629 Polyneuropathy, unspecified: Secondary | ICD-10-CM | POA: Insufficient documentation

## 2023-02-13 DIAGNOSIS — C61 Malignant neoplasm of prostate: Secondary | ICD-10-CM

## 2023-02-13 DIAGNOSIS — C7951 Secondary malignant neoplasm of bone: Secondary | ICD-10-CM | POA: Insufficient documentation

## 2023-02-13 DIAGNOSIS — N189 Chronic kidney disease, unspecified: Secondary | ICD-10-CM | POA: Diagnosis not present

## 2023-02-13 DIAGNOSIS — I129 Hypertensive chronic kidney disease with stage 1 through stage 4 chronic kidney disease, or unspecified chronic kidney disease: Secondary | ICD-10-CM | POA: Diagnosis not present

## 2023-02-13 DIAGNOSIS — Z7952 Long term (current) use of systemic steroids: Secondary | ICD-10-CM | POA: Diagnosis not present

## 2023-02-13 DIAGNOSIS — D631 Anemia in chronic kidney disease: Secondary | ICD-10-CM | POA: Diagnosis not present

## 2023-02-13 LAB — IRON AND TIBC
Iron: 49 ug/dL (ref 45–182)
Saturation Ratios: 16 % — ABNORMAL LOW (ref 17.9–39.5)
TIBC: 308 ug/dL (ref 250–450)
UIBC: 259 ug/dL

## 2023-02-13 LAB — CBC WITH DIFFERENTIAL/PLATELET
Abs Immature Granulocytes: 0.01 10*3/uL (ref 0.00–0.07)
Basophils Absolute: 0 10*3/uL (ref 0.0–0.1)
Basophils Relative: 0 %
Eosinophils Absolute: 0.1 10*3/uL (ref 0.0–0.5)
Eosinophils Relative: 1 %
HCT: 34.7 % — ABNORMAL LOW (ref 39.0–52.0)
Hemoglobin: 11.5 g/dL — ABNORMAL LOW (ref 13.0–17.0)
Immature Granulocytes: 0 %
Lymphocytes Relative: 38 %
Lymphs Abs: 2.5 10*3/uL (ref 0.7–4.0)
MCH: 31.9 pg (ref 26.0–34.0)
MCHC: 33.1 g/dL (ref 30.0–36.0)
MCV: 96.1 fL (ref 80.0–100.0)
Monocytes Absolute: 0.6 10*3/uL (ref 0.1–1.0)
Monocytes Relative: 9 %
Neutro Abs: 3.3 10*3/uL (ref 1.7–7.7)
Neutrophils Relative %: 52 %
Platelets: 211 10*3/uL (ref 150–400)
RBC: 3.61 MIL/uL — ABNORMAL LOW (ref 4.22–5.81)
RDW: 13.1 % (ref 11.5–15.5)
WBC: 6.4 10*3/uL (ref 4.0–10.5)
nRBC: 0 % (ref 0.0–0.2)

## 2023-02-13 LAB — COMPREHENSIVE METABOLIC PANEL
ALT: 14 U/L (ref 0–44)
AST: 18 U/L (ref 15–41)
Albumin: 3.9 g/dL (ref 3.5–5.0)
Alkaline Phosphatase: 57 U/L (ref 38–126)
Anion gap: 12 (ref 5–15)
BUN: 21 mg/dL (ref 8–23)
CO2: 25 mmol/L (ref 22–32)
Calcium: 9.4 mg/dL (ref 8.9–10.3)
Chloride: 102 mmol/L (ref 98–111)
Creatinine, Ser: 1.55 mg/dL — ABNORMAL HIGH (ref 0.61–1.24)
GFR, Estimated: 44 mL/min — ABNORMAL LOW (ref >60)
Glucose, Bld: 89 mg/dL (ref 70–99)
Potassium: 3.7 mmol/L (ref 3.5–5.1)
Sodium: 139 mmol/L (ref 135–145)
Total Bilirubin: 1 mg/dL (ref 0.3–1.2)
Total Protein: 7.4 g/dL (ref 6.5–8.1)

## 2023-02-13 LAB — FERRITIN: Ferritin: 234 ng/mL (ref 24–336)

## 2023-02-13 LAB — PSA: Prostatic Specific Antigen: 0.29 ng/mL (ref 0.00–4.00)

## 2023-02-13 MED ORDER — AMOXICILLIN-POT CLAVULANATE 875-125 MG PO TABS: 1.0000 | ORAL_TABLET | Freq: Two times a day (BID) | ORAL | 0 refills | Status: DC

## 2023-02-13 MED ORDER — DENOSUMAB 120 MG/1.7ML ~~LOC~~ SOLN
120.0000 mg | Freq: Once | SUBCUTANEOUS | Status: AC
Start: 2023-02-13 — End: 2023-02-13
  Administered 2023-02-13: 120 mg via SUBCUTANEOUS
  Filled 2023-02-13: qty 1.7

## 2023-02-13 NOTE — Progress Notes (Signed)
Patient is taking Zytiga as prescribed. He has has missed two doses due to a delay in refill from the pharmacy, but reports that he is currently taking the medication as prescribed and reports no side effects at this time.

## 2023-02-13 NOTE — Patient Instructions (Signed)
[facility] at [facility] Discharge Instructions   You were seen and examined today by Dr. Ellin Saba.  He reviewed the results of your lab work which are normal/stable.   Continue Zytiga as prescribed.   We sent an antibiotic to your pharmacy for the abscess on the back of your neck. Take as prescribed until complete.   You will receive Xgeva (bone shot) today and monthly.   We will see you back in 3 months. We will repeat lab work prior to this visit.   Return as scheduled.    Thank you for choosing [facility] at [facility] to provide your oncology and hematology care.  To afford each patient quality time with our provider, please arrive at least 15 minutes before your scheduled appointment time.   If you have a lab appointment with the Cancer Center please come in thru the Main Entrance and check in at the main information desk.  You need to re-schedule your appointment should you arrive 10 or more minutes late.  We strive to give you quality time with our providers, and arriving late affects you and other patients whose appointments are after yours.  Also, if you no show three or more times for appointments you may be dismissed from the clinic at the providers discretion.     Again, thank you for choosing [facility].  Our hope is that these requests will decrease the amount of time that you wait before being seen by our physicians.       _____________________________________________________________  Should you have questions after your visit to [facility], please contact our office at [phone] and follow the prompts.  Our office hours are 8:00 a.m. and 4:30 p.m. Monday - Friday.  Please note that voicemails left after 4:00 p.m. may not be returned until the following business day.  We are closed weekends and major holidays.  You do have access to a nurse 24-7, just call the main number to the clinic  [phone] and do not press any options, hold on the line and a nurse will answer the phone.    For prescription refill requests, have your pharmacy contact our office and allow 72 hours.    Due to Covid, you will need to wear a mask upon entering the hospital. If you do not have a mask, a mask will be given to you at the Main Entrance upon arrival. For doctor visits, patients may have 1 support person age 22 or older with them. For treatment visits, patients can not have anyone with them due to social distancing guidelines and our immunocompromised population.

## 2023-02-13 NOTE — Progress Notes (Signed)
Patient presents today fro Xgeva injection per providers order.  Calcium noted to be 9.4.  MD reviewed labs and vitals.  Message received from Chapman Moss RN/Dr. Ellin Saba patient is okay for treatment.    Patient is taking Calcium/vitamin D supplements, has had no jaw pain or dental work prior or upcoming.   Stable during administration without incident; injection site WNL; see MAR for injection details.  Patient tolerated procedure well and without incident.  No questions or complaints noted at this time.

## 2023-02-13 NOTE — Patient Instructions (Signed)
[facility]  Discharge Instructions: Thank you for choosing [facility] to provide your oncology and hematology care.  If you have a lab appointment with the [facility] - please note that after April 8th, 2024, all labs will be drawn in the cancer center.  You do not have to check in or register with the main entrance as you have in the past but will complete your check-in in the cancer center.  Wear comfortable clothing and clothing appropriate for easy access to any Portacath or PICC line.   We strive to give you quality time with your provider. You may need to reschedule your appointment if you arrive late (15 or more minutes).  Arriving late affects you and other patients whose appointments are after yours.  Also, if you miss three or more appointments without notifying the office, you may be dismissed from the clinic at the provider's discretion.      For prescription refill requests, have your pharmacy contact our office and allow 72 hours for refills to be completed.    Today you received the following chemotherapy and/or immunotherapy agents Xgeva      To help prevent nausea and vomiting after your treatment, we encourage you to take your nausea medication as directed.  BELOW ARE SYMPTOMS THAT SHOULD BE REPORTED IMMEDIATELY: *FEVER GREATER THAN 100.4 F (38 C) OR HIGHER *CHILLS OR SWEATING *NAUSEA AND VOMITING THAT IS NOT CONTROLLED WITH YOUR NAUSEA MEDICATION *UNUSUAL SHORTNESS OF BREATH *UNUSUAL BRUISING OR BLEEDING *URINARY PROBLEMS (pain or burning when urinating, or frequent urination) *BOWEL PROBLEMS (unusual diarrhea, constipation, pain near the anus) TENDERNESS IN MOUTH AND THROAT WITH OR WITHOUT PRESENCE OF ULCERS (sore throat, sores in mouth, or a toothache) UNUSUAL RASH, SWELLING OR PAIN  UNUSUAL VAGINAL DISCHARGE OR ITCHING   Items with * indicate a potential emergency and should be followed up as soon as possible or go to the  Emergency Department if any problems should occur.  Please show the CHEMOTHERAPY ALERT CARD or IMMUNOTHERAPY ALERT CARD at check-in to the Emergency Department and triage nurse.  Should you have questions after your visit or need to cancel or reschedule your appointment, please contact [facility] [phone]  and follow the prompts.  Office hours are 8:00 a.m. to 4:30 p.m. Monday - Friday. Please note that voicemails left after 4:00 p.m. may not be returned until the following business day.  We are closed weekends and major holidays. You have access to a nurse at all times for urgent questions. Please call the main number to the clinic [phone] and follow the prompts.  For any non-urgent questions, you may also contact your provider using MyChart. We now offer e-Visits for anyone 67 and older to request care online for non-urgent symptoms. For details visit [email or web address].   Also download the MyChart app! Go to the app store, search "MyChart", open the app, select [facility], and log in with your MyChart username and password.

## 2023-02-15 ENCOUNTER — Other Ambulatory Visit: Payer: Self-pay | Admitting: Family Medicine

## 2023-02-19 ENCOUNTER — Encounter: Payer: Self-pay | Admitting: Family Medicine

## 2023-02-19 ENCOUNTER — Ambulatory Visit (INDEPENDENT_AMBULATORY_CARE_PROVIDER_SITE_OTHER): Payer: Medicaid Other | Admitting: Family Medicine

## 2023-02-19 ENCOUNTER — Other Ambulatory Visit (HOSPITAL_COMMUNITY): Payer: Self-pay

## 2023-02-19 VITALS — BP 120/70 | HR 97 | Temp 98.5°F | Ht 69.0 in | Wt 172.0 lb

## 2023-02-19 DIAGNOSIS — L723 Sebaceous cyst: Secondary | ICD-10-CM | POA: Insufficient documentation

## 2023-02-19 NOTE — Assessment & Plan Note (Signed)
Anesthetized the skin with 0.1% lidocaine. Made a 1 cm vertical incision in the center of the cyst. I opened the cyst with a pair of hemostats. Expressed foul-smelling cyst sac contents. Clean the cyst cavity with a Q-tip soaked in peroxide. Packed the cyst with 6 inches of 1/4 inch iodoform gauze. Instructed to remove 1/2in packing daily until fully removed and cover with clean dressing. Report to office for signs of infections such as fever, chills, body aches, purulent drainage, or if symptoms not improved.
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
## 2023-02-21 ENCOUNTER — Encounter: Payer: Self-pay | Admitting: Family Medicine

## 2023-02-21 ENCOUNTER — Ambulatory Visit (INDEPENDENT_AMBULATORY_CARE_PROVIDER_SITE_OTHER): Payer: Medicaid Other | Admitting: Family Medicine

## 2023-02-21 VITALS — BP 122/80 | HR 82 | Temp 98.5°F | Ht 69.0 in | Wt 173.1 lb

## 2023-02-21 DIAGNOSIS — L723 Sebaceous cyst: Secondary | ICD-10-CM
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
## 2023-02-21 NOTE — Assessment & Plan Note (Signed)
Packing removed today and remaining contents drained. Incision was covered with clean bandage. Return to office for signs of infections such as fever, chills, body aches, purulent drainage, or if symptoms not improved.

## 2023-02-22 ENCOUNTER — Other Ambulatory Visit: Payer: Self-pay

## 2023-02-22 ENCOUNTER — Other Ambulatory Visit (HOSPITAL_COMMUNITY): Payer: Self-pay

## 2023-02-25 ENCOUNTER — Other Ambulatory Visit: Payer: Self-pay

## 2023-02-25 NOTE — Progress Notes (Signed)
Specialty Pharmacy Refill Coordination Note  Carlos Harper is a 82 y.o. male contacted today regarding refills of specialty medication(s) Abiraterone Acetate Spoke with patient's daughter, Billy Fischer.  Patient requested Delivery   Delivery date: 03/04/23   Verified address: [address] [place] [id number]   Medication will be filled on 03/01/23.

## 2023-02-27 ENCOUNTER — Other Ambulatory Visit (HOSPITAL_COMMUNITY): Payer: Self-pay

## 2023-02-28 ENCOUNTER — Other Ambulatory Visit (HOSPITAL_COMMUNITY): Payer: Self-pay

## 2023-03-08 ENCOUNTER — Other Ambulatory Visit: Payer: Self-pay | Admitting: Family Medicine

## 2023-03-13 ENCOUNTER — Inpatient Hospital Stay: Payer: Medicaid Other

## 2023-03-13 ENCOUNTER — Inpatient Hospital Stay: Payer: Medicaid Other | Attending: Hematology

## 2023-03-13 VITALS — BP 150/90 | HR 58 | Temp 98.5°F | Resp 18

## 2023-03-13 DIAGNOSIS — C61 Malignant neoplasm of prostate: Secondary | ICD-10-CM | POA: Diagnosis present

## 2023-03-13 DIAGNOSIS — C7951 Secondary malignant neoplasm of bone: Secondary | ICD-10-CM | POA: Insufficient documentation

## 2023-03-13 LAB — COMPREHENSIVE METABOLIC PANEL
ALT: 14 U/L (ref 0–44)
AST: 18 U/L (ref 15–41)
Albumin: 3.8 g/dL (ref 3.5–5.0)
Alkaline Phosphatase: 48 U/L (ref 38–126)
Anion gap: 9 (ref 5–15)
BUN: 18 mg/dL (ref 8–23)
CO2: 25 mmol/L (ref 22–32)
Calcium: 9 mg/dL (ref 8.9–10.3)
Chloride: 104 mmol/L (ref 98–111)
Creatinine, Ser: 1.42 mg/dL — ABNORMAL HIGH (ref 0.61–1.24)
GFR, Estimated: 49 mL/min — ABNORMAL LOW (ref >60)
Glucose, Bld: 81 mg/dL (ref 70–99)
Potassium: 3.9 mmol/L (ref 3.5–5.1)
Sodium: 138 mmol/L (ref 135–145)
Total Bilirubin: 0.8 mg/dL (ref <1.2)
Total Protein: 7.1 g/dL (ref 6.5–8.1)

## 2023-03-13 MED ORDER — DENOSUMAB 120 MG/1.7ML ~~LOC~~ SOLN
120.0000 mg | Freq: Once | SUBCUTANEOUS | Status: AC
  Administered 2023-03-13: 120 mg via SUBCUTANEOUS
  Filled 2023-03-13: qty 1.7

## 2023-03-13 NOTE — Patient Instructions (Signed)
[facility]  Discharge Instructions  You received Carlos Harper injection today.   Return as scheduled.    Thank you for choosing [name] to provide your oncology and hematology care.   To afford each patient quality time with our provider, please arrive at least 15 minutes before your scheduled appointment time. You may need to reschedule your appointment if you arrive late (10 or more minutes). Arriving late affects you and other patients whose appointments are after yours.  Also, if you miss three or more appointments without notifying the office, you may be dismissed from the clinic at the provider's discretion.    Again, thank you for choosing [facility].  Our hope is that these requests will decrease the amount of time that you wait before being seen by our physicians.   If you have a lab appointment with the Cancer Center - please note that after April 8th, all labs will be drawn in the cancer center.  You do not have to check in or register with the main entrance as you have in the past but will complete your check-in at the cancer center.            _____________________________________________________________  Should you have questions after your visit to [facility], please contact our office at [phone] and follow the prompts.  Our office hours are 8:00 a.m. to 4:30 p.m. Monday - Thursday and 8:00 a.m. to 2:30 p.m. Friday.  Please note that voicemails left after 4:00 p.m. may not be returned until the following business day.  We are closed weekends and all major holidays.  You do have access to a nurse 24-7, just call the main number to the clinic [phone] and do not press any options, hold on the line and a nurse will answer the phone.    For prescription refill requests, have your pharmacy contact our office and allow 72 hours.    Masks are no longer required in the cancer centers. If you would  like for your care team to wear a mask while they are taking care of you, please let them know. You may have one support person who is at least 83 years old accompany you for your appointments.

## 2023-03-13 NOTE — Progress Notes (Signed)
Carlos Harper presents today for injection per the provider's orders. Xgeva administration without incident; injection site WNL; see MAR for injection details.  Patient tolerated procedure well and without incident.  No questions or complaints noted at this time.

## 2023-03-24 ENCOUNTER — Other Ambulatory Visit (HOSPITAL_COMMUNITY): Payer: Self-pay

## 2023-03-26 ENCOUNTER — Other Ambulatory Visit: Payer: Self-pay

## 2023-03-26 NOTE — Progress Notes (Signed)
Specialty Pharmacy Refill Coordination Note  Carlos Harper is a 82 y.o. male contacted today regarding refills of specialty medication(s) Abiraterone Acetate   Patient requested Delivery   Delivery date: 04/03/23   Verified address: [address] [place] [address]   Medication will be filled on 04/02/23.

## 2023-04-02 ENCOUNTER — Other Ambulatory Visit: Payer: Self-pay

## 2023-04-09 ENCOUNTER — Other Ambulatory Visit: Payer: Self-pay

## 2023-04-09 DIAGNOSIS — C61 Malignant neoplasm of prostate: Secondary | ICD-10-CM

## 2023-04-10 ENCOUNTER — Inpatient Hospital Stay: Payer: Medicaid Other

## 2023-04-10 ENCOUNTER — Inpatient Hospital Stay: Payer: Medicaid Other | Attending: Hematology

## 2023-04-10 VITALS — BP 139/86 | HR 68 | Temp 96.7°F | Resp 18

## 2023-04-10 DIAGNOSIS — C7951 Secondary malignant neoplasm of bone: Secondary | ICD-10-CM | POA: Diagnosis present

## 2023-04-10 DIAGNOSIS — C61 Malignant neoplasm of prostate: Secondary | ICD-10-CM | POA: Insufficient documentation

## 2023-04-10 LAB — COMPREHENSIVE METABOLIC PANEL
ALT: 13 U/L (ref 0–44)
AST: 16 U/L (ref 15–41)
Albumin: 3.7 g/dL (ref 3.5–5.0)
Alkaline Phosphatase: 46 U/L (ref 38–126)
Anion gap: 8 (ref 5–15)
BUN: 16 mg/dL (ref 8–23)
CO2: 25 mmol/L (ref 22–32)
Calcium: 9 mg/dL (ref 8.9–10.3)
Chloride: 105 mmol/L (ref 98–111)
Creatinine, Ser: 1.45 mg/dL — ABNORMAL HIGH (ref 0.61–1.24)
GFR, Estimated: 48 mL/min — ABNORMAL LOW (ref >60)
Glucose, Bld: 74 mg/dL (ref 70–99)
Potassium: 3.6 mmol/L (ref 3.5–5.1)
Sodium: 138 mmol/L (ref 135–145)
Total Bilirubin: 0.6 mg/dL (ref <1.2)
Total Protein: 7 g/dL (ref 6.5–8.1)

## 2023-04-10 MED ORDER — DENOSUMAB 120 MG/1.7ML ~~LOC~~ SOLN
120.0000 mg | Freq: Once | SUBCUTANEOUS | Status: AC
  Administered 2023-04-10: 120 mg via SUBCUTANEOUS
  Filled 2023-04-10: qty 1.7

## 2023-04-10 NOTE — Progress Notes (Signed)
Patient presents today for Xgeva injection per providers order.  Vital signs and labs within parameters for injection.  Patient is taking Calcium and vitamin D, has had no prior or upcoming dental work and no jaw pain.

## 2023-04-10 NOTE — Patient Instructions (Signed)
[facility] [place] - A DEPT OF [facility]  Discharge Instructions: Thank you for choosing [facility] to provide your oncology and hematology care.  If you have a lab appointment with the Cancer Center - please note that after April 8th, 2024, all labs will be drawn in the cancer center.  You do not have to check in or register with the main entrance as you have in the past but will complete your check-in in the cancer center.  Wear comfortable clothing and clothing appropriate for easy access to any Portacath or PICC line.   We strive to give you quality time with your provider. You may need to reschedule your appointment if you arrive late (15 or more minutes).  Arriving late affects you and other patients whose appointments are after yours.  Also, if you miss three or more appointments without notifying the office, you may be dismissed from the clinic at the provider's discretion.      For prescription refill requests, have your pharmacy contact our office and allow 72 hours for refills to be completed.    Today you received the following chemotherapy and/or immunotherapy agents Xgeva      To help prevent nausea and vomiting after your treatment, we encourage you to take your nausea medication as directed.  BELOW ARE SYMPTOMS THAT SHOULD BE REPORTED IMMEDIATELY: *FEVER GREATER THAN 100.4 F (38 C) OR HIGHER *CHILLS OR SWEATING *NAUSEA AND VOMITING THAT IS NOT CONTROLLED WITH YOUR NAUSEA MEDICATION *UNUSUAL SHORTNESS OF BREATH *UNUSUAL BRUISING OR BLEEDING *URINARY PROBLEMS (pain or burning when urinating, or frequent urination) *BOWEL PROBLEMS (unusual diarrhea, constipation, pain near the anus) TENDERNESS IN MOUTH AND THROAT WITH OR WITHOUT PRESENCE OF ULCERS (sore throat, sores in mouth, or a toothache) UNUSUAL RASH, SWELLING OR PAIN  UNUSUAL VAGINAL DISCHARGE OR ITCHING   Items with * indicate a potential emergency and should be followed up as  soon as possible or go to the Emergency Department if any problems should occur.  Please show the CHEMOTHERAPY ALERT CARD or IMMUNOTHERAPY ALERT CARD at check-in to the Emergency Department and triage nurse.  Should you have questions after your visit or need to cancel or reschedule your appointment, please contact [facility] [place] - A DEPT OF [facility] [phone]  and follow the prompts.  Office hours are 8:00 a.m. to 4:30 p.m. Monday - Friday. Please note that voicemails left after 4:00 p.m. may not be returned until the following business day.  We are closed weekends and major holidays. You have access to a nurse at all times for urgent questions. Please call the main number to the clinic [phone] and follow the prompts.  For any non-urgent questions, you may also contact your provider using MyChart. We now offer e-Visits for anyone 65 and older to request care online for non-urgent symptoms. For details visit [email or web address].   Also download the MyChart app! Go to the app store, search "MyChart", open the app, select [place], and log in with your MyChart username and password.

## 2023-04-15 ENCOUNTER — Other Ambulatory Visit: Payer: Self-pay

## 2023-04-23 ENCOUNTER — Other Ambulatory Visit: Payer: Self-pay

## 2023-04-23 ENCOUNTER — Other Ambulatory Visit (HOSPITAL_COMMUNITY): Payer: Self-pay

## 2023-04-23 ENCOUNTER — Other Ambulatory Visit: Payer: Self-pay | Admitting: Hematology

## 2023-04-23 DIAGNOSIS — C61 Malignant neoplasm of prostate: Secondary | ICD-10-CM

## 2023-04-23 MED ORDER — ABIRATERONE ACETATE 250 MG PO TABS
ORAL_TABLET | Freq: Every day | ORAL | 3 refills | Status: DC
  Filled 2023-04-25: qty 120, 30d supply, fill #0

## 2023-04-23 NOTE — Progress Notes (Signed)
 Specialty Pharmacy Refill Coordination Note  Carlos Harper is a 82 y.o. male contacted today regarding refills of specialty medication(s) Abiraterone  Acetate (ZYTIGA )   Patient requested Delivery   Delivery date: 05/01/23   Verified address: [address]  [place] [address]   Medication will be filled on 04/30/23.   Pending refill request

## 2023-04-25 ENCOUNTER — Other Ambulatory Visit (HOSPITAL_COMMUNITY): Payer: Self-pay

## 2023-05-06 ENCOUNTER — Other Ambulatory Visit: Payer: Self-pay

## 2023-05-06 DIAGNOSIS — E538 Deficiency of other specified B group vitamins: Secondary | ICD-10-CM

## 2023-05-06 DIAGNOSIS — D500 Iron deficiency anemia secondary to blood loss (chronic): Secondary | ICD-10-CM

## 2023-05-06 DIAGNOSIS — C61 Malignant neoplasm of prostate: Secondary | ICD-10-CM

## 2023-05-07 ENCOUNTER — Inpatient Hospital Stay: Payer: Medicaid Other

## 2023-05-07 ENCOUNTER — Inpatient Hospital Stay: Payer: Medicaid Other | Attending: Hematology

## 2023-05-07 DIAGNOSIS — D500 Iron deficiency anemia secondary to blood loss (chronic): Secondary | ICD-10-CM

## 2023-05-07 DIAGNOSIS — C7951 Secondary malignant neoplasm of bone: Secondary | ICD-10-CM | POA: Diagnosis present

## 2023-05-07 DIAGNOSIS — N189 Chronic kidney disease, unspecified: Secondary | ICD-10-CM | POA: Diagnosis not present

## 2023-05-07 DIAGNOSIS — G629 Polyneuropathy, unspecified: Secondary | ICD-10-CM | POA: Diagnosis not present

## 2023-05-07 DIAGNOSIS — E538 Deficiency of other specified B group vitamins: Secondary | ICD-10-CM

## 2023-05-07 DIAGNOSIS — C61 Malignant neoplasm of prostate: Secondary | ICD-10-CM | POA: Insufficient documentation

## 2023-05-07 DIAGNOSIS — D631 Anemia in chronic kidney disease: Secondary | ICD-10-CM | POA: Insufficient documentation

## 2023-05-07 DIAGNOSIS — I129 Hypertensive chronic kidney disease with stage 1 through stage 4 chronic kidney disease, or unspecified chronic kidney disease: Secondary | ICD-10-CM | POA: Insufficient documentation

## 2023-05-07 LAB — COMPREHENSIVE METABOLIC PANEL
ALT: 11 U/L (ref 0–44)
AST: 17 U/L (ref 15–41)
Albumin: 3.8 g/dL (ref 3.5–5.0)
Alkaline Phosphatase: 49 U/L (ref 38–126)
Anion gap: 9 (ref 5–15)
BUN: 21 mg/dL (ref 8–23)
CO2: 25 mmol/L (ref 22–32)
Calcium: 9.6 mg/dL (ref 8.9–10.3)
Chloride: 104 mmol/L (ref 98–111)
Creatinine, Ser: 1.3 mg/dL — ABNORMAL HIGH (ref 0.61–1.24)
GFR, Estimated: 55 mL/min — ABNORMAL LOW (ref >60)
Glucose, Bld: 89 mg/dL (ref 70–99)
Potassium: 4.1 mmol/L (ref 3.5–5.1)
Sodium: 138 mmol/L (ref 135–145)
Total Bilirubin: 1 mg/dL (ref 0.0–1.2)
Total Protein: 7.1 g/dL (ref 6.5–8.1)

## 2023-05-07 LAB — FERRITIN: Ferritin: 222 ng/mL (ref 24–336)

## 2023-05-07 LAB — CBC WITH DIFFERENTIAL/PLATELET
Abs Immature Granulocytes: 0.01 10*3/uL (ref 0.00–0.07)
Basophils Absolute: 0 10*3/uL (ref 0.0–0.1)
Basophils Relative: 0 %
Eosinophils Absolute: 0 10*3/uL (ref 0.0–0.5)
Eosinophils Relative: 0 %
HCT: 38.4 % — ABNORMAL LOW (ref 39.0–52.0)
Hemoglobin: 12.8 g/dL — ABNORMAL LOW (ref 13.0–17.0)
Immature Granulocytes: 0 %
Lymphocytes Relative: 30 %
Lymphs Abs: 1.5 10*3/uL (ref 0.7–4.0)
MCH: 30.7 pg (ref 26.0–34.0)
MCHC: 33.3 g/dL (ref 30.0–36.0)
MCV: 92.1 fL (ref 80.0–100.0)
Monocytes Absolute: 0.3 10*3/uL (ref 0.1–1.0)
Monocytes Relative: 6 %
Neutro Abs: 3 10*3/uL (ref 1.7–7.7)
Neutrophils Relative %: 64 %
Platelets: 221 10*3/uL (ref 150–400)
RBC: 4.17 MIL/uL — ABNORMAL LOW (ref 4.22–5.81)
RDW: 13.2 % (ref 11.5–15.5)
WBC: 4.8 10*3/uL (ref 4.0–10.5)
nRBC: 0 % (ref 0.0–0.2)

## 2023-05-07 LAB — PSA: Prostatic Specific Antigen: 0.59 ng/mL (ref 0.00–4.00)

## 2023-05-07 LAB — IRON AND TIBC
Iron: 58 ug/dL (ref 45–182)
Saturation Ratios: 17 % — ABNORMAL LOW (ref 17.9–39.5)
TIBC: 349 ug/dL (ref 250–450)
UIBC: 291 ug/dL

## 2023-05-08 ENCOUNTER — Other Ambulatory Visit: Payer: Self-pay | Admitting: Family Medicine

## 2023-05-08 ENCOUNTER — Inpatient Hospital Stay: Payer: Medicaid Other

## 2023-05-08 ENCOUNTER — Telehealth: Payer: Self-pay | Admitting: Pharmacist

## 2023-05-08 ENCOUNTER — Inpatient Hospital Stay (HOSPITAL_BASED_OUTPATIENT_CLINIC_OR_DEPARTMENT_OTHER): Payer: Medicaid Other | Admitting: Hematology

## 2023-05-08 ENCOUNTER — Telehealth: Payer: Self-pay

## 2023-05-08 ENCOUNTER — Other Ambulatory Visit (HOSPITAL_COMMUNITY): Payer: Self-pay

## 2023-05-08 VITALS — BP 143/81 | HR 58 | Temp 96.6°F | Resp 16 | Wt 171.8 lb

## 2023-05-08 DIAGNOSIS — C61 Malignant neoplasm of prostate: Secondary | ICD-10-CM

## 2023-05-08 MED ORDER — DENOSUMAB 120 MG/1.7ML ~~LOC~~ SOLN
120.0000 mg | Freq: Once | SUBCUTANEOUS | Status: AC
  Administered 2023-05-08: 120 mg via SUBCUTANEOUS
  Filled 2023-05-08: qty 1.7

## 2023-05-08 MED ORDER — NUBEQA 300 MG PO TABS
600.0000 mg | ORAL_TABLET | Freq: Two times a day (BID) | ORAL | 1 refills | Status: DC
  Filled 2023-05-09: qty 120, 30d supply, fill #0
  Filled 2023-05-29: qty 120, 30d supply, fill #1

## 2023-05-08 NOTE — Telephone Encounter (Signed)
 Oral Oncology Patient Advocate Encounter  New authorization   Received notification that prior authorization for Nubeqa  is required.   PA submitted on 05/08/23  Key [id number]  Status is pending     Hansel Ley, CPhT Oncology Pharmacy Patient Advocate  [facility]  [phone] (phone) [phone] (fax) 05/08/2023 2:03 PM

## 2023-05-08 NOTE — Progress Notes (Signed)
 Carlos Harper presents today for injection per the provider's orders.  Xgeva  120 mg administration without incident; injection site WNL; see MAR for injection details.  Patient tolerated procedure well and without incident.  No questions or complaints noted at this time. Patient's Calcium noted to be 9.6 on 05/06/22.  Patient denies any tooth or jaw and recent or future dental appointments at this time. Patient reports taking Calcium/Vit D as directed.  Discharged from clinic ambulatory in stable condition. Alert and oriented x 3. F/U with [facility] as scheduled.

## 2023-05-08 NOTE — Patient Instructions (Addendum)
 [facility] Discharge Instructions   You were seen and examined today by Dr. Cheree Cords.  He reviewed the results of your lab work which are mostly normal/stable. Your PSA has gone up the last two times we've checked. This could mean the pills are not helping anymore. We will start you on a different pill called Nubeqa . You can stop taking the Zytiga .   Take the prednisone  every other day for 10 days and then stop. You do not have to take it with the new medication.  We will proceed with your Xgeva  injection today.   Return as scheduled.    Thank you for choosing  Cancer Center at [facility] to provide your oncology and hematology care.  To afford each patient quality time with our provider, please arrive at least 15 minutes before your scheduled appointment time.   If you have a lab appointment with the Cancer Center please come in thru the Main Entrance and check in at the main information desk.  You need to re-schedule your appointment should you arrive 10 or more minutes late.  We strive to give you quality time with our providers, and arriving late affects you and other patients whose appointments are after yours.  Also, if you no show three or more times for appointments you may be dismissed from the clinic at the providers discretion.     Again, thank you for choosing [facility].  Our hope is that these requests will decrease the amount of time that you wait before being seen by our physicians.       _____________________________________________________________  Should you have questions after your visit to [facility], please contact our office at [phone] and follow the prompts.  Our office hours are 8:00 a.m. and 4:30 p.m. Monday - Friday.  Please note that voicemails left after 4:00 p.m. may not be returned until the following business day.  We are closed weekends and major holidays.  You do  have access to a nurse 24-7, just call the main number to the clinic [phone] and do not press any options, hold on the line and a nurse will answer the phone.    For prescription refill requests, have your pharmacy contact our office and allow 72 hours.    Due to Covid, you will need to wear a mask upon entering the hospital. If you do not have a mask, a mask will be given to you at the Main Entrance upon arrival. For doctor visits, patients may have 1 support person age 22 or older with them. For treatment visits, patients can not have anyone with them due to social distancing guidelines and our immunocompromised population.
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
## 2023-05-08 NOTE — Telephone Encounter (Signed)
Clinical Pharmacist Practitioner Encounter   Received new prescription for Nubeqa (darolutamide) for the treatment of metastatic prostate cancer, planned duration until disease progression or unacceptable drug toxicity.  CMP from 05/07/23 assessed, no relevant lab abnormalities. Prescription dose and frequency assessed.   Current medication list in Epic reviewed, no DDIs with darolutamide identified.   Evaluated chart and no patient barriers to medication adherence identified.   Prescription has been e-scribed to the [facility] for benefits analysis and approval.  Oral Oncology Clinic will continue to follow for insurance authorization, copayment issues, initial counseling and start date.   Remi Haggard, PharmD, BCOP, CPP Hematology/Oncology Clinical Pharmacist [facility] Oral Chemotherapy Navigation [facility] [phone]  05/08/2023 1:05 PM

## 2023-05-09 ENCOUNTER — Other Ambulatory Visit: Payer: Self-pay

## 2023-05-09 ENCOUNTER — Other Ambulatory Visit (HOSPITAL_COMMUNITY): Payer: Self-pay

## 2023-05-09 NOTE — Telephone Encounter (Signed)
Clinical Pharmacist Practitioner Encounter   [facility] (Specialty) will deliver medication to patient on 05/13/23. Patient will get started when he has medication in hand.   Patient Education I spoke with patient's daughter Shellia Cleverly for overview of new oral chemotherapy medication:Nubeqa (darolutamide) for the treatment of metastatic prostate cancer, planned duration until disease progression or unacceptable drug toxicity.   Counseled [name] on administration, dosing, side effects, monitoring, drug-food interactions, safe handling, storage, and disposal. Patient will take 2 tablets (600 mg total) by mouth 2 (two) times daily with a meal.   Side effects include but not limited to: changes in liver function and decreased wbc.    Reviewed with [name] importance of keeping a medication schedule and plan for any missed doses.  After discussion with [name] no patient barriers to medication adherence identified.   [name] voiced understanding and appreciation. All questions answered. Medication handout provided.  Provided [name] with Oral Chemotherapy Navigation Clinic phone number. [name] knows to call the office with questions or concerns. Oral Chemotherapy Navigation Clinic will continue to follow.  Remi Haggard, PharmD, BCOP, CPP Hematology/Oncology Clinical Pharmacist [facility] Oral Chemotherapy [facility] [phone]  05/09/2023 2:17 PM

## 2023-05-09 NOTE — Progress Notes (Signed)
Specialty Pharmacy Initial Fill Coordination Note  Carlos Harper is a 83 y.o. male contacted today regarding initial fill of specialty medication(s) Darolutamide Merleen Nicely)  Patient requested Delivery   Delivery date: 05/13/23   Verified address: [address], [place], [place] [address]  Medication will be filled on 05/10/23.   Patient is aware of $4.00 copayment. Patient requests bill to [facility].    Ardeen Fillers, CPhT Oncology Pharmacy Patient Advocate  [facility]  [phone] (phone) [phone] (fax) 05/09/2023 2:30 PM

## 2023-05-09 NOTE — Telephone Encounter (Signed)
Oral Oncology Patient Advocate Encounter  Prior Authorization for Merleen Nicely has been approved.    PA# [id number]  Effective dates: 05/09/23 through 05/18/24  Patients co-pay is $4.00.    Ardeen Fillers, CPhT Oncology Pharmacy Patient Advocate  [facility]  [phone] (phone) [phone] (fax) 05/09/2023 1:13 PM

## 2023-05-09 NOTE — Progress Notes (Signed)
Patient education documented in EPIC note on 05/09/23.

## 2023-05-09 NOTE — Telephone Encounter (Signed)
Patient successfully OnBoarded and drug education provided by pharmacist. Medication scheduled to be shipped on Friday, 05/10/23, for delivery on Monday, 05/13/23, from [facility] to patient's address. Patient also knows to call me at [phone] with any questions or concerns regarding receiving medication or if there is any unexpected change in co-pay.    Ardeen Fillers, CPhT Oncology Pharmacy Patient Advocate  [facility]  [phone] (phone) [phone] (fax) 05/09/2023 2:32 PM

## 2023-05-16 ENCOUNTER — Other Ambulatory Visit: Payer: Self-pay

## 2023-05-28 ENCOUNTER — Encounter: Payer: Self-pay | Admitting: Genetic Counselor

## 2023-05-28 DIAGNOSIS — Z1379 Encounter for other screening for genetic and chromosomal anomalies: Secondary | ICD-10-CM | POA: Insufficient documentation

## 2023-05-29 ENCOUNTER — Other Ambulatory Visit: Payer: Self-pay

## 2023-05-29 NOTE — Progress Notes (Signed)
 Specialty Pharmacy Refill Coordination Note  Carlos Harper is a 83 y.o. male contacted today regarding refills of specialty medication(s) Darolutamide  (Nubeqa )   Patient requested Delivery   Delivery date: 06/05/23   Verified address: [address]   Medication will be filled on 06/04/23.

## 2023-05-29 NOTE — Progress Notes (Signed)
 Specialty Pharmacy Ongoing Clinical Assessment Note  Carlos Harper is a 83 y.o. 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   Therapeutic benefit summary: Unable to assess   Adverse events/side effects summary: No adverse events/side effects   Patient's therapy is appropriate to: Continue    Goals Addressed             This Visit's Progress    Stabilization of disease       Patient is unable to be assessed as therapy was recently initiated. Patient will be evaluated at upcoming provider appointment to assess progress         Follow up:  3 months  Mitzie GORMAN [facility] Special[facility] Pharmacist

## 2023-06-04 ENCOUNTER — Other Ambulatory Visit: Payer: Self-pay

## 2023-06-05 ENCOUNTER — Other Ambulatory Visit: Payer: Self-pay

## 2023-06-05 DIAGNOSIS — D500 Iron deficiency anemia secondary to blood loss (chronic): Secondary | ICD-10-CM

## 2023-06-05 DIAGNOSIS — C61 Malignant neoplasm of prostate: Secondary | ICD-10-CM
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
## 2023-06-06 ENCOUNTER — Inpatient Hospital Stay: Payer: Medicaid Other | Attending: Hematology

## 2023-06-06 ENCOUNTER — Inpatient Hospital Stay: Payer: Medicaid Other

## 2023-06-06 ENCOUNTER — Inpatient Hospital Stay (HOSPITAL_BASED_OUTPATIENT_CLINIC_OR_DEPARTMENT_OTHER): Payer: Medicaid Other | Admitting: Hematology

## 2023-06-06 VITALS — BP 142/89 | HR 65 | Temp 97.7°F | Resp 18 | Ht 67.72 in | Wt 172.0 lb

## 2023-06-06 DIAGNOSIS — D631 Anemia in chronic kidney disease: Secondary | ICD-10-CM | POA: Diagnosis not present

## 2023-06-06 DIAGNOSIS — N189 Chronic kidney disease, unspecified: Secondary | ICD-10-CM | POA: Diagnosis not present

## 2023-06-06 DIAGNOSIS — I129 Hypertensive chronic kidney disease with stage 1 through stage 4 chronic kidney disease, or unspecified chronic kidney disease: Secondary | ICD-10-CM | POA: Insufficient documentation

## 2023-06-06 DIAGNOSIS — G629 Polyneuropathy, unspecified: Secondary | ICD-10-CM | POA: Diagnosis not present

## 2023-06-06 DIAGNOSIS — C7951 Secondary malignant neoplasm of bone: Secondary | ICD-10-CM | POA: Diagnosis present

## 2023-06-06 DIAGNOSIS — C61 Malignant neoplasm of prostate: Secondary | ICD-10-CM

## 2023-06-06 DIAGNOSIS — D500 Iron deficiency anemia secondary to blood loss (chronic): Secondary | ICD-10-CM

## 2023-06-06 LAB — COMPREHENSIVE METABOLIC PANEL
ALT: 13 U/L (ref 0–44)
AST: 19 U/L (ref 15–41)
Albumin: 4.1 g/dL (ref 3.5–5.0)
Alkaline Phosphatase: 52 U/L (ref 38–126)
Anion gap: 10 (ref 5–15)
BUN: 22 mg/dL (ref 8–23)
CO2: 24 mmol/L (ref 22–32)
Calcium: 10 mg/dL (ref 8.9–10.3)
Chloride: 106 mmol/L (ref 98–111)
Creatinine, Ser: 1.44 mg/dL — ABNORMAL HIGH (ref 0.61–1.24)
GFR, Estimated: 48 mL/min — ABNORMAL LOW (ref >60)
Glucose, Bld: 89 mg/dL (ref 70–99)
Potassium: 3.8 mmol/L (ref 3.5–5.1)
Sodium: 140 mmol/L (ref 135–145)
Total Bilirubin: 1.1 mg/dL (ref 0.0–1.2)
Total Protein: 7.5 g/dL (ref 6.5–8.1)

## 2023-06-06 LAB — CBC WITH DIFFERENTIAL/PLATELET
Abs Immature Granulocytes: 0.01 10*3/uL (ref 0.00–0.07)
Basophils Absolute: 0 10*3/uL (ref 0.0–0.1)
Basophils Relative: 0 %
Eosinophils Absolute: 0 10*3/uL (ref 0.0–0.5)
Eosinophils Relative: 0 %
HCT: 36.7 % — ABNORMAL LOW (ref 39.0–52.0)
Hemoglobin: 12.4 g/dL — ABNORMAL LOW (ref 13.0–17.0)
Immature Granulocytes: 0 %
Lymphocytes Relative: 44 %
Lymphs Abs: 2.2 10*3/uL (ref 0.7–4.0)
MCH: 31.5 pg (ref 26.0–34.0)
MCHC: 33.8 g/dL (ref 30.0–36.0)
MCV: 93.1 fL (ref 80.0–100.0)
Monocytes Absolute: 0.4 10*3/uL (ref 0.1–1.0)
Monocytes Relative: 8 %
Neutro Abs: 2.4 10*3/uL (ref 1.7–7.7)
Neutrophils Relative %: 48 %
Platelets: 230 10*3/uL (ref 150–400)
RBC: 3.94 MIL/uL — ABNORMAL LOW (ref 4.22–5.81)
RDW: 13.2 % (ref 11.5–15.5)
WBC: 5 10*3/uL (ref 4.0–10.5)
nRBC: 0 % (ref 0.0–0.2)

## 2023-06-06 LAB — FERRITIN: Ferritin: 224 ng/mL (ref 24–336)

## 2023-06-06 LAB — IRON AND TIBC
Iron: 74 ug/dL (ref 45–182)
Saturation Ratios: 21 % (ref 17.9–39.5)
TIBC: 350 ug/dL (ref 250–450)
UIBC: 276 ug/dL

## 2023-06-06 LAB — PSA: Prostatic Specific Antigen: 0.54 ng/mL (ref 0.00–4.00)

## 2023-06-06 MED ORDER — GABAPENTIN 300 MG PO CAPS: 600.0000 mg | ORAL_CAPSULE | Freq: Every day | ORAL | 2 refills | Status: DC

## 2023-06-06 MED ORDER — DENOSUMAB 120 MG/1.7ML ~~LOC~~ SOLN
120.0000 mg | Freq: Once | SUBCUTANEOUS | Status: AC
  Administered 2023-06-06: 120 mg via SUBCUTANEOUS
  Filled 2023-06-06: qty 1.7

## 2023-06-06 NOTE — Patient Instructions (Signed)
[facility] [place] - A DEPT OF [facility]  Discharge Instructions: Thank you for choosing [facility] to provide your oncology and hematology care.  If you have a lab appointment with the Cancer Center - please note that after April 8th, 2024, all labs will be drawn in the cancer center.  You do not have to check in or register with the main entrance as you have in the past but will complete your check-in in the cancer center.  Wear comfortable clothing and clothing appropriate for easy access to any Portacath or PICC line.   We strive to give you quality time with your provider. You may need to reschedule your appointment if you arrive late (15 or more minutes).  Arriving late affects you and other patients whose appointments are after yours.  Also, if you miss three or more appointments without notifying the office, you may be dismissed from the clinic at the provider's discretion.      For prescription refill requests, have your pharmacy contact our office and allow 72 hours for refills to be completed.    Today you received the following:  Xgeva.  Denosumab Injection (Oncology) What is this medication? DENOSUMAB (den oh SUE mab) prevents weakened bones caused by cancer. It may also be used to treat noncancerous bone tumors that cannot be removed by surgery. It can also be used to treat high calcium levels in the blood caused by cancer. It works by blocking a protein that causes bones to break down quickly. This slows down the release of calcium from bones, which lowers calcium levels in your blood. It also makes your bones stronger and less likely to break (fracture). This medicine may be used for other purposes; ask your health care provider or pharmacist if you have questions. COMMON BRAND NAME(S): XGEVA What should I tell my care team before I take this medication? They need to know if you have any of these conditions: Dental disease Having surgery  or tooth extraction Infection Kidney disease Low levels of calcium or vitamin D in the blood Malnutrition On hemodialysis Skin conditions or sensitivity Thyroid or parathyroid disease An unusual reaction to denosumab, other medications, foods, dyes, or preservatives Pregnant or trying to get pregnant Breast-feeding How should I use this medication? This medication is for injection under the skin. It is given by your care team in a hospital or clinic setting. A special MedGuide will be given to you before each treatment. Be sure to read this information carefully each time. Talk to your care team about the use of this medication in children. While it may be prescribed for children as young as 13 years for selected conditions, precautions do apply. Overdosage: If you think you have taken too much of this medicine contact a poison control center or emergency room at once. NOTE: This medicine is only for you. Do not share this medicine with others. What if I miss a dose? Keep appointments for follow-up doses. It is important not to miss your dose. Call your care team if you are unable to keep an appointment. What may interact with this medication? Do not take this medication with any of the following: Other medications containing denosumab This medication may also interact with the following: Medications that lower your chance of fighting infection Steroid medications, such as prednisone or cortisone This list may not describe all possible interactions. Give your health care provider a list of all the medicines, herbs, non-prescription  drugs, or dietary supplements you use. Also tell them if you smoke, drink alcohol, or use illegal drugs. Some items may interact with your medicine. What should I watch for while using this medication? Your condition will be monitored carefully while you are receiving this medication. You may need blood work while taking this medication. This medication may  increase your risk of getting an infection. Call your care team for advice if you get a fever, chills, sore throat, or other symptoms of a cold or flu. Do not treat yourself. Try to avoid being around people who are sick. You should make sure you get enough calcium and vitamin D while you are taking this medication, unless your care team tells you not to. Discuss the foods you eat and the vitamins you take with your care team. Some people who take this medication have severe bone, joint, or muscle pain. This medication may also increase your risk for jaw problems or a broken thigh bone. Tell your care team right away if you have severe pain in your jaw, bones, joints, or muscles. Tell your care team if you have any pain that does not go away or that gets worse. Talk to your care team if you may be pregnant. Serious birth defects can occur if you take this medication during pregnancy and for 5 months after the last dose. You will need a negative pregnancy test before starting this medication. Contraception is recommended while taking this medication and for 5 months after the last dose. Your care team can help you find the option that works for you. What side effects may I notice from receiving this medication? Side effects that you should report to your care team as soon as possible: Allergic reactions--skin rash, itching, hives, swelling of the face, lips, tongue, or throat Bone, joint, or muscle pain Low calcium level--muscle pain or cramps, confusion, tingling, or numbness in the hands or feet Osteonecrosis of the jaw--pain, swelling, or redness in the mouth, numbness of the jaw, poor healing after dental work, unusual discharge from the mouth, visible bones in the mouth Side effects that usually do not require medical attention (report to your care team if they continue or are bothersome): Cough Diarrhea Fatigue Headache Nausea This list may not describe all possible side effects. Call your  doctor for medical advice about side effects. You may report side effects to FDA at 1-800-FDA-1088. Where should I keep my medication? This medication is given in a hospital or clinic. It will not be stored at home. NOTE: This sheet is a summary. It may not cover all possible information. If you have questions about this medicine, talk to your doctor, pharmacist, or health care provider.  2024 Elsevier/Gold Standard (2021-08-30 00:00:00)     To help prevent nausea and vomiting after your treatment, we encourage you to take your nausea medication as directed.  BELOW ARE SYMPTOMS THAT SHOULD BE REPORTED IMMEDIATELY: *FEVER GREATER THAN 100.4 F (38 C) OR HIGHER *CHILLS OR SWEATING *NAUSEA AND VOMITING THAT IS NOT CONTROLLED WITH YOUR NAUSEA MEDICATION *UNUSUAL SHORTNESS OF BREATH *UNUSUAL BRUISING OR BLEEDING *URINARY PROBLEMS (pain or burning when urinating, or frequent urination) *BOWEL PROBLEMS (unusual diarrhea, constipation, pain near the anus) TENDERNESS IN MOUTH AND THROAT WITH OR WITHOUT PRESENCE OF ULCERS (sore throat, sores in mouth, or a toothache) UNUSUAL RASH, SWELLING OR PAIN  UNUSUAL VAGINAL DISCHARGE OR ITCHING   Items with * indicate a potential emergency and should be followed up as soon as possible or  go to the Emergency Department if any problems should occur.  Please show the CHEMOTHERAPY ALERT CARD or IMMUNOTHERAPY ALERT CARD at check-in to the Emergency Department and triage nurse.  Should you have questions after your visit or need to cancel or reschedule your appointment, please contact [facility] [place] - A DEPT OF Eligha Brideg[facility] [phone]  and follow the prompts.  Office hours are 8:00 a.m. to 4:30 p.m. Monday - Friday. Please note that voicemails left after 4:00 p.m. may not be returned until the following business day.  We are closed weekends and major holidays. You have access to a nurse at all times for urgent questions. Please call  the main number to the clinic [phone] and follow the prompts.  For any non-urgent questions, you may also contact your provider using MyChart. We now offer e-Visits for anyone 91 and older to request care online for non-urgent symptoms. For details visit [email or web address].   Also download the MyChart app! Go to the app store, search "MyChart", open the app, select [place], and log in with your MyChart username and password.

## 2023-06-06 NOTE — Patient Instructions (Signed)
[facility] at [facility] Discharge Instructions   You were seen and examined today by Dr. Ellin Saba.  He reviewed the results of your lab work which are normal/stable.  Continue Nubeqa (prostate cancer pill) as prescribed.    We will see you back in 4 weeks. We will repeat lab work at that time.    Return as scheduled.    Thank you for choosing [facility] at [facility] to provide your oncology and hematology care.  To afford each patient quality time with our provider, please arrive at least 15 minutes before your scheduled appointment time.   If you have a lab appointment with the Cancer Center please come in thru the Main Entrance and check in at the main information desk.  You need to re-schedule your appointment should you arrive 10 or more minutes late.  We strive to give you quality time with our providers, and arriving late affects you and other patients whose appointments are after yours.  Also, if you no show three or more times for appointments you may be dismissed from the clinic at the providers discretion.     Again, thank you for choosing [facility].  Our hope is that these requests will decrease the amount of time that you wait before being seen by our physicians.       _____________________________________________________________  Should you have questions after your visit to [facility], please contact our office at [phone] and follow the prompts.  Our office hours are 8:00 a.m. and 4:30 p.m. Monday - Friday.  Please note that voicemails left after 4:00 p.m. may not be returned until the following business day.  We are closed weekends and major holidays.  You do have access to a nurse 24-7, just call the main number to the clinic [phone] and do not press any options, hold on the line and a nurse will answer the phone.    For prescription refill requests, have your pharmacy contact our  office and allow 72 hours.    Due to Covid, you will need to wear a mask upon entering the hospital. If you do not have a mask, a mask will be given to you at the Main Entrance upon arrival. For doctor visits, patients may have 1 support person age 56 or older with them. For treatment visits, patients can not have anyone with them due to social distancing guidelines and our immunocompromised population.

## 2023-06-06 NOTE — Progress Notes (Signed)
Patient tolerated Xgeva injection with no complaints voiced.  Site clean and dry with no bruising or swelling noted.  No complaints of pain.  Discharged with vital signs stable and no signs or symptoms of distress noted.

## 2023-06-17 ENCOUNTER — Other Ambulatory Visit: Payer: Self-pay

## 2023-06-25 ENCOUNTER — Other Ambulatory Visit (HOSPITAL_COMMUNITY): Payer: Self-pay

## 2023-06-25 ENCOUNTER — Other Ambulatory Visit: Payer: Self-pay

## 2023-06-25 ENCOUNTER — Other Ambulatory Visit: Payer: Self-pay | Admitting: Hematology

## 2023-06-25 MED ORDER — NUBEQA 300 MG PO TABS
600.0000 mg | ORAL_TABLET | Freq: Two times a day (BID) | ORAL | 1 refills | Status: DC
  Filled 2023-06-25: qty 120, 30d supply, fill #0
  Filled 2023-07-22 (×2): qty 120, 30d supply, fill #1

## 2023-06-25 NOTE — Progress Notes (Signed)
 Specialty Pharmacy Refill Coordination Note  Carlos Harper is a 83 y.o. male contacted today regarding refills of specialty medication(s) No data recorded  Patient requested (Patient-Rptd) Delivery   Delivery date: (Patient-Rptd) 07/01/23   Verified address: (Patient-Rptd) [address] [place], [place] [address]   Medication will be filled on 06/28/23.   This fill date is pending response to refill request from provider. Patient is aware and if they have not received fill by intended date, they must follow up with pharmacy.

## 2023-06-27 ENCOUNTER — Other Ambulatory Visit: Payer: Self-pay

## 2023-06-28 ENCOUNTER — Other Ambulatory Visit: Payer: Self-pay

## 2023-07-03 ENCOUNTER — Other Ambulatory Visit: Payer: Self-pay

## 2023-07-03 DIAGNOSIS — C61 Malignant neoplasm of prostate: Secondary | ICD-10-CM

## 2023-07-03 DIAGNOSIS — D500 Iron deficiency anemia secondary to blood loss (chronic): Secondary | ICD-10-CM

## 2023-07-04 ENCOUNTER — Inpatient Hospital Stay: Payer: Medicaid Other | Attending: Hematology

## 2023-07-04 ENCOUNTER — Inpatient Hospital Stay: Payer: Medicaid Other

## 2023-07-04 ENCOUNTER — Inpatient Hospital Stay (HOSPITAL_BASED_OUTPATIENT_CLINIC_OR_DEPARTMENT_OTHER): Payer: Medicaid Other | Admitting: Hematology

## 2023-07-04 VITALS — BP 130/80 | HR 64 | Temp 97.4°F | Resp 18 | Wt 172.4 lb

## 2023-07-04 DIAGNOSIS — C61 Malignant neoplasm of prostate: Secondary | ICD-10-CM

## 2023-07-04 DIAGNOSIS — C7951 Secondary malignant neoplasm of bone: Secondary | ICD-10-CM | POA: Diagnosis not present

## 2023-07-04 DIAGNOSIS — N189 Chronic kidney disease, unspecified: Secondary | ICD-10-CM | POA: Diagnosis not present

## 2023-07-04 DIAGNOSIS — G629 Polyneuropathy, unspecified: Secondary | ICD-10-CM | POA: Diagnosis not present

## 2023-07-04 DIAGNOSIS — I129 Hypertensive chronic kidney disease with stage 1 through stage 4 chronic kidney disease, or unspecified chronic kidney disease: Secondary | ICD-10-CM | POA: Insufficient documentation

## 2023-07-04 DIAGNOSIS — D631 Anemia in chronic kidney disease: Secondary | ICD-10-CM | POA: Insufficient documentation

## 2023-07-04 DIAGNOSIS — D500 Iron deficiency anemia secondary to blood loss (chronic): Secondary | ICD-10-CM

## 2023-07-04 LAB — CBC WITH DIFFERENTIAL/PLATELET
Abs Immature Granulocytes: 0.01 10*3/uL (ref 0.00–0.07)
Basophils Absolute: 0 10*3/uL (ref 0.0–0.1)
Basophils Relative: 1 %
Eosinophils Absolute: 0 10*3/uL (ref 0.0–0.5)
Eosinophils Relative: 1 %
HCT: 37.6 % — ABNORMAL LOW (ref 39.0–52.0)
Hemoglobin: 12.4 g/dL — ABNORMAL LOW (ref 13.0–17.0)
Immature Granulocytes: 0 %
Lymphocytes Relative: 43 %
Lymphs Abs: 1.9 10*3/uL (ref 0.7–4.0)
MCH: 30.5 pg (ref 26.0–34.0)
MCHC: 33 g/dL (ref 30.0–36.0)
MCV: 92.6 fL (ref 80.0–100.0)
Monocytes Absolute: 0.4 10*3/uL (ref 0.1–1.0)
Monocytes Relative: 8 %
Neutro Abs: 2.1 10*3/uL (ref 1.7–7.7)
Neutrophils Relative %: 47 %
Platelets: 211 10*3/uL (ref 150–400)
RBC: 4.06 MIL/uL — ABNORMAL LOW (ref 4.22–5.81)
RDW: 13.1 % (ref 11.5–15.5)
WBC: 4.4 10*3/uL (ref 4.0–10.5)
nRBC: 0 % (ref 0.0–0.2)

## 2023-07-04 LAB — COMPREHENSIVE METABOLIC PANEL
ALT: 14 U/L (ref 0–44)
AST: 18 U/L (ref 15–41)
Albumin: 4.2 g/dL (ref 3.5–5.0)
Alkaline Phosphatase: 53 U/L (ref 38–126)
Anion gap: 11 (ref 5–15)
BUN: 17 mg/dL (ref 8–23)
CO2: 23 mmol/L (ref 22–32)
Calcium: 9.9 mg/dL (ref 8.9–10.3)
Chloride: 106 mmol/L (ref 98–111)
Creatinine, Ser: 1.5 mg/dL — ABNORMAL HIGH (ref 0.61–1.24)
GFR, Estimated: 46 mL/min — ABNORMAL LOW (ref >60)
Glucose, Bld: 78 mg/dL (ref 70–99)
Potassium: 3.9 mmol/L (ref 3.5–5.1)
Sodium: 140 mmol/L (ref 135–145)
Total Bilirubin: 1.1 mg/dL (ref 0.0–1.2)
Total Protein: 7.7 g/dL (ref 6.5–8.1)

## 2023-07-04 LAB — IRON AND TIBC
Iron: 67 ug/dL (ref 45–182)
Saturation Ratios: 21 % (ref 17.9–39.5)
TIBC: 327 ug/dL (ref 250–450)
UIBC: 260 ug/dL

## 2023-07-04 LAB — FERRITIN: Ferritin: 249 ng/mL (ref 24–336)

## 2023-07-04 LAB — PSA: Prostatic Specific Antigen: 0.75 ng/mL (ref 0.00–4.00)

## 2023-07-04 MED ORDER — DENOSUMAB 120 MG/1.7ML ~~LOC~~ SOLN
120.0000 mg | Freq: Once | SUBCUTANEOUS | Status: AC
  Administered 2023-07-04: 120 mg via SUBCUTANEOUS
  Filled 2023-07-04: qty 1.7
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
## 2023-07-04 NOTE — Progress Notes (Signed)
 Patient is taking Nubeqa as prescribed. He has not missed any doses and reports no side effects at this time.

## 2023-07-04 NOTE — Patient Instructions (Signed)
 [facility] at [facility] Discharge Instructions   You were seen and examined today by Dr. Ellin Saba.  He reviewed the results of your lab work which are normal/stable.   Continue Nubeqa as prescribed.   We will proceed with your injection today.   Return as scheduled.    Thank you for choosing [facility] at [facility] to provide your oncology and hematology care.  To afford each patient quality time with our provider, please arrive at least 15 minutes before your scheduled appointment time.   If you have a lab appointment with the Cancer Center please come in thru the Main Entrance and check in at the main information desk.  You need to re-schedule your appointment should you arrive 10 or more minutes late.  We strive to give you quality time with our providers, and arriving late affects you and other patients whose appointments are after yours.  Also, if you no show three or more times for appointments you may be dismissed from the clinic at the providers discretion.     Again, thank you for choosing [facility].  Our hope is that these requests will decrease the amount of time that you wait before being seen by our physicians.       _____________________________________________________________  Should you have questions after your visit to [facility], please contact our office at [phone] and follow the prompts.  Our office hours are 8:00 a.m. and 4:30 p.m. Monday - Friday.  Please note that voicemails left after 4:00 p.m. may not be returned until the following business day.  We are closed weekends and major holidays.  You do have access to a nurse 24-7, just call the main number to the clinic [phone] and do not press any options, hold on the line and a nurse will answer the phone.    For prescription refill requests, have your pharmacy contact our office and allow 72 hours.    Due to Covid, you will  need to wear a mask upon entering the hospital. If you do not have a mask, a mask will be given to you at the Main Entrance upon arrival. For doctor visits, patients may have 1 support person age 35 or older with them. For treatment visits, patients can not have anyone with them due to social distancing guidelines and our immunocompromised population.

## 2023-07-04 NOTE — Progress Notes (Signed)
 Patient tolerated injection with no complaints voiced.  Site clean and dry with no bruising or swelling noted at site.  See MAR for details.  Band aid applied.  Patient stable during and after injection.  Vss with discharge and left in satisfactory condition with no s/s of distress noted.

## 2023-07-04 NOTE — Patient Instructions (Signed)
 [facility] [place] - A DEPT OF [facility]  Discharge Instructions: Thank you for choosing [facility] to provide your oncology and hematology care.  If you have a lab appointment with the Cancer Center - please note that after April 8th, 2024, all labs will be drawn in the cancer center.  You do not have to check in or register with the main entrance as you have in the past but will complete your check-in in the cancer center.  Wear comfortable clothing and clothing appropriate for easy access to any Portacath or PICC line.   We strive to give you quality time with your provider. You may need to reschedule your appointment if you arrive late (15 or more minutes).  Arriving late affects you and other patients whose appointments are after yours.  Also, if you miss three or more appointments without notifying the office, you may be dismissed from the clinic at the provider's discretion.      For prescription refill requests, have your pharmacy contact our office and allow 72 hours for refills to be completed.    Today you received the following chemotherapy and/or immunotherapy agents xgeva      To help prevent nausea and vomiting after your treatment, we encourage you to take your nausea medication as directed.  BELOW ARE SYMPTOMS THAT SHOULD BE REPORTED IMMEDIATELY: *FEVER GREATER THAN 100.4 F (38 C) OR HIGHER *CHILLS OR SWEATING *NAUSEA AND VOMITING THAT IS NOT CONTROLLED WITH YOUR NAUSEA MEDICATION *UNUSUAL SHORTNESS OF BREATH *UNUSUAL BRUISING OR BLEEDING *URINARY PROBLEMS (pain or burning when urinating, or frequent urination) *BOWEL PROBLEMS (unusual diarrhea, constipation, pain near the anus) TENDERNESS IN MOUTH AND THROAT WITH OR WITHOUT PRESENCE OF ULCERS (sore throat, sores in mouth, or a toothache) UNUSUAL RASH, SWELLING OR PAIN  UNUSUAL VAGINAL DISCHARGE OR ITCHING   Items with * indicate a potential emergency and should be followed up as  soon as possible or go to the Emergency Department if any problems should occur.  Please show the CHEMOTHERAPY ALERT CARD or IMMUNOTHERAPY ALERT CARD at check-in to the Emergency Department and triage nurse.  Should you have questions after your visit or need to cancel or reschedule your appointment, please contact [facility] [place] - A DEPT OF [facility] [phone]  and follow the prompts.  Office hours are 8:00 a.m. to 4:30 p.m. Monday - Friday. Please note that voicemails left after 4:00 p.m. may not be returned until the following business day.  We are closed weekends and major holidays. You have access to a nurse at all times for urgent questions. Please call the main number to the clinic [phone] and follow the prompts.  For any non-urgent questions, you may also contact your provider using MyChart. We now offer e-Visits for anyone 51 and older to request care online for non-urgent symptoms. For details visit [email or web address].   Also download the MyChart app! Go to the app store, search "MyChart", open the app, select [place], and log in with your MyChart username and password.

## 2023-07-22 ENCOUNTER — Other Ambulatory Visit (HOSPITAL_COMMUNITY): Payer: Self-pay

## 2023-07-22 ENCOUNTER — Other Ambulatory Visit: Payer: Self-pay

## 2023-07-22 NOTE — Progress Notes (Signed)
 Specialty Pharmacy Refill Coordination Note  Carlos Harper is a 83 y.o. male contacted today regarding refills of specialty medication(s) No data recorded  Patient requested (Patient-Rptd) Delivery   Delivery date: (Patient-Rptd) 08/01/23   Verified address: (Patient-Rptd) [address] [place], [place] [address]   Medication will be filled on 07/31/23.

## 2023-07-28 ENCOUNTER — Other Ambulatory Visit: Payer: Self-pay | Admitting: Family Medicine

## 2023-07-31 ENCOUNTER — Other Ambulatory Visit: Payer: Self-pay

## 2023-07-31 DIAGNOSIS — C61 Malignant neoplasm of prostate: Secondary | ICD-10-CM

## 2023-08-01 ENCOUNTER — Inpatient Hospital Stay: Attending: Hematology

## 2023-08-01 ENCOUNTER — Inpatient Hospital Stay

## 2023-08-01 ENCOUNTER — Other Ambulatory Visit: Payer: Self-pay

## 2023-08-01 VITALS — BP 144/82 | HR 66 | Temp 96.6°F | Resp 18

## 2023-08-01 DIAGNOSIS — C61 Malignant neoplasm of prostate: Secondary | ICD-10-CM

## 2023-08-01 DIAGNOSIS — C7951 Secondary malignant neoplasm of bone: Secondary | ICD-10-CM | POA: Insufficient documentation

## 2023-08-01 LAB — COMPREHENSIVE METABOLIC PANEL WITH GFR
ALT: 13 U/L (ref 0–44)
AST: 18 U/L (ref 15–41)
Albumin: 3.7 g/dL (ref 3.5–5.0)
Alkaline Phosphatase: 53 U/L (ref 38–126)
Anion gap: 10 (ref 5–15)
BUN: 19 mg/dL (ref 8–23)
CO2: 21 mmol/L — ABNORMAL LOW (ref 22–32)
Calcium: 9.4 mg/dL (ref 8.9–10.3)
Chloride: 106 mmol/L (ref 98–111)
Creatinine, Ser: 1.44 mg/dL — ABNORMAL HIGH (ref 0.61–1.24)
GFR, Estimated: 48 mL/min — ABNORMAL LOW (ref >60)
Glucose, Bld: 104 mg/dL — ABNORMAL HIGH (ref 70–99)
Potassium: 4.1 mmol/L (ref 3.5–5.1)
Sodium: 137 mmol/L (ref 135–145)
Total Bilirubin: 1.1 mg/dL (ref 0.0–1.2)
Total Protein: 7 g/dL (ref 6.5–8.1)

## 2023-08-01 MED ORDER — DENOSUMAB 120 MG/1.7ML ~~LOC~~ SOLN
120.0000 mg | Freq: Once | SUBCUTANEOUS | Status: AC
Start: 2023-08-01 — End: 2023-08-01
  Administered 2023-08-01: 120 mg via SUBCUTANEOUS
  Filled 2023-08-01: qty 1.7

## 2023-08-01 NOTE — Patient Instructions (Signed)
 [facility] [place] - A DEPT OF [facility]  Discharge Instructions: Thank you for choosing [facility] to provide your oncology and hematology care.  If you have a lab appointment with the Cancer Center - please note that after April 8th, 2024, all labs will be drawn in the cancer center.  You do not have to check in or register with the main entrance as you have in the past but will complete your check-in in the cancer center.  Wear comfortable clothing and clothing appropriate for easy access to any Portacath or PICC line.   We strive to give you quality time with your provider. You may need to reschedule your appointment if you arrive late (15 or more minutes).  Arriving late affects you and other patients whose appointments are after yours.  Also, if you miss three or more appointments without notifying the office, you may be dismissed from the clinic at the provider's discretion.      For prescription refill requests, have your pharmacy contact our office and allow 72 hours for refills to be completed.    Today you received the following chemotherapy and/or immunotherapy agents Xgeva      To help prevent nausea and vomiting after your treatment, we encourage you to take your nausea medication as directed.  BELOW ARE SYMPTOMS THAT SHOULD BE REPORTED IMMEDIATELY: *FEVER GREATER THAN 100.4 F (38 C) OR HIGHER *CHILLS OR SWEATING *NAUSEA AND VOMITING THAT IS NOT CONTROLLED WITH YOUR NAUSEA MEDICATION *UNUSUAL SHORTNESS OF BREATH *UNUSUAL BRUISING OR BLEEDING *URINARY PROBLEMS (pain or burning when urinating, or frequent urination) *BOWEL PROBLEMS (unusual diarrhea, constipation, pain near the anus) TENDERNESS IN MOUTH AND THROAT WITH OR WITHOUT PRESENCE OF ULCERS (sore throat, sores in mouth, or a toothache) UNUSUAL RASH, SWELLING OR PAIN  UNUSUAL VAGINAL DISCHARGE OR ITCHING   Items with * indicate a potential emergency and should be followed up as  soon as possible or go to the Emergency Department if any problems should occur.  Please show the CHEMOTHERAPY ALERT CARD or IMMUNOTHERAPY ALERT CARD at check-in to the Emergency Department and triage nurse.  Should you have questions after your visit or need to cancel or reschedule your appointment, please contact [facility] [place] - A DEPT OF Eligha Bridegroom [facility] [phone]  and follow the prompts.  Office hours are 8:00 a.m. to 4:30 p.m. Monday - Friday. Please note that voicemails left after 4:00 p.m. may not be returned until the following business day.  We are closed weekends and major holidays. You have access to a nurse at all times for urgent questions. Please call the main number to the clinic [phone] and follow the prompts.  For any non-urgent questions, you may also contact your provider using MyChart. We now offer e-Visits for anyone 65 and older to request care online for non-urgent symptoms. For details visit [email or web address].   Also download the MyChart app! Go to the app store, search "MyChart", open the app, select [place], and log in with your MyChart username and password.

## 2023-08-01 NOTE — Progress Notes (Signed)
 Patient presents today for Xgeva injection per providers order.  Vital signs WNL.  Calcium is 9.4.  Patient has been taking Calcium/vitamin D supplements, has had no jaw pain, and no prior or upcoming dental work. Stable during administration without incident; injection site WNL; see MAR for injection details.  Patient tolerated procedure well and without incident.  No questions or complaints noted at this time.

## 2023-08-20 ENCOUNTER — Other Ambulatory Visit: Payer: Self-pay

## 2023-08-21 DIAGNOSIS — N261 Atrophy of kidney (terminal): Secondary | ICD-10-CM | POA: Diagnosis not present

## 2023-08-21 DIAGNOSIS — N1831 Chronic kidney disease, stage 3a: Secondary | ICD-10-CM | POA: Diagnosis not present

## 2023-08-21 DIAGNOSIS — I129 Hypertensive chronic kidney disease with stage 1 through stage 4 chronic kidney disease, or unspecified chronic kidney disease: Secondary | ICD-10-CM | POA: Diagnosis not present

## 2023-08-21 DIAGNOSIS — C61 Malignant neoplasm of prostate: Secondary | ICD-10-CM | POA: Diagnosis not present

## 2023-08-26 ENCOUNTER — Other Ambulatory Visit: Payer: Self-pay

## 2023-08-26 ENCOUNTER — Other Ambulatory Visit: Payer: Self-pay | Admitting: Hematology

## 2023-08-26 ENCOUNTER — Other Ambulatory Visit (HOSPITAL_COMMUNITY): Payer: Self-pay

## 2023-08-26 MED ORDER — NUBEQA 300 MG PO TABS
600.0000 mg | ORAL_TABLET | Freq: Two times a day (BID) | ORAL | 1 refills | Status: DC
  Filled 2023-08-26: qty 120, 30d supply, fill #0
  Filled 2023-09-18: qty 120, 30d supply, fill #1

## 2023-08-26 NOTE — Progress Notes (Addendum)
 Specialty Pharmacy Refill Coordination Note  I spoke with patient's daughter. Adilson Vannort is a 83 y.o. male contacted today regarding refills of specialty medication(s) Darolutamide  (Nubeqa )   Patient requested Delivery   Delivery date: 08/30/23   Verified address: [address] [place], [place] [address]   Medication will be filled on 08/29/23. This fill date is pending response to refill request from provider. Patient is aware and if they have not received fill by intended date they must follow up with pharmacy.

## 2023-08-26 NOTE — Progress Notes (Signed)
 Specialty Pharmacy Ongoing Clinical Assessment Note  I spoke with patient's daughter. Lay Gehres is a 83 y.o. 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   Therapeutic benefit summary: Unable to assess   Adverse events/side effects summary: No adverse events/side effects   Patient's therapy is appropriate to: Continue    Goals Addressed             This Visit's Progress    Slow Disease Progression       Patient is not on track and worsening. Patient will maintain adherence, adhere to provider and/or lab appointments, and be monitored by provider to determine if a change in treatment plan is warranted. Patient's PSA continues to rise slowly and was most recently 0.75 ng/mL as of 07/04/23.  Dr. Katragadda is closely monitoring and will adjust therapy as needed.          Follow up:  3 months  Malachi Screws Specialty Pharmacist

## 2023-08-28 ENCOUNTER — Other Ambulatory Visit: Payer: Self-pay

## 2023-08-28 DIAGNOSIS — C61 Malignant neoplasm of prostate: Secondary | ICD-10-CM

## 2023-08-29 ENCOUNTER — Other Ambulatory Visit: Payer: Self-pay

## 2023-08-29 ENCOUNTER — Inpatient Hospital Stay (HOSPITAL_BASED_OUTPATIENT_CLINIC_OR_DEPARTMENT_OTHER)

## 2023-08-29 ENCOUNTER — Inpatient Hospital Stay: Attending: Hematology

## 2023-08-29 VITALS — BP 132/72 | HR 56 | Temp 97.1°F | Resp 18

## 2023-08-29 DIAGNOSIS — C61 Malignant neoplasm of prostate: Secondary | ICD-10-CM

## 2023-08-29 DIAGNOSIS — C7951 Secondary malignant neoplasm of bone: Secondary | ICD-10-CM | POA: Diagnosis present

## 2023-08-29 LAB — COMPREHENSIVE METABOLIC PANEL WITH GFR
ALT: 13 U/L (ref 0–44)
AST: 20 U/L (ref 15–41)
Albumin: 3.8 g/dL (ref 3.5–5.0)
Alkaline Phosphatase: 49 U/L (ref 38–126)
Anion gap: 9 (ref 5–15)
BUN: 21 mg/dL (ref 8–23)
CO2: 21 mmol/L — ABNORMAL LOW (ref 22–32)
Calcium: 9.1 mg/dL (ref 8.9–10.3)
Chloride: 107 mmol/L (ref 98–111)
Creatinine, Ser: 1.54 mg/dL — ABNORMAL HIGH (ref 0.61–1.24)
GFR, Estimated: 44 mL/min — ABNORMAL LOW (ref >60)
Glucose, Bld: 91 mg/dL (ref 70–99)
Potassium: 4.3 mmol/L (ref 3.5–5.1)
Sodium: 137 mmol/L (ref 135–145)
Total Bilirubin: 1.1 mg/dL (ref 0.0–1.2)
Total Protein: 7.1 g/dL (ref 6.5–8.1)

## 2023-08-29 MED ORDER — DENOSUMAB 120 MG/1.7ML ~~LOC~~ SOLN
120.0000 mg | Freq: Once | SUBCUTANEOUS | Status: AC
Start: 2023-08-29 — End: 2023-08-29
  Administered 2023-08-29: 120 mg via SUBCUTANEOUS
  Filled 2023-08-29: qty 1.7

## 2023-08-29 NOTE — Patient Instructions (Signed)
 [facility] [place] - A DEPT OF [facility]  Discharge Instructions: Thank you for choosing [facility] to provide your oncology and hematology care.  If you have a lab appointment with the Cancer Center - please note that after April 8th, 2024, all labs will be drawn in the cancer center.  You do not have to check in or register with the main entrance as you have in the past but will complete your check-in in the cancer center.  Wear comfortable clothing and clothing appropriate for easy access to any Portacath or PICC line.   We strive to give you quality time with your provider. You may need to reschedule your appointment if you arrive late (15 or more minutes).  Arriving late affects you and other patients whose appointments are after yours.  Also, if you miss three or more appointments without notifying the office, you may be dismissed from the clinic at the provider's discretion.      For prescription refill requests, have your pharmacy contact our office and allow 72 hours for refills to be completed.    Today you received Xgeva  120 mg   BELOW ARE SYMPTOMS THAT SHOULD BE REPORTED IMMEDIATELY: *FEVER GREATER THAN 100.4 F (38 C) OR HIGHER *CHILLS OR SWEATING *NAUSEA AND VOMITING THAT IS NOT CONTROLLED WITH YOUR NAUSEA MEDICATION *UNUSUAL SHORTNESS OF BREATH *UNUSUAL BRUISING OR BLEEDING *URINARY PROBLEMS (pain or burning when urinating, or frequent urination) *BOWEL PROBLEMS (unusual diarrhea, constipation, pain near the anus) TENDERNESS IN MOUTH AND THROAT WITH OR WITHOUT PRESENCE OF ULCERS (sore throat, sores in mouth, or a toothache) UNUSUAL RASH, SWELLING OR PAIN  UNUSUAL VAGINAL DISCHARGE OR ITCHING   Items with * indicate a potential emergency and should be followed up as soon as possible or go to the Emergency Department if any problems should occur.  Please show the CHEMOTHERAPY ALERT CARD or IMMUNOTHERAPY ALERT CARD at check-in to the  Emergency Department and triage nurse.  Should you have questions after your visit or need to cancel or reschedule your appointment, please contact [facility]CER [facility] [place] - A DEPT OF [facility] [phone]  and follow the prompts.  Office hours are 8:00 a.m. to 4:30 p.m. Monday - Friday. Please note that voicemails left after 4:00 p.m. may not be returned until the following business day.  We are closed weekends and major holidays. You have access to a nurse at all times for urgent questions. Please call the main number to the clinic [phone] and follow the prompts.  For any non-urgent questions, you may also contact your provider using MyChart. We now offer e-Visits for anyone 55 and older to request care online for non-urgent symptoms. For details visit [email or web address].   Also download the MyChart app! Go to the app store, search "MyChart", open the app, select [place], and log in with your MyChart username and password.

## 2023-08-29 NOTE — Progress Notes (Signed)
 Carlos Harper presents today for injection per the provider's orders. Xgeva  120 mg administration without incident; injection site WNL; see MAR for injection details.  Patient tolerated procedure well and without incident.  No questions or complaints noted at this time.   Discharged from clinic ambulatory in stable condition. Alert and oriented x 3. F/U with [facility] as scheduled.

## 2023-09-18 ENCOUNTER — Other Ambulatory Visit: Payer: Self-pay

## 2023-09-18 ENCOUNTER — Other Ambulatory Visit (HOSPITAL_COMMUNITY): Payer: Self-pay

## 2023-09-18 NOTE — Progress Notes (Signed)
 Specialty Pharmacy Refill Coordination Note  Carlos Harper is a 83 y.o. male contacted today regarding refills of specialty medication(s) Nubeqa .  Patient requested (Patient-Rptd) Delivery   Delivery date: (Patient-Rptd) 10/01/23   Verified address: (Patient-Rptd) [address] [place], [place] [address]   Medication will be filled on 09/30/23.

## 2023-09-25 ENCOUNTER — Other Ambulatory Visit: Payer: Self-pay

## 2023-09-25 ENCOUNTER — Other Ambulatory Visit: Payer: Self-pay | Admitting: Family Medicine

## 2023-09-25 DIAGNOSIS — C61 Malignant neoplasm of prostate: Secondary | ICD-10-CM

## 2023-09-26 ENCOUNTER — Inpatient Hospital Stay: Attending: Hematology | Admitting: Hematology

## 2023-09-26 ENCOUNTER — Inpatient Hospital Stay

## 2023-09-26 ENCOUNTER — Other Ambulatory Visit: Payer: Self-pay

## 2023-09-26 VITALS — BP 134/74 | HR 54 | Temp 97.9°F | Resp 16 | Wt 166.9 lb

## 2023-09-26 DIAGNOSIS — D631 Anemia in chronic kidney disease: Secondary | ICD-10-CM | POA: Insufficient documentation

## 2023-09-26 DIAGNOSIS — D500 Iron deficiency anemia secondary to blood loss (chronic): Secondary | ICD-10-CM

## 2023-09-26 DIAGNOSIS — C61 Malignant neoplasm of prostate: Secondary | ICD-10-CM | POA: Diagnosis not present

## 2023-09-26 DIAGNOSIS — G629 Polyneuropathy, unspecified: Secondary | ICD-10-CM | POA: Diagnosis not present

## 2023-09-26 DIAGNOSIS — C7951 Secondary malignant neoplasm of bone: Secondary | ICD-10-CM | POA: Diagnosis present

## 2023-09-26 DIAGNOSIS — N189 Chronic kidney disease, unspecified: Secondary | ICD-10-CM | POA: Diagnosis not present

## 2023-09-26 DIAGNOSIS — I129 Hypertensive chronic kidney disease with stage 1 through stage 4 chronic kidney disease, or unspecified chronic kidney disease: Secondary | ICD-10-CM | POA: Insufficient documentation

## 2023-09-26 LAB — IRON AND TIBC
Iron: 55 ug/dL (ref 45–182)
Saturation Ratios: 18 % (ref 17.9–39.5)
TIBC: 312 ug/dL (ref 250–450)
UIBC: 257 ug/dL

## 2023-09-26 LAB — CBC WITH DIFFERENTIAL/PLATELET
Abs Immature Granulocytes: 0.01 10*3/uL (ref 0.00–0.07)
Basophils Absolute: 0 10*3/uL (ref 0.0–0.1)
Basophils Relative: 1 %
Eosinophils Absolute: 0.1 10*3/uL (ref 0.0–0.5)
Eosinophils Relative: 2 %
HCT: 33.4 % — ABNORMAL LOW (ref 39.0–52.0)
Hemoglobin: 11.3 g/dL — ABNORMAL LOW (ref 13.0–17.0)
Immature Granulocytes: 0 %
Lymphocytes Relative: 41 %
Lymphs Abs: 1.8 10*3/uL (ref 0.7–4.0)
MCH: 32.1 pg (ref 26.0–34.0)
MCHC: 33.8 g/dL (ref 30.0–36.0)
MCV: 94.9 fL (ref 80.0–100.0)
Monocytes Absolute: 0.4 10*3/uL (ref 0.1–1.0)
Monocytes Relative: 8 %
Neutro Abs: 2.1 10*3/uL (ref 1.7–7.7)
Neutrophils Relative %: 48 %
Platelets: 204 10*3/uL (ref 150–400)
RBC: 3.52 MIL/uL — ABNORMAL LOW (ref 4.22–5.81)
RDW: 13.1 % (ref 11.5–15.5)
WBC: 4.4 10*3/uL (ref 4.0–10.5)
nRBC: 0 % (ref 0.0–0.2)

## 2023-09-26 LAB — COMPREHENSIVE METABOLIC PANEL WITH GFR
ALT: 12 U/L (ref 0–44)
AST: 20 U/L (ref 15–41)
Albumin: 3.7 g/dL (ref 3.5–5.0)
Alkaline Phosphatase: 53 U/L (ref 38–126)
Anion gap: 6 (ref 5–15)
BUN: 18 mg/dL (ref 8–23)
CO2: 23 mmol/L (ref 22–32)
Calcium: 9 mg/dL (ref 8.9–10.3)
Chloride: 108 mmol/L (ref 98–111)
Creatinine, Ser: 1.63 mg/dL — ABNORMAL HIGH (ref 0.61–1.24)
GFR, Estimated: 42 mL/min — ABNORMAL LOW (ref >60)
Glucose, Bld: 108 mg/dL — ABNORMAL HIGH (ref 70–99)
Potassium: 3.9 mmol/L (ref 3.5–5.1)
Sodium: 137 mmol/L (ref 135–145)
Total Bilirubin: 1.1 mg/dL (ref 0.0–1.2)
Total Protein: 7.1 g/dL (ref 6.5–8.1)

## 2023-09-26 LAB — PSA: Prostatic Specific Antigen: 1.5 ng/mL (ref 0.00–4.00)

## 2023-09-26 LAB — FERRITIN: Ferritin: 216 ng/mL (ref 24–336)

## 2023-09-26 MED ORDER — DENOSUMAB 120 MG/1.7ML ~~LOC~~ SOLN
120.0000 mg | Freq: Once | SUBCUTANEOUS | Status: AC
  Administered 2023-09-26: 120 mg via SUBCUTANEOUS
  Filled 2023-09-26: qty 1.7

## 2023-09-26 NOTE — Patient Instructions (Signed)
 Denosumab Injection (Oncology) What is this medication? DENOSUMAB (den oh SUE mab) prevents weakened bones caused by cancer. It may also be used to treat noncancerous bone tumors that cannot be removed by surgery. It can also be used to treat high calcium levels in the blood caused by cancer. It works by blocking a protein that causes bones to break down quickly. This slows down the release of calcium from bones, which lowers calcium levels in your blood. It also makes your bones stronger and less likely to break (fracture). This medicine may be used for other purposes; ask your health care provider or pharmacist if you have questions. COMMON BRAND NAME(S): XGEVA What should I tell my care team before I take this medication? They need to know if you have any of these conditions: Dental disease Having surgery or tooth extraction Infection Kidney disease Low levels of calcium or vitamin D in the blood Malnutrition On hemodialysis Skin conditions or sensitivity Thyroid or parathyroid disease An unusual reaction to denosumab, other medications, foods, dyes, or preservatives Pregnant or trying to get pregnant Breast-feeding How should I use this medication? This medication is for injection under the skin. It is given by your care team in a hospital or clinic setting. A special MedGuide will be given to you before each treatment. Be sure to read this information carefully each time. Talk to your care team about the use of this medication in children. While it may be prescribed for children as young as 13 years for selected conditions, precautions do apply. Overdosage: If you think you have taken too much of this medicine contact a poison control center or emergency room at once. NOTE: This medicine is only for you. Do not share this medicine with others. What if I miss a dose? Keep appointments for follow-up doses. It is important not to miss your dose. Call your care team if you are unable to  keep an appointment. What may interact with this medication? Do not take this medication with any of the following: Other medications containing denosumab This medication may also interact with the following: Medications that lower your chance of fighting infection Steroid medications, such as prednisone or cortisone This list may not describe all possible interactions. Give your health care provider a list of all the medicines, herbs, non-prescription drugs, or dietary supplements you use. Also tell them if you smoke, drink alcohol, or use illegal drugs. Some items may interact with your medicine. What should I watch for while using this medication? Your condition will be monitored carefully while you are receiving this medication. You may need blood work while taking this medication. This medication may increase your risk of getting an infection. Call your care team for advice if you get a fever, chills, sore throat, or other symptoms of a cold or flu. Do not treat yourself. Try to avoid being around people who are sick. You should make sure you get enough calcium and vitamin D while you are taking this medication, unless your care team tells you not to. Discuss the foods you eat and the vitamins you take with your care team. Some people who take this medication have severe bone, joint, or muscle pain. This medication may also increase your risk for jaw problems or a broken thigh bone. Tell your care team right away if you have severe pain in your jaw, bones, joints, or muscles. Tell your care team if you have any pain that does not go away or that gets worse. Talk  to your care team if you may be pregnant. Serious birth defects can occur if you take this medication during pregnancy and for 5 months after the last dose. You will need a negative pregnancy test before starting this medication. Contraception is recommended while taking this medication and for 5 months after the last dose. Your care team  can help you find the option that works for you. What side effects may I notice from receiving this medication? Side effects that you should report to your care team as soon as possible: Allergic reactions--skin rash, itching, hives, swelling of the face, lips, tongue, or throat Bone, joint, or muscle pain Low calcium level--muscle pain or cramps, confusion, tingling, or numbness in the hands or feet Osteonecrosis of the jaw--pain, swelling, or redness in the mouth, numbness of the jaw, poor healing after dental work, unusual discharge from the mouth, visible bones in the mouth Side effects that usually do not require medical attention (report to your care team if they continue or are bothersome): Cough Diarrhea Fatigue Headache Nausea This list may not describe all possible side effects. Call your doctor for medical advice about side effects. You may report side effects to FDA at 1-800-FDA-1088. Where should I keep my medication? This medication is given in a hospital or clinic. It will not be stored at home. NOTE: This sheet is a summary. It may not cover all possible information. If you have questions about this medicine, talk to your doctor, pharmacist, or health care provider.  2024 Elsevier/Gold Standard (2021-08-30 00:00:00)

## 2023-09-26 NOTE — Patient Instructions (Addendum)
 [facility]  Discharge Instructions  You were seen and examined today by Dr. Cheree Cords.  Dr. Cheree Cords discussed your most recent lab work which revealed that your hemoglobin low. Dr. Katragadda is going to add on labs to check your iron levels. Dr. Cheree Cords will have someone call you with your PSA results.  You will receive your bone shot Xgeva  today.   Follow-up as scheduled.    Thank you for choosing [facility] to provide your oncology and hematology care.   To afford each patient quality time with our provider, please arrive at least 15 minutes before your scheduled appointment time. You may need to reschedule your appointment if you arrive late (10 or more minutes). Arriving late affects you and other patients whose appointments are after yours.  Also, if you miss three or more appointments without notifying the office, you may be dismissed from the clinic at the provider's discretion.    Again, thank you for choosing [facility].  Our hope is that these requests will decrease the amount of time that you wait before being seen by our physicians.   If you have a lab appointment with the Cancer Center - please note that after April 8th, all labs will be drawn in the cancer center.  You do not have to check in or register with the main entrance as you have in the past but will complete your check-in at the cancer center.            _____________________________________________________________  Should you have questions after your visit to [facility], please contact our office at [phone] and follow the prompts.  Our office hours are 8:00 a.m. to 4:30 p.m. Monday - Thursday and 8:00 a.m. to 2:30 p.m. Friday.  Please note that voicemails left after 4:00 p.m. may not be returned until the following business day.  We are closed weekends and all major holidays.  You do have access to a nurse 24-7, just  call the main number to the clinic [phone] and do not press any options, hold on the line and a nurse will answer the phone.    For prescription refill requests, have your pharmacy contact our office and allow 72 hours.    Masks are no longer required in the cancer centers. If you would like for your care team to wear a mask while they are taking care of you, please let them know. You may have one support person who is at least 84 years old accompany you for your appointments.

## 2023-09-26 NOTE — Progress Notes (Signed)
 Labs reviewed with MD and treatment team. Patient tolerated Xgeva injection with no complaints voiced.  Site clean and dry with no bruising or swelling noted at site.  See MAR for details.  Band aid applied.  Patient stable during and after injection.  Vss with discharge and left in satisfactory condition with no s/s of distress noted. All follow ups as scheduled.   Brieana Shimmin [employer]

## 2023-09-26 NOTE — Progress Notes (Signed)
Patient has been assessed, vital signs and labs have been reviewed by Dr. Katragadda. ANC, Creatinine, LFTs, and Platelets are within treatment parameters per Dr. Katragadda. The patient is good to proceed with Xgeva treatment at this time.  Primary RN and pharmacy aware. ° °
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
## 2023-09-30 ENCOUNTER — Other Ambulatory Visit: Payer: Self-pay

## 2023-10-24 ENCOUNTER — Other Ambulatory Visit: Payer: Self-pay | Admitting: Hematology

## 2023-10-28 ENCOUNTER — Other Ambulatory Visit: Payer: Self-pay | Admitting: Hematology

## 2023-10-28 ENCOUNTER — Other Ambulatory Visit: Payer: Self-pay

## 2023-10-28 ENCOUNTER — Encounter (INDEPENDENT_AMBULATORY_CARE_PROVIDER_SITE_OTHER): Payer: Self-pay

## 2023-10-28 ENCOUNTER — Other Ambulatory Visit (HOSPITAL_COMMUNITY): Payer: Self-pay

## 2023-10-28 ENCOUNTER — Other Ambulatory Visit: Payer: Self-pay | Admitting: Pharmacy Technician

## 2023-10-28 MED ORDER — NUBEQA 300 MG PO TABS
600.0000 mg | ORAL_TABLET | Freq: Two times a day (BID) | ORAL | 3 refills | Status: DC
  Filled 2023-10-28: qty 120, 30d supply, fill #0
  Filled 2023-11-28: qty 120, 30d supply, fill #1
  Filled 2023-12-27: qty 120, 30d supply, fill #2
  Filled 2024-01-21: qty 120, 30d supply, fill #3

## 2023-10-28 NOTE — Progress Notes (Signed)
 Specialty Pharmacy Refill Coordination Note  Carlos Harper is a 83 y.o. male contacted today regarding refills of specialty medication(s) Darolutamide  (Nubeqa )   Patient requested (Patient-Rptd) Delivery   Delivery date: 10/30/2023 Verified address: (Patient-Rptd) [address]  [place]   Medication will be filled on 10/29/2023.

## 2023-10-31 ENCOUNTER — Other Ambulatory Visit: Payer: Self-pay

## 2023-11-12 ENCOUNTER — Other Ambulatory Visit: Payer: Self-pay | Admitting: *Deleted

## 2023-11-12 ENCOUNTER — Encounter: Payer: Self-pay | Admitting: *Deleted

## 2023-11-12 DIAGNOSIS — C7951 Secondary malignant neoplasm of bone: Secondary | ICD-10-CM

## 2023-11-12 DIAGNOSIS — C61 Malignant neoplasm of prostate: Secondary | ICD-10-CM

## 2023-11-12 NOTE — Progress Notes (Signed)
 PSA elevated @ 1.50 from 0.75 4 months ago.  Per Dr. Katragadda, will schedule for PSMA PET and follow up.  Patient's daughter [name] aware.

## 2023-11-18 ENCOUNTER — Other Ambulatory Visit: Payer: Self-pay

## 2023-11-18 NOTE — Progress Notes (Signed)
 Specialty Pharmacy Ongoing Clinical Assessment Note  Spoke to patient's daughter, Carlos Harper is a 83 y.o. 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   Therapeutic benefit summary: Patient is NOT achieving benefit   Adverse events/side effects summary: No adverse events/side effects   Patient's therapy is appropriate to: Continue    Goals Addressed             This Visit's Progress    Slow Disease Progression   Worsening    Patient is not on track and worsening. Patient will maintain adherence, adhere to provider and/or lab appointments, and be monitored by provider to determine if a change in treatment plan is warranted. Patient's PSA continues to rise slowly and was most recently 1.5 on 09/26/23.  Dr. Katragadda is closely monitoring and will adjust therapy as needed.          Follow up: 3 months  [facility]

## 2023-11-21 ENCOUNTER — Encounter (HOSPITAL_COMMUNITY)
Admission: RE | Admit: 2023-11-21 | Discharge: 2023-11-21 | Disposition: A | Discharge status: Home / Self Care | Source: Ambulatory Visit | Attending: Hematology | Admitting: Hematology

## 2023-11-21 DIAGNOSIS — C61 Malignant neoplasm of prostate: Secondary | ICD-10-CM | POA: Insufficient documentation

## 2023-11-21 DIAGNOSIS — C7951 Secondary malignant neoplasm of bone: Secondary | ICD-10-CM | POA: Insufficient documentation

## 2023-11-21 MED ORDER — FLOTUFOLASTAT F 18 GALLIUM 296-5846 MBQ/ML IV SOLN
8.3500 | Freq: Once | INTRAVENOUS | Status: AC
  Administered 2023-11-21: 8.35 via INTRAVENOUS
  Filled 2023-11-21: qty 9

## 2023-11-27 ENCOUNTER — Encounter (INDEPENDENT_AMBULATORY_CARE_PROVIDER_SITE_OTHER): Payer: Self-pay

## 2023-11-28 ENCOUNTER — Inpatient Hospital Stay: Attending: Hematology | Admitting: Hematology

## 2023-11-28 ENCOUNTER — Other Ambulatory Visit: Payer: Self-pay

## 2023-11-28 ENCOUNTER — Inpatient Hospital Stay

## 2023-11-28 ENCOUNTER — Ambulatory Visit: Admitting: Oncology

## 2023-11-28 ENCOUNTER — Encounter: Payer: Self-pay | Admitting: Hematology

## 2023-11-28 ENCOUNTER — Other Ambulatory Visit: Payer: Self-pay | Admitting: Pharmacy Technician

## 2023-11-28 VITALS — BP 135/76 | HR 57 | Temp 97.9°F | Resp 16 | Wt 166.9 lb

## 2023-11-28 DIAGNOSIS — I129 Hypertensive chronic kidney disease with stage 1 through stage 4 chronic kidney disease, or unspecified chronic kidney disease: Secondary | ICD-10-CM | POA: Insufficient documentation

## 2023-11-28 DIAGNOSIS — Z79899 Other long term (current) drug therapy: Secondary | ICD-10-CM | POA: Diagnosis not present

## 2023-11-28 DIAGNOSIS — C61 Malignant neoplasm of prostate: Secondary | ICD-10-CM | POA: Diagnosis not present

## 2023-11-28 DIAGNOSIS — N189 Chronic kidney disease, unspecified: Secondary | ICD-10-CM | POA: Diagnosis not present

## 2023-11-28 DIAGNOSIS — D631 Anemia in chronic kidney disease: Secondary | ICD-10-CM | POA: Diagnosis not present

## 2023-11-28 DIAGNOSIS — C7951 Secondary malignant neoplasm of bone: Secondary | ICD-10-CM | POA: Insufficient documentation

## 2023-11-28 DIAGNOSIS — G629 Polyneuropathy, unspecified: Secondary | ICD-10-CM | POA: Diagnosis not present

## 2023-11-28 LAB — BASIC METABOLIC PANEL WITH GFR
Anion gap: 12 (ref 5–15)
BUN: 23 mg/dL (ref 8–23)
CO2: 23 mmol/L (ref 22–32)
Calcium: 9.4 mg/dL (ref 8.9–10.3)
Chloride: 104 mmol/L (ref 98–111)
Creatinine, Ser: 1.45 mg/dL — ABNORMAL HIGH (ref 0.61–1.24)
GFR, Estimated: 48 mL/min — ABNORMAL LOW (ref >60)
Glucose, Bld: 87 mg/dL (ref 70–99)
Potassium: 4.1 mmol/L (ref 3.5–5.1)
Sodium: 139 mmol/L (ref 135–145)

## 2023-11-28 LAB — PSA: Prostatic Specific Antigen: 2.11 ng/mL (ref 0.00–4.00)

## 2023-11-28 MED ORDER — DENOSUMAB 120 MG/1.7ML ~~LOC~~ SOLN
120.0000 mg | Freq: Once | SUBCUTANEOUS | Status: AC
Start: 2023-11-28 — End: 2023-11-28
  Administered 2023-11-28: 120 mg via SUBCUTANEOUS
  Filled 2023-11-28: qty 1.7

## 2023-11-28 NOTE — Progress Notes (Signed)
 Patient was here today for xgeva  injection per MD instructions. His calcium is 9.4.  He denies any recent oral surgery or having any upcoming appointments.  Xgeva  given without incidence.  Patient was discharged ambulatory and in stable condition.  He will follow up as scheduled.
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
## 2023-11-28 NOTE — Patient Instructions (Addendum)
 [facility]  Discharge Instructions  You were seen and examined today by Dr. Rogers.  Dr. Rogers discussed your most recent lab work and getting radiation done to your T2 and T12 bones.   Continue taking the Nubeqa  as prescribed.  Follow-up as scheduled.    Thank you for choosing [name] to provide your oncology and hematology care.   To afford each patient quality time with our provider, please arrive at least 15 minutes before your scheduled appointment time. You may need to reschedule your appointment if you arrive late (10 or more minutes). Arriving late affects you and other patients whose appointments are after yours.  Also, if you miss three or more appointments without notifying the office, you may be dismissed from the clinic at the provider's discretion.    Again, thank you for choosing [facility].  Our hope is that these requests will decrease the amount of time that you wait before being seen by our physicians.   If you have a lab appointment with the Cancer Center - please note that after April 8th, all labs will be drawn in the cancer center.  You do not have to check in or register with the main entrance as you have in the past but will complete your check-in at the cancer center.            _____________________________________________________________  Should you have questions after your visit to [facility], please contact our office at [phone] and follow the prompts.  Our office hours are 8:00 a.m. to 4:30 p.m. Monday - Thursday and 8:00 a.m. to 2:30 p.m. Friday.  Please note that voicemails left after 4:00 p.m. may not be returned until the following business day.  We are closed weekends and all major holidays.  You do have access to a nurse 24-7, just call the main number to the clinic [phone] and do not press any options, hold on the line and a nurse will answer the  phone.    For prescription refill requests, have your pharmacy contact our office and allow 72 hours.    Masks are no longer required in the cancer centers. If you would like for your care team to wear a mask while they are taking care of you, please let them know. You may have one support person who is at least 84 years old accompany you for your appointments.

## 2023-11-28 NOTE — Progress Notes (Signed)
 Specialty Pharmacy Refill Coordination Note  Carlos Harper is a 83 y.o. male contacted today regarding refills of specialty medication(s) Darolutamide  (Nubeqa )   Patient requested (Patient-Rptd) Delivery   Delivery date: 12/03/23  Verified address: (Patient-Rptd) [address] [place]  [place] [address]   Medication will be filled on 12/02/23.

## 2023-11-28 NOTE — Progress Notes (Signed)
 Patient reports taking Nubeqa  twice daily.  He denies missing any doses.  Denies any side effects.

## 2023-11-28 NOTE — Patient Instructions (Signed)
 [facility] - A DEPT OF [facility]  Discharge Instructions: Thank you for choosing [facility] to provide your oncology and hematology care.  If you have a lab appointment with the Cancer Center - please note that after April 8th, 2024, all labs will be drawn in the cancer center.  You do not have to check in or register with the main entrance as you have in the past but will complete your check-in in the cancer center.  Wear comfortable clothing and clothing appropriate for easy access to any Portacath or PICC line.   We strive to give you quality time with your provider. You may need to reschedule your appointment if you arrive late (15 or more minutes).  Arriving late affects you and other patients whose appointments are after yours.  Also, if you miss three or more appointments without notifying the office, you may be dismissed from the clinic at the provider's discretion.      For prescription refill requests, have your pharmacy contact our office and allow 72 hours for refills to be completed.    Today you received the following: Xgeva       To help prevent nausea and vomiting after your treatment, we encourage you to take your nausea medication as directed.  BELOW ARE SYMPTOMS THAT SHOULD BE REPORTED IMMEDIATELY: *FEVER GREATER THAN 100.4 F (38 C) OR HIGHER *CHILLS OR SWEATING *NAUSEA AND VOMITING THAT IS NOT CONTROLLED WITH YOUR NAUSEA MEDICATION *UNUSUAL SHORTNESS OF BREATH *UNUSUAL BRUISING OR BLEEDING *URINARY PROBLEMS (pain or burning when urinating, or frequent urination) *BOWEL PROBLEMS (unusual diarrhea, constipation, pain near the anus) TENDERNESS IN MOUTH AND THROAT WITH OR WITHOUT PRESENCE OF ULCERS (sore throat, sores in mouth, or a toothache) UNUSUAL RASH, SWELLING OR PAIN  UNUSUAL VAGINAL DISCHARGE OR ITCHING   Items with * indicate a potential emergency and should be followed up as soon as possible or go to the Emergency  Department if any problems should occur.  Please show the CHEMOTHERAPY ALERT CARD or IMMUNOTHERAPY ALERT CARD at check-in to the Emergency Department and triage nurse.  Should you have questions after your visit or need to cancel or reschedule your appointment, please contact [facility] [place] - A DEPT OF [facility] [phone]  and follow the prompts.  Office hours are 8:00 a.m. to 4:30 p.m. Monday - Friday. Please note that voicemails left after 4:00 p.m. may not be returned until the following business day.  We are closed weekends and major holidays. You have access to a nurse at all times for urgent questions. Please call the main number to the clinic [phone] and follow the prompts.  For any non-urgent questions, you may also contact your provider using MyChart. We now offer e-Visits for anyone 56 and older to request care online for non-urgent symptoms. For details visit [email or web address].   Also download the MyChart app! Go to the app store, search MyChart, open the app, select [facility], and log in with your MyChart username and password.

## 2023-12-02 ENCOUNTER — Other Ambulatory Visit: Payer: Self-pay

## 2023-12-02 DIAGNOSIS — C7951 Secondary malignant neoplasm of bone: Secondary | ICD-10-CM | POA: Diagnosis not present

## 2023-12-02 DIAGNOSIS — C61 Malignant neoplasm of prostate: Secondary | ICD-10-CM | POA: Diagnosis not present

## 2023-12-10 DIAGNOSIS — C61 Malignant neoplasm of prostate: Secondary | ICD-10-CM | POA: Diagnosis not present

## 2023-12-10 DIAGNOSIS — M4184 Other forms of scoliosis, thoracic region: Secondary | ICD-10-CM | POA: Diagnosis not present

## 2023-12-10 DIAGNOSIS — C7951 Secondary malignant neoplasm of bone: Secondary | ICD-10-CM | POA: Diagnosis not present

## 2023-12-10 DIAGNOSIS — M48061 Spinal stenosis, lumbar region without neurogenic claudication: Secondary | ICD-10-CM | POA: Diagnosis not present

## 2023-12-17 DIAGNOSIS — C61 Malignant neoplasm of prostate: Secondary | ICD-10-CM | POA: Diagnosis not present

## 2023-12-17 DIAGNOSIS — C7951 Secondary malignant neoplasm of bone: Secondary | ICD-10-CM | POA: Diagnosis not present

## 2023-12-25 ENCOUNTER — Other Ambulatory Visit: Payer: Self-pay

## 2023-12-25 DIAGNOSIS — C61 Malignant neoplasm of prostate: Secondary | ICD-10-CM

## 2023-12-26 ENCOUNTER — Inpatient Hospital Stay: Attending: Hematology

## 2023-12-26 ENCOUNTER — Inpatient Hospital Stay

## 2023-12-26 VITALS — BP 143/85 | HR 53 | Temp 97.9°F | Resp 17

## 2023-12-26 DIAGNOSIS — C7951 Secondary malignant neoplasm of bone: Secondary | ICD-10-CM | POA: Diagnosis present

## 2023-12-26 DIAGNOSIS — C61 Malignant neoplasm of prostate: Secondary | ICD-10-CM | POA: Diagnosis present

## 2023-12-26 LAB — COMPREHENSIVE METABOLIC PANEL WITH GFR
ALT: 12 U/L (ref 0–44)
AST: 21 U/L (ref 15–41)
Albumin: 4 g/dL (ref 3.5–5.0)
Alkaline Phosphatase: 50 U/L (ref 38–126)
Anion gap: 9 (ref 5–15)
BUN: 20 mg/dL (ref 8–23)
CO2: 23 mmol/L (ref 22–32)
Calcium: 8.8 mg/dL — ABNORMAL LOW (ref 8.9–10.3)
Chloride: 106 mmol/L (ref 98–111)
Creatinine, Ser: 1.4 mg/dL — ABNORMAL HIGH (ref 0.61–1.24)
GFR, Estimated: 50 mL/min — ABNORMAL LOW (ref >60)
Glucose, Bld: 98 mg/dL (ref 70–99)
Potassium: 4.2 mmol/L (ref 3.5–5.1)
Sodium: 138 mmol/L (ref 135–145)
Total Bilirubin: 1.1 mg/dL (ref 0.0–1.2)
Total Protein: 7.3 g/dL (ref 6.5–8.1)

## 2023-12-26 MED ORDER — DENOSUMAB 120 MG/1.7ML ~~LOC~~ SOLN
120.0000 mg | Freq: Once | SUBCUTANEOUS | Status: AC
  Administered 2023-12-26: 120 mg via SUBCUTANEOUS
  Filled 2023-12-26: qty 1.7

## 2023-12-26 NOTE — Patient Instructions (Signed)
 [facility]  - [facility]  Discharge Instructions: Thank you for choosing [facility] to provide your oncology and hematology care.  If you have a lab appointment with the Cancer Center - please note that after April 8th, 2024, all labs will be drawn in the cancer center.  You do not have to check in or register with the main entrance as you have in the past but will complete your check-in in the cancer center.  Wear comfortable clothing and clothing appropriate for easy access to any Portacath or PICC line.   We strive to give you quality time with your provider. You may need to reschedule your appointment if you arrive late (15 or more minutes).  Arriving late affects you and other patients whose appointments are after yours.  Also, if you miss three or more appointments without notifying the office, you may be dismissed from the clinic at the provider's discretion.      For prescription refill requests, have your pharmacy contact our office and allow 72 hours for refills to be completed.    Today you received the following chemotherapy and/or immunotherapy agents Xgeva Denosumab Injection (Oncology) What is this medication? DENOSUMAB (den oh SUE mab) prevents weakened bones caused by cancer. It may also be used to treat noncancerous bone tumors that cannot be removed by surgery. It can also be used to treat high calcium levels in the blood caused by cancer. It works by blocking a protein that causes bones to break down quickly. This slows down the release of calcium from bones, which lowers calcium levels in your blood. It also makes your bones stronger and less likely to break (fracture). This medicine may be used for other purposes; ask your health care provider or pharmacist if you have questions. COMMON BRAND NAME(S): XGEVA What should I tell my care team before I take this medication? They need to know if you have any of these  conditions: Dental disease Having surgery or tooth extraction Infection Kidney disease Low levels of calcium or vitamin D in the blood Malnutrition On hemodialysis Skin conditions or sensitivity Thyroid or parathyroid disease An unusual reaction to denosumab, other medications, foods, dyes, or preservatives Pregnant or trying to get pregnant Breast-feeding How should I use this medication? This medication is for injection under the skin. It is given by your care team in a hospital or clinic setting. A special MedGuide will be given to you before each treatment. Be sure to read this information carefully each time. Talk to your care team about the use of this medication in children. While it may be prescribed for children as young as 13 years for selected conditions, precautions do apply. Overdosage: If you think you have taken too much of this medicine contact a poison control center or emergency room at once. NOTE: This medicine is only for you. Do not share this medicine with others. What if I miss a dose? Keep appointments for follow-up doses. It is important not to miss your dose. Call your care team if you are unable to keep an appointment. What may interact with this medication? Do not take this medication with any of the following: Other medications containing denosumab This medication may also interact with the following: Medications that lower your chance of fighting infection Steroid medications, such as prednisone or cortisone This list may not describe all possible interactions. Give your health care provider a list of all the medicines,  herbs, non-prescription drugs, or dietary supplements you use. Also tell them if you smoke, drink alcohol, or use illegal drugs. Some items may interact with your medicine. What should I watch for while using this medication? Your condition will be monitored carefully while you are receiving this medication. You may need blood work while  taking this medication. This medication may increase your risk of getting an infection. Call your care team for advice if you get a fever, chills, sore throat, or other symptoms of a cold or flu. Do not treat yourself. Try to avoid being around people who are sick. You should make sure you get enough calcium and vitamin D while you are taking this medication, unless your care team tells you not to. Discuss the foods you eat and the vitamins you take with your care team. Some people who take this medication have severe bone, joint, or muscle pain. This medication may also increase your risk for jaw problems or a broken thigh bone. Tell your care team right away if you have severe pain in your jaw, bones, joints, or muscles. Tell your care team if you have any pain that does not go away or that gets worse. Talk to your care team if you may be pregnant. Serious birth defects can occur if you take this medication during pregnancy and for 5 months after the last dose. You will need a negative pregnancy test before starting this medication. Contraception is recommended while taking this medication and for 5 months after the last dose. Your care team can help you find the option that works for you. What side effects may I notice from receiving this medication? Side effects that you should report to your care team as soon as possible: Allergic reactions--skin rash, itching, hives, swelling of the face, lips, tongue, or throat Bone, joint, or muscle pain Low calcium level--muscle pain or cramps, confusion, tingling, or numbness in the hands or feet Osteonecrosis of the jaw--pain, swelling, or redness in the mouth, numbness of the jaw, poor healing after dental work, unusual discharge from the mouth, visible bones in the mouth Side effects that usually do not require medical attention (report to your care team if they continue or are bothersome): Cough Diarrhea Fatigue Headache Nausea This list may not  describe all possible side effects. Call your doctor for medical advice about side effects. You may report side effects to FDA at 1-800-FDA-1088. Where should I keep my medication? This medication is given in a hospital or clinic. It will not be stored at home. NOTE: This sheet is a summary. It may not cover all possible information. If you have questions about this medicine, talk to your doctor, pharmacist, or health care provider.  2024 Elsevier/Gold Standard (2021-08-30 00:00:00)      To help prevent nausea and vomiting after your treatment, we encourage you to take your nausea medication as directed.  BELOW ARE SYMPTOMS THAT SHOULD BE REPORTED IMMEDIATELY: *FEVER GREATER THAN 100.4 F (38 C) OR HIGHER *CHILLS OR SWEATING *NAUSEA AND VOMITING THAT IS NOT CONTROLLED WITH YOUR NAUSEA MEDICATION *UNUSUAL SHORTNESS OF BREATH *UNUSUAL BRUISING OR BLEEDING *URINARY PROBLEMS (pain or burning when urinating, or frequent urination) *BOWEL PROBLEMS (unusual diarrhea, constipation, pain near the anus) TENDERNESS IN MOUTH AND THROAT WITH OR WITHOUT PRESENCE OF ULCERS (sore throat, sores in mouth, or a toothache) UNUSUAL RASH, SWELLING OR PAIN  UNUSUAL VAGINAL DISCHARGE OR ITCHING   Items with * indicate a potential emergency and should be followed up as soon  as possible or go to the Emergency Department if any problems should occur.  Please show the CHEMOTHERAPY ALERT CARD or IMMUNOTHERAPY ALERT CARD at check-in to the Emergency Department and triage nurse.  Should you have questions after your visit or need to cancel or reschedule your appointment, please contact [facility] [place] - A DEPT OF [facility] [phone]  and follow the prompts.  Office hours are 8:00 a.m. to 4:30 p.m. Monday - Friday. Please note that voicemails left after 4:00 p.m. may not be returned until the following business day.  We are closed weekends and major holidays. You have access to a nurse at  all times for urgent questions. Please call the main number to the clinic [phone] and follow the prompts.  For any non-urgent questions, you may also contact your provider using MyChart. We now offer e-Visits for anyone 2 and older to request care online for non-urgent symptoms. For details visit [email or web address].   Also download the MyChart app! Go to the app store, search "MyChart", open the app, select [place], and log in with your MyChart username and password.

## 2023-12-26 NOTE — Progress Notes (Signed)
 Patient presents today for Xgeva  injection. Creatinine 1.40 and calcium 8.8. Patient denies any jaw pain or upcoming dental surgery. Patient states he does not take calcium at home. Patient states he can't take calcium at home per patient's words.   Atthew Coutant presents today for injection per the provider's orders.  Xgeva  administration without incident; injection site WNL; see MAR for injection details.  Patient tolerated procedure well and without incident.  No questions or complaints noted at this time.   Discharged from clinic ambulatory in stable condition. Alert and oriented x 3. F/U with [facility] as scheduled.

## 2023-12-27 ENCOUNTER — Other Ambulatory Visit: Payer: Self-pay

## 2023-12-27 NOTE — Progress Notes (Signed)
 Specialty Pharmacy Refill Coordination Note  Carlos Harper is a 83 y.o. male contacted today regarding refills of specialty medication(s) Darolutamide  (Nubeqa )   Patient requested Delivery   Delivery date: 12/30/23   Verified address: [address]   [place] [address]   Medication will be filled on 12/27/23.

## 2024-01-03 DIAGNOSIS — C61 Malignant neoplasm of prostate: Secondary | ICD-10-CM | POA: Diagnosis not present

## 2024-01-03 DIAGNOSIS — C7951 Secondary malignant neoplasm of bone: Secondary | ICD-10-CM | POA: Diagnosis not present

## 2024-01-06 DIAGNOSIS — C61 Malignant neoplasm of prostate: Secondary | ICD-10-CM | POA: Diagnosis not present

## 2024-01-06 DIAGNOSIS — C7951 Secondary malignant neoplasm of bone: Secondary | ICD-10-CM | POA: Diagnosis not present

## 2024-01-15 DIAGNOSIS — C61 Malignant neoplasm of prostate: Secondary | ICD-10-CM | POA: Diagnosis not present

## 2024-01-15 DIAGNOSIS — C7951 Secondary malignant neoplasm of bone: Secondary | ICD-10-CM | POA: Diagnosis not present

## 2024-01-16 ENCOUNTER — Other Ambulatory Visit (HOSPITAL_COMMUNITY): Payer: Self-pay

## 2024-01-21 ENCOUNTER — Other Ambulatory Visit: Payer: Self-pay

## 2024-01-21 ENCOUNTER — Encounter (INDEPENDENT_AMBULATORY_CARE_PROVIDER_SITE_OTHER): Payer: Self-pay

## 2024-01-21 ENCOUNTER — Other Ambulatory Visit: Payer: Self-pay | Admitting: Pharmacy Technician

## 2024-01-21 NOTE — Progress Notes (Signed)
 Specialty Pharmacy Refill Coordination Note  Carlos Harper is a 83 y.o. male contacted today regarding refills of specialty medication(s)  Darolutamide  (Nubeqa )      Patient requested (Patient-Rptd) Delivery   Delivery date: 01/24/24 Verified address: (Patient-Rptd) [address]  [place]  [place]  [address]   Medication will be filled on 01/23/24.

## 2024-01-22 ENCOUNTER — Other Ambulatory Visit: Payer: Self-pay

## 2024-01-22 DIAGNOSIS — C61 Malignant neoplasm of prostate: Secondary | ICD-10-CM

## 2024-01-23 ENCOUNTER — Inpatient Hospital Stay: Attending: Hematology

## 2024-01-23 ENCOUNTER — Inpatient Hospital Stay

## 2024-01-23 ENCOUNTER — Other Ambulatory Visit: Payer: Self-pay

## 2024-01-23 VITALS — BP 122/69 | HR 69 | Temp 98.0°F | Resp 18 | Wt 165.1 lb

## 2024-01-23 DIAGNOSIS — C61 Malignant neoplasm of prostate: Secondary | ICD-10-CM | POA: Insufficient documentation

## 2024-01-23 DIAGNOSIS — D649 Anemia, unspecified: Secondary | ICD-10-CM | POA: Diagnosis not present

## 2024-01-23 DIAGNOSIS — N189 Chronic kidney disease, unspecified: Secondary | ICD-10-CM | POA: Diagnosis not present

## 2024-01-23 DIAGNOSIS — G629 Polyneuropathy, unspecified: Secondary | ICD-10-CM | POA: Insufficient documentation

## 2024-01-23 DIAGNOSIS — C7951 Secondary malignant neoplasm of bone: Secondary | ICD-10-CM | POA: Diagnosis present

## 2024-01-23 DIAGNOSIS — I129 Hypertensive chronic kidney disease with stage 1 through stage 4 chronic kidney disease, or unspecified chronic kidney disease: Secondary | ICD-10-CM | POA: Insufficient documentation

## 2024-01-23 LAB — COMPREHENSIVE METABOLIC PANEL WITH GFR
ALT: 10 U/L (ref 0–44)
AST: 17 U/L (ref 15–41)
Albumin: 4.4 g/dL (ref 3.5–5.0)
Alkaline Phosphatase: 55 U/L (ref 38–126)
Anion gap: 11 (ref 5–15)
BUN: 26 mg/dL — ABNORMAL HIGH (ref 8–23)
CO2: 23 mmol/L (ref 22–32)
Calcium: 9.8 mg/dL (ref 8.9–10.3)
Chloride: 106 mmol/L (ref 98–111)
Creatinine, Ser: 1.54 mg/dL — ABNORMAL HIGH (ref 0.61–1.24)
GFR, Estimated: 44 mL/min — ABNORMAL LOW (ref >60)
Glucose, Bld: 89 mg/dL (ref 70–99)
Potassium: 4.8 mmol/L (ref 3.5–5.1)
Sodium: 141 mmol/L (ref 135–145)
Total Bilirubin: 0.8 mg/dL (ref 0.0–1.2)
Total Protein: 7.3 g/dL (ref 6.5–8.1)

## 2024-01-23 MED ORDER — DENOSUMAB 120 MG/1.7ML ~~LOC~~ SOLN
120.0000 mg | Freq: Once | SUBCUTANEOUS | Status: AC
  Administered 2024-01-23: 120 mg via SUBCUTANEOUS
  Filled 2024-01-23: qty 1.7

## 2024-01-23 NOTE — Progress Notes (Signed)
Patient taking calcium as directed. Denied tooth, jaw, and leg pain. No recent or upcoming dental visits. Labs reviewed. Patient tolerated injection with no complaints voiced. See MAR for details. Patient stable during and after injection. Site clean and dry with no bruising or swelling noted. Band aid applied. Vss with discharge and left in satisfactory condition with no s/s of distress.  

## 2024-01-23 NOTE — Patient Instructions (Signed)
 [facility] [place] - [facility]  Discharge Instructions: Thank you for choosing [facility] to provide your oncology and hematology care.  If you have a lab appointment with the Cancer Center - please note that after April 8th, 2024, all labs will be drawn in the cancer center.  You do not have to check in or register with the main entrance as you have in the past but will complete your check-in in the cancer center.  Wear comfortable clothing and clothing appropriate for easy access to any Portacath or PICC line.   We strive to give you quality time with your provider. You may need to reschedule your appointment if you arrive late (15 or more minutes).  Arriving late affects you and other patients whose appointments are after yours.  Also, if you miss three or more appointments without notifying the office, you may be dismissed from the clinic at the provider's discretion.      For prescription refill requests, have your pharmacy contact our office and allow 72 hours for refills to be completed.    Today you received the following  Xgeva, return as scheduled.   To help prevent nausea and vomiting after your treatment, we encourage you to take your nausea medication as directed.  BELOW ARE SYMPTOMS THAT SHOULD BE REPORTED IMMEDIATELY: *FEVER GREATER THAN 100.4 F (38 C) OR HIGHER *CHILLS OR SWEATING *NAUSEA AND VOMITING THAT IS NOT CONTROLLED WITH YOUR NAUSEA MEDICATION *UNUSUAL SHORTNESS OF BREATH *UNUSUAL BRUISING OR BLEEDING *URINARY PROBLEMS (pain or burning when urinating, or frequent urination) *BOWEL PROBLEMS (unusual diarrhea, constipation, pain near the anus) TENDERNESS IN MOUTH AND THROAT WITH OR WITHOUT PRESENCE OF ULCERS (sore throat, sores in mouth, or a toothache) UNUSUAL RASH, SWELLING OR PAIN  UNUSUAL VAGINAL DISCHARGE OR ITCHING   Items with * indicate a potential emergency and should be followed up as soon as possible or  go to the Emergency Department if any problems should occur.  Please show the CHEMOTHERAPY ALERT CARD or IMMUNOTHERAPY ALERT CARD at check-in to the Emergency Department and triage nurse.  Should you have questions after your visit or need to cancel or reschedule your appointment, please contact [facility]CER [facility] [place] - A DEPT OF [facility] [phone]  and follow the prompts.  Office hours are 8:00 a.m. to 4:30 p.m. Monday - Friday. Please note that voicemails left after 4:00 p.m. may not be returned until the following business day.  We are closed weekends and major holidays. You have access to a nurse at all times for urgent questions. Please call the main number to the clinic [phone] and follow the prompts.  For any non-urgent questions, you may also contact your provider using MyChart. We now offer e-Visits for anyone 56 and older to request care online for non-urgent symptoms. For details visit [email or web address].   Also download the MyChart app! Go to the app store, search "MyChart", open the app, select [place], and log in with your MyChart username and password.

## 2024-01-31 ENCOUNTER — Other Ambulatory Visit: Payer: Self-pay

## 2024-01-31 NOTE — Progress Notes (Signed)
 Specialty Pharmacy Ongoing Clinical Assessment Note  Carlos Harper is a 83 y.o. 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   Therapeutic benefit summary: Patient is NOT achieving benefit   Adverse events/side effects summary: No adverse events/side effects   Patient's therapy is appropriate to: Continue    Goals Addressed             This Visit's Progress    Slow Disease Progression       Patient is not on track and worsening. Patient will maintain adherence, adhere to provider and/or lab appointments, and be monitored by provider to determine if a change in treatment plan is warranted. Patient's PSA continues to rise slowly and was most recently 2.11 on 11/28/23.  Patient began radiation as well. Dr. Katragadda is closely monitoring and will adjust therapy as needed.          Follow up: 3 months  Carlos Harper Carlos Harper Specialty Pharmacist

## 2024-02-13 ENCOUNTER — Inpatient Hospital Stay (HOSPITAL_BASED_OUTPATIENT_CLINIC_OR_DEPARTMENT_OTHER)

## 2024-02-13 DIAGNOSIS — C61 Malignant neoplasm of prostate: Secondary | ICD-10-CM

## 2024-02-13 LAB — COMPREHENSIVE METABOLIC PANEL WITH GFR
ALT: 9 U/L (ref 0–44)
AST: 23 U/L (ref 15–41)
Albumin: 4.3 g/dL (ref 3.5–5.0)
Alkaline Phosphatase: 57 U/L (ref 38–126)
Anion gap: 13 (ref 5–15)
BUN: 22 mg/dL (ref 8–23)
CO2: 21 mmol/L — ABNORMAL LOW (ref 22–32)
Calcium: 9.2 mg/dL (ref 8.9–10.3)
Chloride: 106 mmol/L (ref 98–111)
Creatinine, Ser: 1.5 mg/dL — ABNORMAL HIGH (ref 0.61–1.24)
GFR, Estimated: 46 mL/min — ABNORMAL LOW (ref >60)
Glucose, Bld: 83 mg/dL (ref 70–99)
Potassium: 3.9 mmol/L (ref 3.5–5.1)
Sodium: 140 mmol/L (ref 135–145)
Total Bilirubin: 0.8 mg/dL (ref 0.0–1.2)
Total Protein: 7.4 g/dL (ref 6.5–8.1)

## 2024-02-13 LAB — CBC WITH DIFFERENTIAL/PLATELET
Abs Immature Granulocytes: 0.01 K/uL (ref 0.00–0.07)
Basophils Absolute: 0 K/uL (ref 0.0–0.1)
Basophils Relative: 1 %
Eosinophils Absolute: 0 K/uL (ref 0.0–0.5)
Eosinophils Relative: 1 %
HCT: 35 % — ABNORMAL LOW (ref 39.0–52.0)
Hemoglobin: 11.7 g/dL — ABNORMAL LOW (ref 13.0–17.0)
Immature Granulocytes: 0 %
Lymphocytes Relative: 34 %
Lymphs Abs: 1 K/uL (ref 0.7–4.0)
MCH: 31.9 pg (ref 26.0–34.0)
MCHC: 33.4 g/dL (ref 30.0–36.0)
MCV: 95.4 fL (ref 80.0–100.0)
Monocytes Absolute: 0.3 K/uL (ref 0.1–1.0)
Monocytes Relative: 10 %
Neutro Abs: 1.6 K/uL — ABNORMAL LOW (ref 1.7–7.7)
Neutrophils Relative %: 54 %
Platelets: 192 K/uL (ref 150–400)
RBC: 3.67 MIL/uL — ABNORMAL LOW (ref 4.22–5.81)
RDW: 13.2 % (ref 11.5–15.5)
WBC: 3 K/uL — ABNORMAL LOW (ref 4.0–10.5)
nRBC: 0 % (ref 0.0–0.2)

## 2024-02-13 LAB — PSA: Prostatic Specific Antigen: 1.35 ng/mL (ref 0.00–4.00)

## 2024-02-14 ENCOUNTER — Other Ambulatory Visit: Payer: Self-pay

## 2024-02-14 ENCOUNTER — Other Ambulatory Visit: Payer: Self-pay | Admitting: Hematology

## 2024-02-14 NOTE — Progress Notes (Signed)
 Specialty Pharmacy Refill Coordination Note  Carlos Harper is a 83 y.o. male contacted today regarding refills of specialty medication(s) Darolutamide  (Nubeqa )   Patient requested (Patient-Rptd) Delivery   Delivery date: 02/19/24   Verified address: (Patient-Rptd) [address] [place]  [place] [address]   Medication will be filled on 02/18/24.    This fill date is pending response to refill request from provider. Patient is aware and if they have not received fill by intended date they must follow up with pharmacy.

## 2024-02-17 ENCOUNTER — Other Ambulatory Visit: Payer: Self-pay

## 2024-02-17 ENCOUNTER — Other Ambulatory Visit: Payer: Self-pay | Admitting: Oncology

## 2024-02-17 ENCOUNTER — Other Ambulatory Visit (HOSPITAL_COMMUNITY): Payer: Self-pay

## 2024-02-17 MED ORDER — NUBEQA 300 MG PO TABS
600.0000 mg | ORAL_TABLET | Freq: Two times a day (BID) | ORAL | 3 refills | Status: AC
  Filled 2024-02-17: qty 120, 30d supply, fill #0
  Filled 2024-03-12 – 2024-03-16 (×2): qty 120, 30d supply, fill #1
  Filled 2024-04-13 – 2024-04-14 (×2): qty 120, 30d supply, fill #2
  Filled 2024-05-13 – 2024-05-14 (×2): qty 120, 30d supply, fill #3

## 2024-02-18 ENCOUNTER — Other Ambulatory Visit: Payer: Self-pay

## 2024-02-18 ENCOUNTER — Other Ambulatory Visit (HOSPITAL_COMMUNITY): Payer: Self-pay

## 2024-02-20 ENCOUNTER — Inpatient Hospital Stay: Admitting: Oncology

## 2024-02-20 ENCOUNTER — Inpatient Hospital Stay

## 2024-02-20 VITALS — BP 131/79 | HR 57 | Temp 97.2°F | Resp 18 | Wt 167.2 lb

## 2024-02-20 DIAGNOSIS — E538 Deficiency of other specified B group vitamins: Secondary | ICD-10-CM | POA: Diagnosis not present

## 2024-02-20 DIAGNOSIS — D500 Iron deficiency anemia secondary to blood loss (chronic): Secondary | ICD-10-CM

## 2024-02-20 DIAGNOSIS — T451X5A Adverse effect of antineoplastic and immunosuppressive drugs, initial encounter: Secondary | ICD-10-CM

## 2024-02-20 DIAGNOSIS — C7951 Secondary malignant neoplasm of bone: Secondary | ICD-10-CM | POA: Diagnosis not present

## 2024-02-20 DIAGNOSIS — G620 Drug-induced polyneuropathy: Secondary | ICD-10-CM | POA: Diagnosis not present

## 2024-02-20 DIAGNOSIS — C61 Malignant neoplasm of prostate: Secondary | ICD-10-CM

## 2024-02-20 MED ORDER — DENOSUMAB 120 MG/1.7ML ~~LOC~~ SOLN
120.0000 mg | Freq: Once | SUBCUTANEOUS | Status: AC
  Administered 2024-02-20: 120 mg via SUBCUTANEOUS
  Filled 2024-02-20: qty 1.7

## 2024-02-20 NOTE — Progress Notes (Signed)
 Patient is taking Nubeqa as prescribed. He has not missed any doses and reports no side effects at this time.

## 2024-02-20 NOTE — Patient Instructions (Signed)
 [facility] Discharge Instructions   You were seen and examined today by Dr. Davonna.  She reviewed the results of your lab work which are normal/stable.   We will proceed with your injection today.   Return as scheduled.    Thank you for choosing [facility] to provide your oncology and hematology care.  To afford each patient quality time with our provider, please arrive at least 15 minutes before your scheduled appointment time.   If you have a lab appointment with the Cancer Center please come in thru the Main Entrance and check in at the main information desk.  You need to re-schedule your appointment should you arrive 10 or more minutes late.  We strive to give you quality time with our providers, and arriving late affects you and other patients whose appointments are after yours.  Also, if you no show three or more times for appointments you may be dismissed from the clinic at the providers discretion.     Again, thank you for choosing [facility].  Our hope is that these requests will decrease the amount of time that you wait before being seen by our physicians.       _____________________________________________________________  Should you have questions after your visit to [facility], please contact our office at [phone] and follow the prompts.  Our office hours are 8:00 a.m. and 4:30 p.m. Monday - Friday.  Please note that voicemails left after 4:00 p.m. may not be returned until the following business day.  We are closed weekends and major holidays.  You do have access to a nurse 24-7, just call the main number to the clinic [phone] and do not press any options, hold on the line and a nurse will answer the phone.    For prescription refill requests, have your pharmacy contact our office and allow 72 hours.    Due to Covid, you will need to wear a mask upon entering the  hospital. If you do not have a mask, a mask will be given to you at the Main Entrance upon arrival. For doctor visits, patients may have 1 support person age 33 or older with them. For treatment visits, patients can not have anyone with them due to social distancing guidelines and our immunocompromised population.

## 2024-02-20 NOTE — Progress Notes (Signed)
 Carlos Harper presents today for injection per the provider's orders.  Xgeva  120 mg administration without incident; injection site WNL; see MAR for injection details.  Patient tolerated procedure well and without incident.  No questions or complaints noted at this time. Patient's Calcium noted to be 9.2 on 02/13/24.  Patient denies any tooth or jaw and no recent or future major dental appointments at this time. Patient reports taking Calcium/Vit D supplements as directed.  Discharged from clinic ambulatory in stable condition. Alert and oriented x 3. F/U with [facility] as scheduled.

## 2024-02-20 NOTE — Patient Instructions (Signed)
 [facility] [place] [facility]  Discharge Instructions: Thank you for choosing [facility] to provide your oncology and hematology care.  If you have a lab appointment with the Cancer Center - please note that after April 8th, 2024, all labs will be drawn in the cancer center.  You do not have to check in or register with the main entrance as you have in the past but will complete your check-in in the cancer center.  Wear comfortable clothing and clothing appropriate for easy access to any Portacath or PICC line.   We strive to give you quality time with your provider. You may need to reschedule your appointment if you arrive late (15 or more minutes).  Arriving late affects you and other patients whose appointments are after yours.  Also, if you miss three or more appointments without notifying the office, you may be dismissed from the clinic at the provider's discretion.      For prescription refill requests, have your pharmacy contact our office and allow 72 hours for refills to be completed.    Today you received Xgeva injection    BELOW ARE SYMPTOMS THAT SHOULD BE REPORTED IMMEDIATELY: *FEVER GREATER THAN 100.4 F (38 C) OR HIGHER *CHILLS OR SWEATING *NAUSEA AND VOMITING THAT IS NOT CONTROLLED WITH YOUR NAUSEA MEDICATION *UNUSUAL SHORTNESS OF BREATH *UNUSUAL BRUISING OR BLEEDING *URINARY PROBLEMS (pain or burning when urinating, or frequent urination) *BOWEL PROBLEMS (unusual diarrhea, constipation, pain near the anus) TENDERNESS IN MOUTH AND THROAT WITH OR WITHOUT PRESENCE OF ULCERS (sore throat, sores in mouth, or a toothache) UNUSUAL RASH, SWELLING OR PAIN  UNUSUAL VAGINAL DISCHARGE OR ITCHING   Items with * indicate a potential emergency and should be followed up as soon as possible or go to the Emergency Department if any problems should occur.  Please show the CHEMOTHERAPY ALERT CARD or IMMUNOTHERAPY ALERT CARD at check-in to  the Emergency Department and triage nurse.  Should you have questions after your visit or need to cancel or reschedule your appointment, please contact [facility] CANCER CTR [place] - A DEPT OF [facility] Bridegroom [facility] [phone]  and follow the prompts.  Office hours are 8:00 a.m. to 4:30 p.m. Monday - Friday. Please note that voicemails left after 4:00 p.m. may not be returned until the following business day.  We are closed weekends and major holidays. You have access to a nurse at all times for urgent questions. Please call the main number to the clinic [phone] and follow the prompts.  For any non-urgent questions, you may also contact your provider using MyChart. We now offer e-Visits for anyone 28 and older to request care online for non-urgent symptoms. For details visit [email or web address].   Also download the MyChart app! Go to the app store, search "MyChart", open the app, select [place], and log in with your MyChart username and password.

## 2024-02-20 NOTE — Progress Notes (Signed)
 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 male was transferred to my care after his prior physician has left.   ASSESSMENT & PLAN:   Assessment & Plan: Carlos Harper  is a 83 y.o. 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  S/p 6 cycles of docetaxel  with abiraterone  and prednisone .  Progressed on that and is currently on darolutamide  600 mg twice daily Recent PSMA bone lesions that were radiated.  - We reviewed labs today from 02/13/2024: CMP: Creatinine: 1.5-stable, normal LFTs, CBC: WBC: 3, hemoglobin: 11.7, platelets: 192, PSA: 1.35 - PSA trended down at this time.  Will repeat with next visit.  If PSA continues to trend up, can consider a PSMA PET at time again - Continue darolutamide  600 mg twice daily.  Return to clinic in 3 months with labs.  Bone metastasis Patient currently on denosumab  monthly.Calcium 9.2.  Will receive it today.  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  - Continue gabapentin  300 mg at night  CKD Creatinine stable at 1.5 today.  Hypertension Blood pressure today: 131/79 mmHg  - Continue amlodipine  5 mg daily   The patient understands the plans discussed today and is in agreement with them.  He knows to contact our office if he develops concerns prior to  his next appointment.  40 minutes of total time was spent for this patient encounter, including preparation,review of records,  face-to-face counseling with the patient and coordination of care, physical exam, and documentation of the encounter.    Carlos Harper Carlos Harper,acting as a neurosurgeon for Carlos Dry, MD.,have documented all relevant documentation on the behalf of Carlos Dry, MD,as directed by  Carlos Dry, MD while in the presence of Carlos Dry, MD.  I, Carlos Dry MD, have reviewed the above documentation for accuracy and completeness, and I agree with the above.    Carlos Harper  [facility] [place] - A DEPT OF [name] [address] [place] [address] Dept: [phone] Dept Fax: [phone]   No orders of the defined types were placed in this encounter.    ONCOLOGY HISTORY:   I have reviewed his chart and materials related to his cancer extensively and collaborated history with the patient. 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 There are also diffuse sclerotic bone metastases. High-grade right-sided obstructive uropathy to the level of the distal right ureter. Marked prostate gland enlargement. Diffuse posterior bladder wall thickening.  -09/12/2017: NM Body Scan: Extensive abnormal bone uptake noted in the shoulders, sternum, ribs, throughout the spine, pelvis and proximal to mid femurs compatible with extensive bony metastatic disease. -09/17/2017: Bilateral orchiectomy with prostate biopsy.  Pathology: Prostatic adenocarcinoma in 6/6  cores, Gleason score is 5+4=9. Testis negative for tumor.  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 Two foci of intense radiotracer activity within the T2 and T12 vertebral bodies consistent with active skeletal metastasis. Multifocal sclerotic lesions throughout the skeleton consistent with widespread skeletal metastasis. The vast majority of lesions do not have radiotracer activity. No evidence of nodal metastasis or visceral metastasis. -01/08/2024-01/15/2024: Radiation therapy to T2 bone lesion completed -02/13/2024: PSA 1.35  Current Treatment:  Darolutamide  600 mg twice daily  INTERVAL HISTORY:   Carlos Harper is here today for follow up and to establish care with me for metastatic prostate cancer. He is doing well overall. He has completed radiation therapy and reports it went well. He denies any pain, fevers, or chills.  He is taking Nubeqa  as prescribed and is receiving monthly denosumab  injections. He is not taking calcium or vitamin D  supplements as they cause bowel issues, and instead drinks orange juice.   Carlos Harper creatinine levels are slightly elevated today, which he attributes to not drinking an adequate amount of water .   I have reviewed the past medical history, past surgical history, social history and family history with the patient and they are unchanged from previous note.  ALLERGIES:  has no known allergies.  MEDICATIONS:  Current Outpatient Medications  Medication Sig Dispense Refill   amLODipine  (NORVASC ) 5 MG tablet TAKE 1 TABLET (5 MG TOTAL) BY MOUTH DAILY. 90 tablet 0   darolutamide  (NUBEQA ) 300 MG tablet Take 2 tablets (600 mg total) by mouth 2 (two) times daily with  a meal. 120 tablet 3   docusate sodium (COLACE) 100 MG capsule Take 100 mg by mouth daily.     gabapentin  (NEURONTIN ) 300 MG capsule TAKE 2 CAPSULES (600 MG TOTAL) BY MOUTH AT BEDTIME 60 capsule 2   No current facility-administered medications for this visit.   Facility-Administered Medications Ordered in Other Visits  Medication Dose Route Frequency Provider Last Rate Last Admin   lanreotide acetate  (SOMATULINE DEPOT ) 120 MG/0.5ML injection             REVIEW OF SYSTEMS:   Constitutional: Denies fevers, chills or abnormal weight loss Eyes: Denies blurriness of vision Ears, nose, mouth, throat, and face: Denies mucositis or sore throat Respiratory: Denies cough, dyspnea or wheezes Cardiovascular: Denies palpitation, chest discomfort or lower extremity swelling Gastrointestinal:  Denies nausea, heartburn or change in bowel habits Skin: Denies abnormal skin rashes Lymphatics: Denies new lymphadenopathy or easy bruising Neurological:Denies numbness, tingling or new weaknesses Behavioral/Psych: Mood is stable, no new changes  All other systems were reviewed with the patient and are negative.   VITALS:  There were no vitals taken for this visit.  Wt Readings from Last 3 Encounters:  01/23/24 165 lb 2 oz (74.9 kg)  11/28/23 166 lb 14.2 oz (75.7 kg)  09/26/23 166 lb 14.2 oz (75.7 kg)    There is no height or weight on file to calculate BMI.  Performance status (ECOG): 2 - Symptomatic, <50% confined to bed  PHYSICAL EXAM:   GENERAL:alert, no distress and comfortable SKIN: skin color, texture, turgor are normal, no rashes or significant lesions LYMPH:  no palpable lymphadenopathy in the cervical, axillary  or inguinal LUNGS: clear to auscultation and percussion with normal breathing effort HEART: regular rate & rhythm and no murmurs and no lower extremity edema ABDOMEN:abdomen soft, non-tender and normal bowel sounds Musculoskeletal:no cyanosis of digits and no clubbing  NEURO:  alert & oriented x 3 with fluent speech, no focal motor/sensory deficits  LABORATORY DATA:  I have reviewed the data as listed   Lab Results  Component Value Date   WBC 3.0 (L) 02/13/2024   NEUTROABS 1.6 (L) 02/13/2024   HGB 11.7 (L) 02/13/2024   HCT 35.0 (L) 02/13/2024   MCV 95.4 02/13/2024   PLT 192 02/13/2024     Chemistry      Component Value Date/Time   NA 140 02/13/2024 0930   K 3.9 02/13/2024 0930   CL 106 02/13/2024 0930   CO2 21 (L) 02/13/2024 0930   BUN 22 02/13/2024 0930   CREATININE 1.50 (H) 02/13/2024 0930      Component Value Date/Time   CALCIUM 9.2 02/13/2024 0930   ALKPHOS 57 02/13/2024 0930   AST 23 02/13/2024 0930   ALT 9 02/13/2024 0930   BILITOT 0.8 02/13/2024 0930       Latest Reference Range & Units 02/13/24 09:30  Prostatic Specific Antigen 0.00 - 4.00 ng/mL 1.35   RADIOGRAPHIC STUDIES: I have personally reviewed the radiological images as listed and agreed with the findings in the report.  NM PET (PSMA) SKULL TO MID THIGH CLINICAL DATA:  Prostate carcinoma.  Assess treatment response  EXAM: NUCLEAR MEDICINE PET SKULL BASE TO THIGH  TECHNIQUE: 8.4 mCi Flotufolastat (Posluma ) was injected intravenously. Full-ring PET imaging was performed from the skull base to thigh after the radiotracer. CT data was obtained and used for attenuation correction and anatomic localization.  COMPARISON:  Bone scan 2 17 2022, CT abdomen pelvis 09/12/2017  FINDINGS: NECK  Solid fossa in a  Incidental CT finding: None.  CHEST  No radiotracer accumulation within mediastinal or hilar lymph nodes. No suspicious pulmonary nodules on the CT scan.  Incidental CT finding: None.  ABDOMEN/PELVIS  Prostate: Mild low level radiotracer activity throughout the prostate gland is nonspecific.  Lymph nodes: No abnormal radiotracer accumulation within pelvic or abdominal nodes.  Liver: No evidence of liver metastasis.  Incidental CT finding: Multiple simple  fluid attenuation cysts within LEFT and RIGHT hepatic lobe.  SKELETON  There is multiple sclerotic lesions throughout the pelvis spine and ribs. Sclerotic lesions also within the proximal humeri and scapula.  On the background of this diffuse sclerotic pattern, there are 2 foci of intense radiotracer activity. One lesion within the T12 vertebral body on image 105 SUV max equal 25. Second lesion in the T2 vertebral body with SUV max equal 12.8 on image 57.  Faint activity in the RIGHT anterior rib with SUV on image 76 is indeterminate.  IMPRESSION: 1. Oligometastatic skeletal prostate carcinoma. Two foci of intense radiotracer activity within the T2 and T12 vertebral bodies consistent with active skeletal metastasis. 2. Multifocal sclerotic lesions throughout the skeleton consistent with widespread skeletal metastasis. The vast majority of lesions do not have radiotracer activity. 3. No evidence of nodal metastasis or visceral metastasis.  Electronically Signed   By: Jackquline Boxer M.D.   On: 11/22/2023 11:02

## 2024-02-21 ENCOUNTER — Inpatient Hospital Stay

## 2024-02-21 ENCOUNTER — Inpatient Hospital Stay: Admitting: Oncology

## 2024-02-22 ENCOUNTER — Other Ambulatory Visit: Payer: Self-pay | Admitting: Family Medicine

## 2024-02-24 DIAGNOSIS — C61 Malignant neoplasm of prostate: Secondary | ICD-10-CM | POA: Diagnosis not present

## 2024-02-24 DIAGNOSIS — C7951 Secondary malignant neoplasm of bone: Secondary | ICD-10-CM | POA: Diagnosis not present

## 2024-03-12 ENCOUNTER — Other Ambulatory Visit (HOSPITAL_COMMUNITY): Payer: Self-pay

## 2024-03-13 ENCOUNTER — Other Ambulatory Visit: Payer: Self-pay

## 2024-03-16 ENCOUNTER — Other Ambulatory Visit: Payer: Self-pay

## 2024-03-18 ENCOUNTER — Other Ambulatory Visit (HOSPITAL_COMMUNITY): Payer: Self-pay

## 2024-03-18 ENCOUNTER — Other Ambulatory Visit: Payer: Self-pay

## 2024-03-18 DIAGNOSIS — D500 Iron deficiency anemia secondary to blood loss (chronic): Secondary | ICD-10-CM

## 2024-03-18 DIAGNOSIS — C61 Malignant neoplasm of prostate: Secondary | ICD-10-CM

## 2024-03-18 NOTE — Progress Notes (Signed)
 Specialty Pharmacy Refill Coordination Note  Spoke with [name],[name] (Daughter)  Carlos Harper is a 83 y.o. male contacted today regarding refills of specialty medication(s) Darolutamide  (Nubeqa )  Doses on hand: Reported 9 tablets, WAM suggests 8 days  Patient requested: Delivery   Delivery date: 03/20/24   Verified address: [address] [place] [address]  Medication will be filled on 03/18/24

## 2024-03-23 ENCOUNTER — Inpatient Hospital Stay: Attending: Hematology

## 2024-03-23 VITALS — BP 124/66 | HR 71 | Temp 97.7°F | Resp 18

## 2024-03-23 DIAGNOSIS — C7951 Secondary malignant neoplasm of bone: Secondary | ICD-10-CM | POA: Insufficient documentation

## 2024-03-23 DIAGNOSIS — Z23 Encounter for immunization: Secondary | ICD-10-CM | POA: Insufficient documentation

## 2024-03-23 DIAGNOSIS — C61 Malignant neoplasm of prostate: Secondary | ICD-10-CM

## 2024-03-23 DIAGNOSIS — D500 Iron deficiency anemia secondary to blood loss (chronic): Secondary | ICD-10-CM

## 2024-03-23 LAB — COMPREHENSIVE METABOLIC PANEL WITH GFR
ALT: 10 U/L (ref 0–44)
AST: 23 U/L (ref 15–41)
Albumin: 4.1 g/dL (ref 3.5–5.0)
Alkaline Phosphatase: 61 U/L (ref 38–126)
Anion gap: 10 (ref 5–15)
BUN: 11 mg/dL (ref 8–23)
CO2: 22 mmol/L (ref 22–32)
Calcium: 9.1 mg/dL (ref 8.9–10.3)
Chloride: 108 mmol/L (ref 98–111)
Creatinine, Ser: 1.47 mg/dL — ABNORMAL HIGH (ref 0.61–1.24)
GFR, Estimated: 47 mL/min — ABNORMAL LOW
Glucose, Bld: 92 mg/dL (ref 70–99)
Potassium: 4.6 mmol/L (ref 3.5–5.1)
Sodium: 140 mmol/L (ref 135–145)
Total Bilirubin: 0.9 mg/dL (ref 0.0–1.2)
Total Protein: 7 g/dL (ref 6.5–8.1)

## 2024-03-23 MED ORDER — DENOSUMAB 120 MG/1.7ML ~~LOC~~ SOLN
120.0000 mg | Freq: Once | SUBCUTANEOUS | Status: AC
  Administered 2024-03-23: 120 mg via SUBCUTANEOUS
  Filled 2024-03-23: qty 1.7

## 2024-03-23 MED ORDER — INFLUENZA VAC SPLIT HIGH-DOSE 0.5 ML IM SUSY
0.5000 mL | PREFILLED_SYRINGE | Freq: Once | INTRAMUSCULAR | Status: AC
  Administered 2024-03-23: 0.5 mL via INTRAMUSCULAR

## 2024-03-23 NOTE — Patient Instructions (Signed)
 [facility] [place] - A DEPT OF [facility]  [facility]  Discharge Instructions: Thank you for choosing [facility] to provide your oncology and hematology care.  If you have a lab appointment with the Cancer Center - please note that after April 8th, 2024, all labs will be drawn in the cancer center.  You do not have to check in or register with the main entrance as you have in the past but will complete your check-in in the cancer center.  Wear comfortable clothing and clothing appropriate for easy access to any Portacath or PICC line.   We strive to give you quality time with your provider. You may need to reschedule your appointment if you arrive late (15 or more minutes).  Arriving late affects you and other patients whose appointments are after yours.  Also, if you miss three or more appointments without notifying the office, you may be dismissed from the clinic at the provider's discretion.      For prescription refill requests, have your pharmacy contact our office and allow 72 hours for refills to be completed.    Today you received the following chemotherapy and/or immunotherapy agents xgeva .Denosumab  Injection (Oncology) What is this medication? DENOSUMAB  (den oh SUE mab) prevents weakened bones caused by cancer. It may also be used to treat noncancerous bone tumors that cannot be removed by surgery. It can also be used to treat high calcium  levels in the blood caused by cancer. It works by blocking a protein that causes bones to break down quickly. This slows down the release of calcium  from bones, which lowers calcium  levels in your blood. It also makes your bones stronger and less likely to break (fracture). This medicine may be used for other purposes; ask your health care provider or pharmacist if you have questions. COMMON BRAND NAME(S): XGEVA  What should I tell my care team before I take this medication? They need to know if you have any of these  conditions: Dental disease Having surgery or tooth extraction Infection Kidney disease Low levels of calcium  or vitamin D in the blood Malnutrition On hemodialysis Skin conditions or sensitivity Thyroid or parathyroid disease An unusual reaction to denosumab , other medications, foods, dyes, or preservatives Pregnant or trying to get pregnant Breast-feeding How should I use this medication? This medication is for injection under the skin. It is given by your care team in a hospital or clinic setting. A special MedGuide will be given to you before each treatment. Be sure to read this information carefully each time. Talk to your care team about the use of this medication in children. While it may be prescribed for children as young as 13 years for selected conditions, precautions do apply. Overdosage: If you think you have taken too much of this medicine contact a poison control center or emergency room at once. NOTE: This medicine is only for you. Do not share this medicine with others. What if I miss a dose? Keep appointments for follow-up doses. It is important not to miss your dose. Call your care team if you are unable to keep an appointment. What may interact with this medication? Do not take this medication with any of the following: Other medications containing denosumab  This medication may also interact with the following: Medications that lower your chance of fighting infection Steroid medications, such as prednisone  or cortisone This list may not describe all possible interactions. Give your health care provider a list of all the medicines, herbs,  non-prescription drugs, or dietary supplements you use. Also tell them if you smoke, drink alcohol, or use illegal drugs. Some items may interact with your medicine. What should I watch for while using this medication? Your condition will be monitored carefully while you are receiving this medication. You may need blood work while  taking this medication. This medication may increase your risk of getting an infection. Call your care team for advice if you get a fever, chills, sore throat, or other symptoms of a cold or flu. Do not treat yourself. Try to avoid being around people who are sick. You should make sure you get enough calcium  and vitamin D while you are taking this medication, unless your care team tells you not to. Discuss the foods you eat and the vitamins you take with your care team. Some people who take this medication have severe bone, joint, or muscle pain. This medication may also increase your risk for jaw problems or a broken thigh bone. Tell your care team right away if you have severe pain in your jaw, bones, joints, or muscles. Tell your care team if you have any pain that does not go away or that gets worse. Talk to your care team if you may be pregnant. Serious birth defects can occur if you take this medication during pregnancy and for 5 months after the last dose. You will need a negative pregnancy test before starting this medication. Contraception is recommended while taking this medication and for 5 months after the last dose. Your care team can help you find the option that works for you. What side effects may I notice from receiving this medication? Side effects that you should report to your care team as soon as possible: Allergic reactions--skin rash, itching, hives, swelling of the face, lips, tongue, or throat Bone, joint, or muscle pain Low calcium  level--muscle pain or cramps, confusion, tingling, or numbness in the hands or feet Osteonecrosis of the jaw--pain, swelling, or redness in the mouth, numbness of the jaw, poor healing after dental work, unusual discharge from the mouth, visible bones in the mouth Side effects that usually do not require medical attention (report to your care team if they continue or are bothersome): Cough Diarrhea Fatigue Headache Nausea This list may not  describe all possible side effects. Call your doctor for medical advice about side effects. You may report side effects to FDA at 1-800-FDA-1088. Where should I keep my medication? This medication is given in a hospital or clinic. It will not be stored at home. NOTE: This sheet is a summary. It may not cover all possible information. If you have questions about this medicine, talk to your doctor, pharmacist, or health care provider.  2024 Elsevier/Gold Standard (2021-08-30 00:00:00)       To help prevent nausea and vomiting after your treatment, we encourage you to take your nausea medication as directed.  BELOW ARE SYMPTOMS THAT SHOULD BE REPORTED IMMEDIATELY: *FEVER GREATER THAN 100.4 F (38 C) OR HIGHER *CHILLS OR SWEATING *NAUSEA AND VOMITING THAT IS NOT CONTROLLED WITH YOUR NAUSEA MEDICATION *UNUSUAL SHORTNESS OF BREATH *UNUSUAL BRUISING OR BLEEDING *URINARY PROBLEMS (pain or burning when urinating, or frequent urination) *BOWEL PROBLEMS (unusual diarrhea, constipation, pain near the anus) TENDERNESS IN MOUTH AND THROAT WITH OR WITHOUT PRESENCE OF ULCERS (sore throat, sores in mouth, or a toothache) UNUSUAL RASH, SWELLING OR PAIN  UNUSUAL VAGINAL DISCHARGE OR ITCHING   Items with * indicate a potential emergency and should be followed up as soon  as possible or go to the Emergency Department if any problems should occur.  Please show the CHEMOTHERAPY ALERT CARD or IMMUNOTHERAPY ALERT CARD at check-in to the Emergency Department and triage nurse.  Should you have questions after your visit or need to cancel or reschedule your appointment, please contact [facility] [place] - A DEPT OF [facility] [phone]  and follow the prompts.  Office hours are 8:00 a.m. to 4:30 p.m. Monday - Friday. Please note that voicemails left after 4:00 p.m. may not be returned until the following business day.  We are closed weekends and major holidays. You have access to a nurse  at all times for urgent questions. Please call the main number to the clinic [phone] and follow the prompts.  For any non-urgent questions, you may also contact your provider using MyChart. We now offer e-Visits for anyone 28 and older to request care online for non-urgent symptoms. For details visit [email or web address].   Also download the MyChart app! Go to the app store, search MyChart, open the app, select [place], and log in with your MyChart username and password.

## 2024-03-23 NOTE — Progress Notes (Signed)
 Patient presents today for Xgeva  injection. Calcium 9.1. Patient tolerated injection in left abd with no complaints voiced.  Site clean and dry with no bruising or swelling noted.  Patient also requested Flu vaccine when asked. Patient tolerated flu vaccine in left deltoid with no complaints voiced.  Site clean and dry with no bruising or swelling noted.  No complaints of pain.  Discharged with vital signs stable and no signs or symptoms of distress noted.

## 2024-04-13 ENCOUNTER — Other Ambulatory Visit: Payer: Self-pay

## 2024-04-14 ENCOUNTER — Other Ambulatory Visit: Payer: Self-pay

## 2024-04-15 ENCOUNTER — Other Ambulatory Visit: Payer: Self-pay

## 2024-04-15 DIAGNOSIS — C61 Malignant neoplasm of prostate: Secondary | ICD-10-CM

## 2024-04-17 ENCOUNTER — Other Ambulatory Visit: Payer: Self-pay

## 2024-04-17 ENCOUNTER — Other Ambulatory Visit (HOSPITAL_COMMUNITY): Payer: Self-pay

## 2024-04-17 NOTE — Progress Notes (Signed)
 Specialty Pharmacy Ongoing Clinical Assessment Note  Xayne Brumbaugh is a 83 y.o. 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   Therapeutic benefit summary: Patient is achieving benefit   Adverse events/side effects summary: No adverse events/side effects   Patient's therapy is appropriate to: Continue    Goals Addressed             This Visit's Progress    Slow Disease Progression   On track    Patient is on track. Patient will maintain adherence, adhere to provider and/or lab appointments, and be monitored by provider to determine if a change in treatment plan is warranted. Patient's PSA has decreased to 1.35 ng/mL as of 02/13/24         Follow up: 3 months  [facility] Specialty Pharmacist

## 2024-04-17 NOTE — Progress Notes (Signed)
 Specialty Pharmacy Refill Coordination Note  Carlos Harper is a 83 y.o. male contacted today regarding refills of specialty medication(s) Darolutamide  (Nubeqa )   Patient requested Delivery   Delivery date: 04/22/24   Verified address: [address] [place] [address]   Medication will be filled on: 04/21/24

## 2024-04-20 ENCOUNTER — Inpatient Hospital Stay

## 2024-04-20 ENCOUNTER — Encounter: Payer: Self-pay | Admitting: *Deleted

## 2024-04-20 ENCOUNTER — Other Ambulatory Visit (HOSPITAL_COMMUNITY): Payer: Self-pay

## 2024-04-21 ENCOUNTER — Other Ambulatory Visit: Payer: Self-pay

## 2024-04-21 ENCOUNTER — Inpatient Hospital Stay

## 2024-04-21 VITALS — BP 138/78 | HR 54 | Temp 97.3°F | Resp 16

## 2024-04-21 DIAGNOSIS — C61 Malignant neoplasm of prostate: Secondary | ICD-10-CM

## 2024-04-21 LAB — COMPREHENSIVE METABOLIC PANEL WITH GFR
ALT: 8 U/L (ref 0–44)
AST: 16 U/L (ref 15–41)
Albumin: 4.4 g/dL (ref 3.5–5.0)
Alkaline Phosphatase: 59 U/L (ref 38–126)
Anion gap: 7 (ref 5–15)
BUN: 14 mg/dL (ref 8–23)
CO2: 28 mmol/L (ref 22–32)
Calcium: 9.1 mg/dL (ref 8.9–10.3)
Chloride: 105 mmol/L (ref 98–111)
Creatinine, Ser: 1.4 mg/dL — ABNORMAL HIGH (ref 0.61–1.24)
GFR, Estimated: 50 mL/min — ABNORMAL LOW
Glucose, Bld: 95 mg/dL (ref 70–99)
Potassium: 4.5 mmol/L (ref 3.5–5.1)
Sodium: 139 mmol/L (ref 135–145)
Total Bilirubin: 0.7 mg/dL (ref 0.0–1.2)
Total Protein: 7.3 g/dL (ref 6.5–8.1)

## 2024-04-21 MED ORDER — DENOSUMAB 120 MG/1.7ML ~~LOC~~ SOLN
120.0000 mg | Freq: Once | SUBCUTANEOUS | Status: AC
  Administered 2024-04-21: 120 mg via SUBCUTANEOUS
  Filled 2024-04-21: qty 1.7

## 2024-04-21 NOTE — Patient Instructions (Signed)
 [facility] [place] - [facility]  Discharge Instructions: Thank you for choosing [facility] to provide your oncology and hematology care.  If you have a lab appointment with the Cancer Center - please note that after April 8th, 2024, all labs will be drawn in the cancer center.  You do not have to check in or register with the main entrance as you have in the past but will complete your check-in in the cancer center.  Wear comfortable clothing and clothing appropriate for easy access to any Portacath or PICC line.   We strive to give you quality time with your provider. You may need to reschedule your appointment if you arrive late (15 or more minutes).  Arriving late affects you and other patients whose appointments are after yours.  Also, if you miss three or more appointments without notifying the office, you may be dismissed from the clinic at the provider's discretion.      For prescription refill requests, have your pharmacy contact our office and allow 72 hours for refills to be completed.    Today you received the following  Xgeva, return as scheduled.   To help prevent nausea and vomiting after your treatment, we encourage you to take your nausea medication as directed.  BELOW ARE SYMPTOMS THAT SHOULD BE REPORTED IMMEDIATELY: *FEVER GREATER THAN 100.4 F (38 C) OR HIGHER *CHILLS OR SWEATING *NAUSEA AND VOMITING THAT IS NOT CONTROLLED WITH YOUR NAUSEA MEDICATION *UNUSUAL SHORTNESS OF BREATH *UNUSUAL BRUISING OR BLEEDING *URINARY PROBLEMS (pain or burning when urinating, or frequent urination) *BOWEL PROBLEMS (unusual diarrhea, constipation, pain near the anus) TENDERNESS IN MOUTH AND THROAT WITH OR WITHOUT PRESENCE OF ULCERS (sore throat, sores in mouth, or a toothache) UNUSUAL RASH, SWELLING OR PAIN  UNUSUAL VAGINAL DISCHARGE OR ITCHING   Items with * indicate a potential emergency and should be followed up as soon as possible or  go to the Emergency Department if any problems should occur.  Please show the CHEMOTHERAPY ALERT CARD or IMMUNOTHERAPY ALERT CARD at check-in to the Emergency Department and triage nurse.  Should you have questions after your visit or need to cancel or reschedule your appointment, please contact [facility]CER [facility] [place] - A DEPT OF [facility] [phone]  and follow the prompts.  Office hours are 8:00 a.m. to 4:30 p.m. Monday - Friday. Please note that voicemails left after 4:00 p.m. may not be returned until the following business day.  We are closed weekends and major holidays. You have access to a nurse at all times for urgent questions. Please call the main number to the clinic [phone] and follow the prompts.  For any non-urgent questions, you may also contact your provider using MyChart. We now offer e-Visits for anyone 56 and older to request care online for non-urgent symptoms. For details visit [email or web address].   Also download the MyChart app! Go to the app store, search "MyChart", open the app, select [place], and log in with your MyChart username and password.

## 2024-04-21 NOTE — Progress Notes (Signed)
Patient taking calcium as directed. Denied tooth, jaw, and leg pain. No recent or upcoming dental visits. Labs reviewed. Patient tolerated injection with no complaints voiced. See MAR for details. Patient stable during and after injection. Site clean and dry with no bruising or swelling noted. Band aid applied. Vss with discharge and left in satisfactory condition with no s/s of distress.  

## 2024-05-05 ENCOUNTER — Other Ambulatory Visit: Payer: Self-pay | Admitting: *Deleted

## 2024-05-05 MED ORDER — GABAPENTIN 300 MG PO CAPS: 600.0000 mg | ORAL_CAPSULE | Freq: Every day | ORAL | 2 refills | Status: AC

## 2024-05-11 ENCOUNTER — Inpatient Hospital Stay: Attending: Hematology

## 2024-05-11 DIAGNOSIS — D500 Iron deficiency anemia secondary to blood loss (chronic): Secondary | ICD-10-CM

## 2024-05-11 DIAGNOSIS — D631 Anemia in chronic kidney disease: Secondary | ICD-10-CM | POA: Diagnosis not present

## 2024-05-11 DIAGNOSIS — C61 Malignant neoplasm of prostate: Secondary | ICD-10-CM | POA: Insufficient documentation

## 2024-05-11 DIAGNOSIS — C7951 Secondary malignant neoplasm of bone: Secondary | ICD-10-CM | POA: Insufficient documentation

## 2024-05-11 DIAGNOSIS — E538 Deficiency of other specified B group vitamins: Secondary | ICD-10-CM | POA: Diagnosis not present

## 2024-05-11 DIAGNOSIS — N189 Chronic kidney disease, unspecified: Secondary | ICD-10-CM | POA: Diagnosis not present

## 2024-05-11 DIAGNOSIS — E611 Iron deficiency: Secondary | ICD-10-CM | POA: Diagnosis not present

## 2024-05-11 LAB — CBC WITH DIFFERENTIAL/PLATELET
Abs Immature Granulocytes: 0 K/uL (ref 0.00–0.07)
Basophils Absolute: 0 K/uL (ref 0.0–0.1)
Basophils Relative: 1 %
Eosinophils Absolute: 0 K/uL (ref 0.0–0.5)
Eosinophils Relative: 0 %
HCT: 33.7 % — ABNORMAL LOW (ref 39.0–52.0)
Hemoglobin: 11.2 g/dL — ABNORMAL LOW (ref 13.0–17.0)
Immature Granulocytes: 0 %
Lymphocytes Relative: 34 %
Lymphs Abs: 1.1 K/uL (ref 0.7–4.0)
MCH: 31.6 pg (ref 26.0–34.0)
MCHC: 33.2 g/dL (ref 30.0–36.0)
MCV: 95.2 fL (ref 80.0–100.0)
Monocytes Absolute: 0.3 K/uL (ref 0.1–1.0)
Monocytes Relative: 10 %
Neutro Abs: 1.7 K/uL (ref 1.7–7.7)
Neutrophils Relative %: 55 %
Platelets: 213 K/uL (ref 150–400)
RBC: 3.54 MIL/uL — ABNORMAL LOW (ref 4.22–5.81)
RDW: 12.7 % (ref 11.5–15.5)
WBC: 3.2 K/uL — ABNORMAL LOW (ref 4.0–10.5)
nRBC: 0 % (ref 0.0–0.2)

## 2024-05-11 LAB — IRON AND TIBC
Iron: 70 ug/dL (ref 45–182)
Saturation Ratios: 22 % (ref 17.9–39.5)
TIBC: 322 ug/dL (ref 250–450)
UIBC: 252 ug/dL

## 2024-05-11 LAB — PSA: Prostatic Specific Antigen: 0.31 ng/mL (ref 0.00–4.00)

## 2024-05-11 LAB — VITAMIN B12: Vitamin B-12: 1064 pg/mL — ABNORMAL HIGH (ref 180–914)

## 2024-05-11 LAB — COMPREHENSIVE METABOLIC PANEL WITH GFR
ALT: 6 U/L (ref 0–44)
AST: 16 U/L (ref 15–41)
Albumin: 4.3 g/dL (ref 3.5–5.0)
Alkaline Phosphatase: 59 U/L (ref 38–126)
Anion gap: 13 (ref 5–15)
BUN: 20 mg/dL (ref 8–23)
CO2: 21 mmol/L — ABNORMAL LOW (ref 22–32)
Calcium: 9.5 mg/dL (ref 8.9–10.3)
Chloride: 105 mmol/L (ref 98–111)
Creatinine, Ser: 1.46 mg/dL — ABNORMAL HIGH (ref 0.61–1.24)
GFR, Estimated: 47 mL/min — ABNORMAL LOW
Glucose, Bld: 83 mg/dL (ref 70–99)
Potassium: 4.2 mmol/L (ref 3.5–5.1)
Sodium: 140 mmol/L (ref 135–145)
Total Bilirubin: 0.8 mg/dL (ref 0.0–1.2)
Total Protein: 7.4 g/dL (ref 6.5–8.1)

## 2024-05-11 LAB — FOLATE: Folate: 6.2 ng/mL

## 2024-05-11 LAB — FERRITIN: Ferritin: 391 ng/mL — ABNORMAL HIGH (ref 24–336)

## 2024-05-13 ENCOUNTER — Other Ambulatory Visit: Payer: Self-pay | Admitting: Pharmacy Technician

## 2024-05-13 ENCOUNTER — Other Ambulatory Visit: Payer: Self-pay

## 2024-05-13 ENCOUNTER — Telehealth: Payer: Self-pay | Admitting: Pharmacy Technician

## 2024-05-13 ENCOUNTER — Other Ambulatory Visit (HOSPITAL_COMMUNITY): Payer: Self-pay

## 2024-05-13 NOTE — Progress Notes (Signed)
 Oral Oncology Patient Advocate Encounter  PA renewal has been approved.  Jillann Charette ([name]) [name], CPhT  [facility], [name], [facility] - Oral Chemotherapy Patient Advocate Specialist III Phone: [phone]  Fax: [phone]

## 2024-05-13 NOTE — Telephone Encounter (Signed)
 Oral Oncology Patient Advocate Encounter  Prior Authorization for Nubeqa  has been approved.    PA# [id number] Effective dates: 05/13/2024 through 05/13/2025  Patient can continue to fill through [facility].   Carlos Harper ([name]) [name], CPhT  [name], [name], [facility] - Oral Chemotherapy Patient Advocate Specialist III Phone: [phone]  Fax: [phone]

## 2024-05-13 NOTE — Telephone Encounter (Signed)
 Oral Oncology Patient Advocate Encounter   Re-authorization   Received notification that prior authorization for Nubeqa  is required.   PA submitted on CMM via Latent Key [id number] Status is pending     Charly Hunton ([name]) [name], CPhT  [facility], [name], [facility] - Oral Chemotherapy Patient Advocate Specialist III Phone: [phone]  Fax: [phone]

## 2024-05-14 ENCOUNTER — Other Ambulatory Visit: Payer: Self-pay

## 2024-05-15 ENCOUNTER — Inpatient Hospital Stay

## 2024-05-15 VITALS — BP 135/80 | HR 82 | Temp 97.8°F | Resp 16

## 2024-05-15 DIAGNOSIS — C61 Malignant neoplasm of prostate: Secondary | ICD-10-CM | POA: Diagnosis not present

## 2024-05-15 MED ORDER — DENOSUMAB 120 MG/1.7ML ~~LOC~~ SOLN
120.0000 mg | Freq: Once | SUBCUTANEOUS | Status: AC
  Administered 2024-05-15: 120 mg via SUBCUTANEOUS
  Filled 2024-05-15: qty 1.7

## 2024-05-15 NOTE — Patient Instructions (Signed)
 [facility] [place] - [facility]  Discharge Instructions: Thank you for choosing [facility] to provide your oncology and hematology care.  If you have a lab appointment with the Cancer Center - please note that after April 8th, 2024, all labs will be drawn in the cancer center.  You do not have to check in or register with the main entrance as you have in the past but will complete your check-in in the cancer center.  Wear comfortable clothing and clothing appropriate for easy access to any Portacath or PICC line.   We strive to give you quality time with your provider. You may need to reschedule your appointment if you arrive late (15 or more minutes).  Arriving late affects you and other patients whose appointments are after yours.  Also, if you miss three or more appointments without notifying the office, you may be dismissed from the clinic at the provider's discretion.      For prescription refill requests, have your pharmacy contact our office and allow 72 hours for refills to be completed.    Today you received the following  Xgeva, return as scheduled.   To help prevent nausea and vomiting after your treatment, we encourage you to take your nausea medication as directed.  BELOW ARE SYMPTOMS THAT SHOULD BE REPORTED IMMEDIATELY: *FEVER GREATER THAN 100.4 F (38 C) OR HIGHER *CHILLS OR SWEATING *NAUSEA AND VOMITING THAT IS NOT CONTROLLED WITH YOUR NAUSEA MEDICATION *UNUSUAL SHORTNESS OF BREATH *UNUSUAL BRUISING OR BLEEDING *URINARY PROBLEMS (pain or burning when urinating, or frequent urination) *BOWEL PROBLEMS (unusual diarrhea, constipation, pain near the anus) TENDERNESS IN MOUTH AND THROAT WITH OR WITHOUT PRESENCE OF ULCERS (sore throat, sores in mouth, or a toothache) UNUSUAL RASH, SWELLING OR PAIN  UNUSUAL VAGINAL DISCHARGE OR ITCHING   Items with * indicate a potential emergency and should be followed up as soon as possible or  go to the Emergency Department if any problems should occur.  Please show the CHEMOTHERAPY ALERT CARD or IMMUNOTHERAPY ALERT CARD at check-in to the Emergency Department and triage nurse.  Should you have questions after your visit or need to cancel or reschedule your appointment, please contact [facility]CER [facility] [place] - A DEPT OF [facility] [phone]  and follow the prompts.  Office hours are 8:00 a.m. to 4:30 p.m. Monday - Friday. Please note that voicemails left after 4:00 p.m. may not be returned until the following business day.  We are closed weekends and major holidays. You have access to a nurse at all times for urgent questions. Please call the main number to the clinic [phone] and follow the prompts.  For any non-urgent questions, you may also contact your provider using MyChart. We now offer e-Visits for anyone 56 and older to request care online for non-urgent symptoms. For details visit [email or web address].   Also download the MyChart app! Go to the app store, search "MyChart", open the app, select [place], and log in with your MyChart username and password.

## 2024-05-15 NOTE — Progress Notes (Signed)
Patient taking calcium as directed. Denied tooth, jaw, and leg pain. No recent or upcoming dental visits. Labs reviewed. Patient tolerated injection with no complaints voiced. See MAR for details. Patient stable during and after injection. Site clean and dry with no bruising or swelling noted. Band aid applied. Vss with discharge and left in satisfactory condition with no s/s of distress.  

## 2024-05-15 NOTE — Progress Notes (Signed)
 This pt's Xgeva  appt has been moved from 1/26 to 1/23 d/t potential for inclement weather.  Kathyjo Briere, PharmD, MBA

## 2024-05-18 ENCOUNTER — Inpatient Hospital Stay: Admitting: Oncology

## 2024-05-18 ENCOUNTER — Other Ambulatory Visit: Payer: Self-pay

## 2024-05-18 ENCOUNTER — Inpatient Hospital Stay

## 2024-05-18 NOTE — Progress Notes (Signed)
 Specialty Pharmacy Refill Coordination Note  Steward Sames is a 84 y.o. male contacted today regarding refills of specialty medication(s) Darolutamide  (Nubeqa )   Patient requested Delivery   Delivery date: 05/27/24   Verified address: [address] [place] [address]   Medication will be filled on: 05/26/24    05/18/24-Spoke with Naomi [name] (Daughter)

## 2024-05-20 ENCOUNTER — Other Ambulatory Visit: Payer: Self-pay | Admitting: Family Medicine

## 2024-05-22 ENCOUNTER — Other Ambulatory Visit: Payer: Self-pay

## 2024-05-25 ENCOUNTER — Other Ambulatory Visit: Payer: Self-pay

## 2024-05-26 ENCOUNTER — Other Ambulatory Visit: Payer: Self-pay

## 2024-06-15 ENCOUNTER — Inpatient Hospital Stay

## 2024-06-16 ENCOUNTER — Inpatient Hospital Stay

## 2024-06-16 ENCOUNTER — Inpatient Hospital Stay: Attending: Hematology | Admitting: Oncology
